# Patient Record
Sex: Male | Born: 1935 | Race: Black or African American | Hispanic: No | Marital: Married | Smoking: Former smoker
Health system: Southern US, Community
[De-identification: ages and names within clinical notes are randomized; demographics above are authoritative.]

## PROBLEM LIST (undated history)

## (undated) DIAGNOSIS — G3184 Mild cognitive impairment, so stated: Secondary | ICD-10-CM

## (undated) DIAGNOSIS — R011 Cardiac murmur, unspecified: Secondary | ICD-10-CM

## (undated) DIAGNOSIS — R569 Unspecified convulsions: Secondary | ICD-10-CM

## (undated) DIAGNOSIS — I10 Essential (primary) hypertension: Secondary | ICD-10-CM

## (undated) DIAGNOSIS — E785 Hyperlipidemia, unspecified: Secondary | ICD-10-CM

## (undated) HISTORY — PX: KNEE ARTHROSCOPY: SHX127

## (undated) HISTORY — DX: Essential (primary) hypertension: I10

## (undated) HISTORY — DX: Unspecified convulsions: R56.9

## (undated) HISTORY — DX: Hyperlipidemia, unspecified: E78.5

## (undated) HISTORY — DX: Mild cognitive impairment, so stated: G31.84

## (undated) HISTORY — PX: CARPAL TUNNEL RELEASE: SHX101

---

## 2001-12-09 ENCOUNTER — Encounter: Payer: Self-pay | Admitting: Internal Medicine

## 2001-12-09 LAB — CONVERTED CEMR LAB

## 2004-07-15 ENCOUNTER — Ambulatory Visit: Payer: Self-pay | Admitting: Internal Medicine

## 2005-01-19 ENCOUNTER — Ambulatory Visit: Payer: Self-pay | Admitting: Internal Medicine

## 2005-04-27 ENCOUNTER — Ambulatory Visit: Payer: Self-pay | Admitting: Internal Medicine

## 2005-07-01 ENCOUNTER — Ambulatory Visit: Payer: Self-pay | Admitting: Internal Medicine

## 2005-12-29 ENCOUNTER — Ambulatory Visit: Payer: Self-pay | Admitting: Internal Medicine

## 2006-01-05 ENCOUNTER — Ambulatory Visit: Payer: Self-pay | Admitting: Internal Medicine

## 2006-01-24 ENCOUNTER — Ambulatory Visit: Payer: Self-pay | Admitting: Gastroenterology

## 2006-02-01 ENCOUNTER — Ambulatory Visit: Payer: Self-pay | Admitting: Gastroenterology

## 2006-02-01 ENCOUNTER — Encounter: Payer: Self-pay | Admitting: Internal Medicine

## 2006-02-01 LAB — HM COLONOSCOPY

## 2006-05-11 ENCOUNTER — Ambulatory Visit: Payer: Self-pay | Admitting: Internal Medicine

## 2006-06-20 ENCOUNTER — Ambulatory Visit: Payer: Self-pay | Admitting: Internal Medicine

## 2006-06-20 LAB — CONVERTED CEMR LAB
ALT: 22 units/L (ref 0–40)
AST: 26 units/L (ref 0–37)
Albumin: 3.9 g/dL (ref 3.5–5.2)
Alkaline Phosphatase: 86 units/L (ref 39–117)
Bilirubin, Direct: 0.2 mg/dL (ref 0.0–0.3)
Chol/HDL Ratio, serum: 3.7
Cholesterol: 216 mg/dL (ref 0–200)
HDL: 58 mg/dL (ref >39.0)
LDL DIRECT: 121.9 mg/dL
Total Bilirubin: 0.8 mg/dL (ref 0.3–1.2)
Total Protein: 7.1 g/dL (ref 6.0–8.3)
Triglyceride fasting, serum: 104 mg/dL (ref 0–149)
VLDL: 21 mg/dL (ref 0–40)

## 2006-08-01 ENCOUNTER — Ambulatory Visit: Payer: Self-pay | Admitting: Internal Medicine

## 2006-08-01 LAB — CONVERTED CEMR LAB
ALT: 16 units/L (ref 0–40)
AST: 25 units/L (ref 0–37)
Albumin: 3.9 g/dL (ref 3.5–5.2)
Alkaline Phosphatase: 81 units/L (ref 39–117)
BUN: 11 mg/dL (ref 6–23)
Bilirubin, Direct: 0.1 mg/dL (ref 0.0–0.3)
CO2: 31 meq/L (ref 19–32)
Calcium: 9.6 mg/dL (ref 8.4–10.5)
Chloride: 103 meq/L (ref 96–112)
Cholesterol: 248 mg/dL (ref 0–200)
Creatinine, Ser: 1.1 mg/dL (ref 0.4–1.5)
Direct LDL: 108.7 mg/dL
GFR calc Af Amer: 85 mL/min
GFR calc non Af Amer: 70 mL/min
Glucose, Bld: 91 mg/dL (ref 70–99)
HDL: 53.1 mg/dL (ref >39.0)
Potassium: 5.3 meq/L — ABNORMAL HIGH (ref 3.5–5.1)
Sodium: 141 meq/L (ref 135–145)
Total Bilirubin: 0.7 mg/dL (ref 0.3–1.2)
Total CHOL/HDL Ratio: 4.7
Total Protein: 7.1 g/dL (ref 6.0–8.3)
Triglycerides: 294 mg/dL (ref 0–149)
VLDL: 59 mg/dL — ABNORMAL HIGH (ref 0–40)

## 2006-12-05 ENCOUNTER — Ambulatory Visit: Payer: Self-pay | Admitting: Internal Medicine

## 2006-12-05 LAB — CONVERTED CEMR LAB
ALT: 17 units/L (ref 0–40)
AST: 23 units/L (ref 0–37)
Albumin: 3.7 g/dL (ref 3.5–5.2)
Alkaline Phosphatase: 64 units/L (ref 39–117)
BUN: 9 mg/dL (ref 6–23)
Bilirubin, Direct: 0.1 mg/dL (ref 0.0–0.3)
CO2: 31 meq/L (ref 19–32)
Calcium: 9.2 mg/dL (ref 8.4–10.5)
Chloride: 106 meq/L (ref 96–112)
Cholesterol: 237 mg/dL (ref 0–200)
Creatinine, Ser: 1.1 mg/dL (ref 0.4–1.5)
Direct LDL: 122.9 mg/dL
GFR calc Af Amer: 85 mL/min
GFR calc non Af Amer: 70 mL/min
Glucose, Bld: 95 mg/dL (ref 70–99)
HDL: 56.1 mg/dL (ref >39.0)
Potassium: 5.3 meq/L — ABNORMAL HIGH (ref 3.5–5.1)
Sodium: 141 meq/L (ref 135–145)
Total Bilirubin: 0.8 mg/dL (ref 0.3–1.2)
Total CHOL/HDL Ratio: 4.2
Total Protein: 6.4 g/dL (ref 6.0–8.3)
Triglycerides: 179 mg/dL — ABNORMAL HIGH (ref 0–149)
VLDL: 36 mg/dL (ref 0–40)

## 2007-02-22 ENCOUNTER — Encounter: Payer: Self-pay | Admitting: Internal Medicine

## 2007-02-22 DIAGNOSIS — I10 Essential (primary) hypertension: Secondary | ICD-10-CM

## 2007-02-22 DIAGNOSIS — E785 Hyperlipidemia, unspecified: Secondary | ICD-10-CM

## 2007-02-22 HISTORY — DX: Essential (primary) hypertension: I10

## 2007-02-22 HISTORY — DX: Hyperlipidemia, unspecified: E78.5

## 2007-06-05 ENCOUNTER — Ambulatory Visit: Payer: Self-pay | Admitting: Internal Medicine

## 2007-06-05 LAB — CONVERTED CEMR LAB
ALT: 15 units/L (ref 0–53)
AST: 22 units/L (ref 0–37)
Albumin: 3.9 g/dL (ref 3.5–5.2)
Alkaline Phosphatase: 66 units/L (ref 39–117)
BUN: 17 mg/dL (ref 6–23)
Bilirubin, Direct: 0.1 mg/dL (ref 0.0–0.3)
CO2: 31 meq/L (ref 19–32)
Calcium: 9.5 mg/dL (ref 8.4–10.5)
Chloride: 105 meq/L (ref 96–112)
Cholesterol: 241 mg/dL (ref 0–200)
Creatinine, Ser: 1 mg/dL (ref 0.4–1.5)
Direct LDL: 150.5 mg/dL
GFR calc Af Amer: 94 mL/min
GFR calc non Af Amer: 78 mL/min
Glucose, Bld: 86 mg/dL (ref 70–99)
HDL: 64.4 mg/dL (ref >39.0)
Potassium: 5.5 meq/L — ABNORMAL HIGH (ref 3.5–5.1)
Sodium: 142 meq/L (ref 135–145)
Total Bilirubin: 0.8 mg/dL (ref 0.3–1.2)
Total CHOL/HDL Ratio: 3.7
Total Protein: 7 g/dL (ref 6.0–8.3)
Triglycerides: 87 mg/dL (ref 0–149)
VLDL: 17 mg/dL (ref 0–40)

## 2007-12-05 ENCOUNTER — Ambulatory Visit: Payer: Self-pay | Admitting: Internal Medicine

## 2007-12-24 LAB — CONVERTED CEMR LAB
ALT: 20 units/L (ref 0–53)
AST: 24 units/L (ref 0–37)
Bilirubin, Direct: 0.1 mg/dL (ref 0.0–0.3)
Total CHOL/HDL Ratio: 4.3
VLDL: 20 mg/dL (ref 0–40)

## 2008-05-30 ENCOUNTER — Ambulatory Visit: Payer: Self-pay | Admitting: Internal Medicine

## 2008-09-18 ENCOUNTER — Ambulatory Visit: Payer: Self-pay | Admitting: Internal Medicine

## 2008-09-18 LAB — CONVERTED CEMR LAB
Albumin: 3.9 g/dL (ref 3.5–5.2)
BUN: 13 mg/dL (ref 6–23)
Cholesterol: 213 mg/dL (ref 0–200)
Creatinine, Ser: 1.1 mg/dL (ref 0.4–1.5)
Direct LDL: 120.3 mg/dL
GFR calc Af Amer: 84 mL/min
Glucose, Bld: 88 mg/dL (ref 70–99)
Sodium: 142 meq/L (ref 135–145)
Total CHOL/HDL Ratio: 3.9
Total Protein: 7.2 g/dL (ref 6.0–8.3)
Triglycerides: 139 mg/dL (ref 0–149)
VLDL: 28 mg/dL (ref 0–40)

## 2008-09-25 ENCOUNTER — Ambulatory Visit: Payer: Self-pay | Admitting: Internal Medicine

## 2009-02-20 ENCOUNTER — Emergency Department (HOSPITAL_COMMUNITY): Admission: EM | Admit: 2009-02-20 | Discharge: 2009-02-20 | Payer: Self-pay | Admitting: Emergency Medicine

## 2009-02-21 ENCOUNTER — Ambulatory Visit: Payer: Self-pay | Admitting: Cardiology

## 2009-02-21 ENCOUNTER — Ambulatory Visit: Payer: Self-pay | Admitting: Internal Medicine

## 2009-02-21 ENCOUNTER — Inpatient Hospital Stay (HOSPITAL_COMMUNITY): Admission: EM | Admit: 2009-02-21 | Discharge: 2009-02-24 | Payer: Self-pay | Admitting: Emergency Medicine

## 2009-02-23 ENCOUNTER — Encounter: Payer: Self-pay | Admitting: Internal Medicine

## 2009-03-26 ENCOUNTER — Ambulatory Visit: Payer: Self-pay | Admitting: Internal Medicine

## 2009-03-30 LAB — CONVERTED CEMR LAB
Alkaline Phosphatase: 74 units/L (ref 39–117)
Cholesterol: 230 mg/dL — ABNORMAL HIGH (ref 0–200)
Direct LDL: 136.3 mg/dL
HDL: 56.3 mg/dL (ref >39.00)
Total Bilirubin: 0.9 mg/dL (ref 0.3–1.2)
Total CHOL/HDL Ratio: 4
Triglycerides: 111 mg/dL (ref 0.0–149.0)
VLDL: 22.2 mg/dL (ref 0.0–40.0)

## 2009-05-05 ENCOUNTER — Encounter: Payer: Self-pay | Admitting: Internal Medicine

## 2009-05-20 ENCOUNTER — Ambulatory Visit: Payer: Self-pay | Admitting: Internal Medicine

## 2009-08-27 ENCOUNTER — Telehealth: Payer: Self-pay | Admitting: Internal Medicine

## 2009-09-16 ENCOUNTER — Ambulatory Visit: Payer: Self-pay | Admitting: Family Medicine

## 2009-09-16 LAB — CONVERTED CEMR LAB
Calcium: 9.5 mg/dL (ref 8.4–10.5)
Creatinine, Ser: 1.1 mg/dL (ref 0.4–1.5)
GFR calc non Af Amer: 83.75 mL/min (ref >60)

## 2009-09-28 ENCOUNTER — Ambulatory Visit: Payer: Self-pay | Admitting: Internal Medicine

## 2009-10-02 LAB — CONVERTED CEMR LAB
AST: 22 units/L (ref 0–37)
Albumin: 3.9 g/dL (ref 3.5–5.2)
Cholesterol: 214 mg/dL — ABNORMAL HIGH (ref 0–200)
TSH: 1.23 microintl units/mL (ref 0.35–5.50)
Total Bilirubin: 0.7 mg/dL (ref 0.3–1.2)
Total CHOL/HDL Ratio: 3
Triglycerides: 181 mg/dL — ABNORMAL HIGH (ref 0.0–149.0)

## 2009-10-07 ENCOUNTER — Ambulatory Visit: Payer: Self-pay | Admitting: Internal Medicine

## 2009-10-07 ENCOUNTER — Telehealth: Payer: Self-pay | Admitting: Internal Medicine

## 2009-10-08 ENCOUNTER — Encounter (INDEPENDENT_AMBULATORY_CARE_PROVIDER_SITE_OTHER): Payer: Self-pay | Admitting: *Deleted

## 2009-10-08 ENCOUNTER — Ambulatory Visit: Payer: Self-pay | Admitting: Internal Medicine

## 2009-10-08 ENCOUNTER — Telehealth (INDEPENDENT_AMBULATORY_CARE_PROVIDER_SITE_OTHER): Payer: Self-pay | Admitting: *Deleted

## 2009-10-08 LAB — CONVERTED CEMR LAB
Basophils Relative: 0.5 % (ref 0.0–3.0)
Eosinophils Absolute: 0.2 10*3/uL (ref 0.0–0.7)
Eosinophils Relative: 2.5 % (ref 0.0–5.0)
HCT: 41.6 % (ref 39.0–52.0)
MCHC: 34.3 g/dL (ref 30.0–36.0)
MCV: 88.3 fL (ref 78.0–100.0)
Monocytes Relative: 9.4 % (ref 3.0–12.0)
Neutrophils Relative %: 56.2 % (ref 43.0–77.0)
Platelets: 295 10*3/uL (ref 150.0–400.0)
RDW: 13.7 % (ref 11.5–14.6)

## 2009-10-15 ENCOUNTER — Ambulatory Visit (HOSPITAL_COMMUNITY): Admission: RE | Admit: 2009-10-15 | Discharge: 2009-10-15 | Payer: Self-pay | Admitting: Gastroenterology

## 2009-10-15 ENCOUNTER — Ambulatory Visit: Payer: Self-pay | Admitting: Gastroenterology

## 2009-10-19 ENCOUNTER — Encounter: Payer: Self-pay | Admitting: Gastroenterology

## 2009-10-26 ENCOUNTER — Ambulatory Visit: Payer: Self-pay | Admitting: Internal Medicine

## 2009-11-11 ENCOUNTER — Encounter: Payer: Self-pay | Admitting: Internal Medicine

## 2009-11-30 ENCOUNTER — Ambulatory Visit: Payer: Self-pay | Admitting: Gastroenterology

## 2009-12-10 ENCOUNTER — Inpatient Hospital Stay (HOSPITAL_COMMUNITY): Admission: EM | Admit: 2009-12-10 | Discharge: 2009-12-11 | Payer: Self-pay | Admitting: Emergency Medicine

## 2009-12-11 ENCOUNTER — Encounter (INDEPENDENT_AMBULATORY_CARE_PROVIDER_SITE_OTHER): Payer: Self-pay | Admitting: Internal Medicine

## 2010-01-28 ENCOUNTER — Ambulatory Visit: Payer: Self-pay | Admitting: Internal Medicine

## 2010-02-01 LAB — CONVERTED CEMR LAB
ALT: 20 units/L (ref 0–53)
Albumin: 4 g/dL (ref 3.5–5.2)
Alkaline Phosphatase: 86 units/L (ref 39–117)
Cholesterol: 236 mg/dL — ABNORMAL HIGH (ref 0–200)
TSH: 1.96 microintl units/mL (ref 0.35–5.50)
Total Bilirubin: 0.8 mg/dL (ref 0.3–1.2)
Total Protein: 7.1 g/dL (ref 6.0–8.3)

## 2010-04-12 ENCOUNTER — Telehealth (INDEPENDENT_AMBULATORY_CARE_PROVIDER_SITE_OTHER): Payer: Self-pay | Admitting: *Deleted

## 2010-04-12 DIAGNOSIS — K862 Cyst of pancreas: Secondary | ICD-10-CM | POA: Insufficient documentation

## 2010-04-12 DIAGNOSIS — K863 Pseudocyst of pancreas: Secondary | ICD-10-CM

## 2010-04-15 ENCOUNTER — Encounter: Payer: Self-pay | Admitting: Internal Medicine

## 2010-04-15 ENCOUNTER — Telehealth: Payer: Self-pay | Admitting: Gastroenterology

## 2010-07-31 ENCOUNTER — Encounter: Payer: Self-pay | Admitting: Gastroenterology

## 2010-08-09 ENCOUNTER — Ambulatory Visit
Admission: RE | Admit: 2010-08-09 | Discharge: 2010-08-09 | Payer: Self-pay | Source: Home / Self Care | Attending: Internal Medicine | Admitting: Internal Medicine

## 2010-08-09 ENCOUNTER — Other Ambulatory Visit: Payer: Self-pay | Admitting: Internal Medicine

## 2010-08-09 LAB — HEPATIC FUNCTION PANEL
AST: 31 U/L (ref 0–37)
Alkaline Phosphatase: 80 U/L (ref 39–117)
Bilirubin, Direct: 0.1 mg/dL (ref 0.0–0.3)
Total Bilirubin: 0.9 mg/dL (ref 0.3–1.2)

## 2010-08-09 LAB — LIPID PANEL
Cholesterol: 262 mg/dL — ABNORMAL HIGH (ref 0–200)
Total CHOL/HDL Ratio: 4
Triglycerides: 206 mg/dL — ABNORMAL HIGH (ref 0.0–149.0)
VLDL: 41.2 mg/dL — ABNORMAL HIGH (ref 0.0–40.0)

## 2010-08-09 LAB — BASIC METABOLIC PANEL
CO2: 28 mEq/L (ref 19–32)
Creatinine, Ser: 1.4 mg/dL (ref 0.4–1.5)
Glucose, Bld: 89 mg/dL (ref 70–99)
Potassium: 4.7 mEq/L (ref 3.5–5.1)

## 2010-08-09 LAB — LDL CHOLESTEROL, DIRECT: Direct LDL: 158.6 mg/dL

## 2010-08-09 LAB — TSH: TSH: 1.68 u[IU]/mL (ref 0.35–5.50)

## 2010-08-10 NOTE — Assessment & Plan Note (Signed)
Summary: nausea/dm   Vital Signs:  Patient profile:   77 year old male Weight:      164 pounds Temp:     99.1 degrees F BP sitting:   150 / 82  (left arm) Cuff size:   regular  Vitals Entered By: Sid Falcon LPN (September 16, 1608 9:30 AM)  History of Present Illness: Acute visit. Patient seen with one week history of nausea but no vomiting. Followed by neurologist and recently placed on generic Aricept. Dosage increased to 10 mg near onset of nausea a little over one week ago. Patient denies any fever, chills, diarrhea, dysuria, or any localizing abdominal pain. Occasional constipation.  Other chronic problems include history of hyperlipidemia and hypertension. Other medications reviewed. Hepatic function normal September of last year. No headaches. ?hx seizures now off Keppra.  Allergies (verified): No Known Drug Allergies  Past History:  Past Medical History: Last updated: 03/26/2009 Hyperlipidemia Hypertension absence seizure---presumptive dx---hospitalized 2010  Social History: Last updated: 06/05/2007 Married Regular exercise-no PMH reviewed for relevance, PSH reviewed for relevance  Review of Systems  The patient denies weight loss, chest pain, syncope, dyspnea on exertion, peripheral edema, headaches, hemoptysis, melena, hematochezia, and severe indigestion/heartburn.    Physical Exam  General:  Well-developed,well-nourished,in no acute distress; alert,appropriate and cooperative throughout examination Ears:  External ear exam shows no significant lesions or deformities.  Otoscopic examination reveals clear canals, tympanic membranes are intact bilaterally without bulging, retraction, inflammation or discharge. Hearing is grossly normal bilaterally. Mouth:  Oral mucosa and oropharynx without lesions or exudates.  Teeth in good repair. Neck:  No deformities, masses, or tenderness noted. Lungs:  Normal respiratory effort, chest expands symmetrically. Lungs are clear  to auscultation, no crackles or wheezes. Heart:  normal rate and regular rhythm.   Abdomen:  soft, non-tender, normal bowel sounds, no distention, no masses, no guarding, no rigidity, no abdominal hernia, no hepatomegaly, and no splenomegaly.     Impression & Recommendations:  Problem # 1:  NAUSEA (ICD-787.02) Assessment New  suspect related to recent increased dosage of Aricept. Will have patient hold medication for now and touch base with neurologist for possible med change.. Check electrolytes  Orders: Venipuncture (96045) TLB-BMP (Basic Metabolic Panel-BMET) (80048-METABOL)  Complete Medication List: 1)  Lotrel 5-40 Mg Caps (Amlodipine besy-benazepril hcl) .[name] Kitchen.. 1 once daily 2)  Multivitamins Caps (Multiple vitamin) .... Once daily 3)  Aspirin 81 Mg Tbec (Aspirin) .... Once daily 4)  Simvastatin 40 Mg Tabs (Simvastatin) .... One tab by mouth daily 5)  Donepezil Hcl 10 Mg Tbdp (Donepezil hcl) .... One by mouth once daily  Patient Instructions: 1)  Hold Donepezil 10 mg tablet for now and touch base with your neurologist regarding other possible treatment options. 2)  Follow up with Dr Cato Mulligan in one week if nausea not resolving.

## 2010-08-10 NOTE — Miscellaneous (Signed)
Summary: Antibiotic  Clinical Lists Changes  Medications: Added new medication of AUGMENTIN 500-125 MG TABS (AMOXICILLIN-POT CLAVULANATE) 2 by mouth two times a day x 2 weeks - Signed Added new medication of CLARITHROMYCIN 500 MG TABS (CLARITHROMYCIN) 2 by mouth two times a day x 2 weeks - Signed Rx of AUGMENTIN 500-125 MG TABS (AMOXICILLIN-POT CLAVULANATE) 2 by mouth two times a day x 2 weeks;  #56 x 0;  Signed;  Entered by: Chales Abrahams CMA (AAMA);  Authorized by: Rachael Fee MD;  Method used: Electronically to [facility] [address], [place], [place], [place]  [address], Ph: [phone], Fax: [phone] Rx of CLARITHROMYCIN 500 MG TABS (CLARITHROMYCIN) 2 by mouth two times a day x 2 weeks;  #56 x 0;  Signed;  Entered by: Chales Abrahams CMA (AAMA);  Authorized by: Rachael Fee MD;  Method used: Electronically to [facility] [address], [place], [place], [place]  [address], Ph: [phone], Fax: [phone]    Prescriptions: CLARITHROMYCIN 500 MG TABS (CLARITHROMYCIN) 2 by mouth two times a day x 2 weeks  #56 x 0   Entered by:   Chales Abrahams CMA (AAMA)   Authorized by:   Rachael Fee MD   Signed by:   Chales Abrahams CMA (AAMA) on 10/19/2009   Method used:   Electronically to        [facility] (retail)       [address]       [place], [place]  [address]       Ph: [phone]       Fax: [phone]   RxID:   [id number] AUGMENTIN 500-125 MG TABS (AMOXICILLIN-POT CLAVULANATE) 2 by mouth two times a day x 2 weeks  #56 x 0   Entered by:   Chales Abrahams CMA (AAMA)   Authorized by:   Rachael Fee MD   Signed by:   Chales Abrahams CMA (AAMA) on 10/19/2009   Method used:   Electronically to        [facility] (retail)       [address]       [place], [place]  [address]       Ph: [phone]       Fax: [phone]   RxID:   [id number]

## 2010-08-10 NOTE — Procedures (Signed)
Summary: Endoscopic Ultrasound  Patient: Isaiah Davidson Note: All result statuses are Final unless otherwise noted.  Tests: (1) Endoscopic Ultrasound (EUS)  EUS Endoscopic Ultrasound                             DONE     [facility]     [address] [place], [place]  [address]           ENDOSCOPIC ULTRASOUND PROCEDURE REPORT           PATIENT:  [name], [name]  MR#:  [id number]     BIRTHDATE:  October 29, 1931  GENDER:  male     ENDOSCOPIST:  Rachael Fee, MD     REFERRED BY:  Birdie Sons, M.D.     PROCEDURE DATE:  10/15/2009     PROCEDURE:  Upper EUS     ASA CLASS:  Class II     INDICATIONS:  anorexia, weight loss, dyspepsia, dilated PD,     abnormal pancreas on CT; CA 19-9 normal, LFTs normal     MEDICATIONS:   Fentanyl 60 mcg IV, Versed 8 mg IV     DESCRIPTION OF PROCEDURE:   After the risks, benefits, and     alternatives of the procedure were thoroughly explained, informed     consent was obtained.  The  endoscope was introduced through the     mouth  and advanced to the duodenum.     <<PROCEDUREIMAGES>>           Endoscopic findings:     1. Normal esophagus     2. Moderate pan-gastritis.  Mucosal biopsies were taken and sent     for CLO, pathology to check for H. pylori.     3. Normal duodenum.           EUS findings:     1. Main pancreatic duct was normal in head.  In neck of pancreas     the PD became cystically dilated, focally (the cystic component     measured 6mm maximally).  There were no associated masses, nodules     near the cystic dilation of PC. The main PD in body, tail tapered     normally but was still very slightly dilated (up to 3mm in tail).     2. Pancreatic parenchyma in uncinate, head, neck, body and tail     was normal.  There were no discrete solid masses and no clear     signs of chronic pancreatitis.     3. No peripancreatic or celiac adenopathy.     4. CBD was normal, non-dilated and without filling defects.     5. Gallbladder  was normal.     6. Limited views of liver, spleen, portal and splenic vessels were     all normal.           Impression:     1. Focal cystic dilation of main pancreatic duct (up to 6mm     focally), otherwise normal examination.  No associated nodules,     solid masses, clear signs of chronic pancreatitis.  I do not think     this is source of his symptoms (anorexia, weight loss, dyspepsia).     2. Moderate pan gastritis.  Mucosal biopsies taken to check for H.     pylori.  If positive, will treat with appropriate antibiotics.     Will ask pt, family  about NSAID use.  He takes baby ASA daily and     no PPI, perhaps that is irritating stomach. Will start him on PPI     for now, Nexium called in.  He will take one pill 20-30 min before     breakfast meal daily.     3. Will probably arrange follow up MRI, MRCP in 6 months to check     for signficant interval change in main PD, cystic area.  Await H.     pylori testing and clinical response.           ______________________________     Rachael Fee, MD           n.     eSIGNED:   Rachael Fee at 10/15/2009 09:58 AM           Thomasene Lot, [id number]  Note: An exclamation mark (!) indicates a result that was not dispersed into the flowsheet. Document Creation Date: 10/15/2009 9:59 AM _______________________________________________________________________  (1) Order result status: Final Collection or observation date-time: 10/15/2009 09:44 Requested date-time:  Receipt date-time:  Reported date-time:  Referring Physician:   Ordering Physician: Rob Bunting [phone]) Specimen Source:  Source: Launa Grill Order Number: [id number] Lab site:

## 2010-08-10 NOTE — Progress Notes (Signed)
Summary: MRI   Phone Note Outgoing Call Call back at [facility] Phone [phone]   Call placed by: Chales Abrahams CMA Duncan Dull),  April 12, 2010 3:56 PM Summary of Call: pt scheduled for MRI needs to have creat before pt is aware Initial call taken by: Chales Abrahams CMA Duncan Dull),  April 12, 2010 3:56 PM  New Problems: CYST AND PSEUDOCYST OF PANCREAS (ICD-577.2)   New Problems: CYST AND PSEUDOCYST OF PANCREAS (ICD-577.2)

## 2010-08-10 NOTE — Procedures (Signed)
Summary: Colonoscopy/[facility]  Colonoscopy/[facility]   Imported By: Lanelle Bal 09/29/2009 09:39:34  _____________________________________________________________________  External Attachment:    Type:   Image     Comment:   External Document

## 2010-08-10 NOTE — Procedures (Signed)
Summary: Dr. Christella Hartigan endoscopy/ ultrasound   urease positive   Endoscopy Procedure - STATUS: Final  .                                         Perform Date: 7 Apr11 09:44  Ordered By: Hoy Finlay MD , PROVIDER         Ordered Date:  Facility: [facility]                              Department: ENDO  Service Report Text  Endoscopic Ultrasound    [facility]   [address] [place], [place] [address]    ENDOSCOPIC ULTRASOUND PROCEDURE REPORT    PATIENT: Isaiah Davidson, Isaiah Davidson MR#: [id number]   BIRTHDATE: 1932/01/29 GENDER: male   ENDOSCOPIST: Rachael Fee, MD   REFERRED BY: Birdie Sons, M.D.   PROCEDURE DATE: 10/15/2009   PROCEDURE: Upper EUS   ASA CLASS: Class II   INDICATIONS: anorexia, weight loss, dyspepsia, dilated PD,   abnormal pancreas on CT; CA 19-9 normal, LFTs normal   MEDICATIONS: Fentanyl 60 mcg IV, Versed 8 mg IV   DESCRIPTION OF PROCEDURE: After the risks, benefits, and   alternatives of the procedure were thoroughly explained, informed   consent was obtained. The endoscope was introduced through the   mouth and advanced to the duodenum.   <<PROCEDUREIMAGES>>    Endoscopic findings:   1. Normal esophagus   2. Moderate pan-gastritis. Mucosal biopsies were taken and sent   for CLO, pathology to check for H. pylori.   3. Normal duodenum.    EUS findings:   1. Main pancreatic duct was normal in head. In neck of pancreas   the PD became cystically dilated, focally (the cystic component   measured 6mm maximally). There were no associated masses, nodules   near the cystic dilation of PC. The main PD in body, tail tapered   normally but was still very slightly dilated (up to 3mm in tail).   2. Pancreatic parenchyma in uncinate, head, neck, body and tail   was normal. There were no discrete solid masses and no clear   signs of chronic pancreatitis.   3. No peripancreatic or celiac adenopathy.   4. CBD was normal, non-dilated and without filling defects.   5.  Gallbladder was normal.   6. Limited views of liver, spleen, portal and splenic vessels were   all normal.    Impression:   1. Focal cystic dilation of main pancreatic duct (up to 6mm   focally), otherwise normal examination. No associated nodules,   solid masses, clear signs of chronic pancreatitis. I do not think   this is source of his symptoms (anorexia, weight loss, dyspepsia).   2. Moderate pan gastritis. Mucosal biopsies taken to check for H.   pylori. If positive, will treat with appropriate antibiotics.   Will ask pt, family about NSAID use. He takes baby ASA daily and   no PPI, perhaps that is irritating stomach. Will start him on PPI   for now, Nexium called in. He will take one pill 20-30 min before   breakfast meal daily.   3. Will probably arrange follow up MRI, MRCP in 6 months to check   for signficant interval change in main PD, cystic area. Await H.   pylori testing and clinical response.  ______________________________   Rachael Fee, MD    n.   eSIGNED: Rachael Fee at 10/15/2009 09:58 AM    Thomasene Lot, [id number]  Additional Information  HL7 RESULT STATUS : F  External IF Update Timestamp : 2009-10-15:09:58:21.000000

## 2010-08-10 NOTE — Letter (Signed)
Summary: [facility]  [facility]   Imported By: Maryln Gottron 04/26/2010 10:03:22  _____________________________________________________________________  External Attachment:    Type:   Image     Comment:   External Document

## 2010-08-10 NOTE — Progress Notes (Signed)
Summary: MEDICATION INFO  Phone Note Call from Patient   Caller: Spouse [phone] Reason for Call: Talk to Nurse, Talk to Doctor Summary of Call: Pts wife called to adv that their insurance sent them a letter stating that they will not cover the pts med: AMLODIPINE ...... Instead they will cover the med: LOTREL...... Pt will need a RX for same to be sent to [facility] on [address].... Adv pt will need refill around September 17, 2009 per wife.   Initial call taken by: Debbra Riding,  August 27, 2009 12:35 PM  Follow-up for Phone Call        Have pt call their  pharmacy and have pharmacy  send [place] the denial and substitution their pharmacy wants.   We have no notice of this. Follow-up by: Gladis Riffle, RN,  August 27, 2009 1:56 PM  Additional Follow-up for Phone Call Additional follow up Details #1::        Phone Call Completed----Pts wife informed of Ellen's instruction.... acknowledged same.  Additional Follow-up by: Debbra Riding,  August 27, 2009 2:21 PM

## 2010-08-10 NOTE — Assessment & Plan Note (Signed)
  Review of gastrointestinal problems: 1. Dyspepsia, weight loss, anorexia 2011: likely from H. pylori positive gastritis noticed on EGD. Treatment with antibiotics completely resolved his symptoms. 2. incidental dilation of pancreatic duct. Endoscopic ultrasound April 2011 found no concerning findings. No recommended imaging or endoscopic surveillance.   History of Present Illness Visit Type: Follow-up Visit Primary GI MD: Rob Bunting MD Primary Provider: Birdie Sons, MD  Requesting Provider: na Chief Complaint: 5-6 week f/u  History of Present Illness:     very pleasant 77 -year-old man whom I last saw the time of his upper endoscopy with endoscopic ultrasound about 6 weeks ago. He is here with his wife today. Those results are summarized above. Since then he completed his H. pylori eradication treatment and feels much much better.  he is feeling better.  Like his "old self." He is eating normally without any problems at all.He has more energy.  His wife agrees completely.             Current Medications (verified): 1)  Multivitamins   Caps (Multiple Vitamin) .... Once Daily 2)  Aspirin 81 Mg  Tbec (Aspirin) .... Once Daily 3)  Simvastatin 40 Mg  Tabs (Simvastatin) .... One Tab By Mouth Daily 4)  Donepezil Hcl 10 Mg Tbdp (Donepezil Hcl) .... One By Mouth Once Daily 5)  Amlodipine Besylate 5 Mg Tabs (Amlodipine Besylate) .... Take 1 Tablet By Mouth Once A Day 6)  Benazepril Hcl 40 Mg Tabs (Benazepril Hcl) .... Take 1 Tablet By Mouth Once A Day  Allergies (verified): No Known Drug Allergies  Vital Signs:  Patient profile:   77 year old male Height:      68 inches Weight:      162 pounds BMI:     24.72 BSA:     1.87 Pulse rate:   72 / minute Pulse rhythm:   regular BP sitting:   132 / 80  (left arm) Cuff size:   regular  Vitals Entered By: Ok Anis CMA (Nov 30, 2009 8:30 AM)  Physical Exam  Additional Exam:  Constitutional: generally well appearing Psychiatric:  alert and oriented times 3 Abdomen: soft, non-tender, non-distended, normal bowel sounds    Impression & Recommendations:  Problem # 1:  H. pylori infection he feels much better after H. pylori eradication. All of his upper GI symptoms have completely resolved. I suspect that the pancreatic duct dilation that was noted on CT scan was completely incidental and unrelated to his symptoms. Given that the endoscopic ultrasound did not find any concerning associations, masses or lesions, I do not think he needs any dedicated imaging surveillance. He will however continue Nexium for another one to 2 months and try tapering off.  Patient Instructions: 1)  Continue on the nexium (refills were already called into your pharmacy) for at least 2 more months, then OK to try to stop. 2)  No need for repeat GI evaluation or follow-up for the incidental pancreatic duct dilation. 3)  A copy of this information will be sent to [facility]. 4)  The medication list was reviewed and reconciled.  All changed / newly prescribed medications were explained.  A complete medication list was provided to the patient / caregiver.

## 2010-08-10 NOTE — Miscellaneous (Signed)
Summary: rx  Clinical Lists Changes  Medications: Added new medication of NEXIUM 40 MG  CPDR (ESOMEPRAZOLE MAGNESIUM) 1 capsule each day 30 minutes before breakfast meal - Signed Rx of NEXIUM 40 MG  CPDR (ESOMEPRAZOLE MAGNESIUM) 1 capsule each day 30 minutes before breakfast meal;  #30 x 6;  Signed;  Entered by: Rachael Fee MD;  Authorized by: Rachael Fee MD;  Method used: Print then Give to Patient    Prescriptions: NEXIUM 40 MG  CPDR (ESOMEPRAZOLE MAGNESIUM) 1 capsule each day 30 minutes before breakfast meal  #30 x 6   Entered and Authorized by:   Rachael Fee MD   Signed by:   Rachael Fee MD on 10/15/2009   Method used:   Print then Give to Patient   RxID:   [id number]

## 2010-08-10 NOTE — Assessment & Plan Note (Signed)
Summary: 1 MONTH FUP//CCM   Vital Signs:  Patient profile:   77 year old male Weight:      164 pounds BMI:     25.03 Temp:     98.7 degrees F oral Pulse rate:   78 / minute Pulse rhythm:   regular Resp:     12 per minute BP sitting:   138 / 76  (left arm) Cuff size:   regular  Vitals Entered By: Gladis Riffle, RN (October 26, 2009 9:33 AM) CC: 1 month rov, dr Christella Hartigan put on two antibiotics for "stomach" last week Is Patient Diabetic? No   CC:  1 month rov and dr Christella Hartigan put on two antibiotics for "stomach" last week.  Preventive Screening-Counseling & Management  Alcohol-Tobacco     Smoking Status: quit     Year Quit: 40 years ago  Current Medications (verified): 1)  Multivitamins   Caps (Multiple Vitamin) .... Once Daily 2)  Aspirin 81 Mg  Tbec (Aspirin) .... Once Daily 3)  Simvastatin 40 Mg  Tabs (Simvastatin) .... One Tab By Mouth Daily 4)  Donepezil Hcl 10 Mg Tbdp (Donepezil Hcl) .... One By Mouth Once Daily 5)  Amlodipine Besylate 5 Mg Tabs (Amlodipine Besylate) .... Take 1 Tablet By Mouth Once A Day 6)  Benazepril Hcl 40 Mg Tabs (Benazepril Hcl) .... Take 1 Tablet By Mouth Once A Day 7)  Miralax   Powd (Polyethylene Glycol 3350) .[name] Kitchen.. 17g By Mouth Once Daily 8)  Nexium 40 Mg  Cpdr (Esomeprazole Magnesium) .[name] Kitchen.. 1 Capsule Each Day 30 Minutes Before Breakfast Meal 9)  Augmentin 500-125 Mg Tabs (Amoxicillin-Pot Clavulanate) .... 2 By Mouth Two Times A Day X 2 Weeks 10)  Clarithromycin 500 Mg Tabs (Clarithromycin) .... 2 By Mouth Two Times A Day X 2 Weeks  Allergies (verified): No Known Drug Allergies   Impression & Recommendations:  Problem # 1:  NONSPECIFIC ABN FINDING RAD & OTH EXAM GI TRACT (ICD-793.4) pancreatice mass reviewed endoscopy report and imported to chart reviewed with patient and wife  total time 20 minutes  Problem # 2:  HELICOBACTER PYLORI GASTRITIS (ICD-041.86) see meds total treatment 2 weeks   Complete Medication List: 1)  Multivitamins Caps  (Multiple vitamin) .... Once daily 2)  Aspirin 81 Mg Tbec (Aspirin) .... Once daily 3)  Simvastatin 40 Mg Tabs (Simvastatin) .... One tab by mouth daily 4)  Donepezil Hcl 10 Mg Tbdp (Donepezil hcl) .... One by mouth once daily 5)  Amlodipine Besylate 5 Mg Tabs (Amlodipine besylate) .... Take 1 tablet by mouth once a day 6)  Benazepril Hcl 40 Mg Tabs (Benazepril hcl) .... Take 1 tablet by mouth once a day 7)  Miralax Powd (Polyethylene glycol 3350) .[name] Kitchen.. 17g by mouth once daily 8)  Nexium 40 Mg Cpdr (Esomeprazole magnesium) .[name] Kitchen.. 1 capsule each day 30 minutes before breakfast meal 9)  Augmentin 500-125 Mg Tabs (Amoxicillin-pot clavulanate) .... 2 by mouth two times a day x 2 weeks 10)  Clarithromycin 500 Mg Tabs (Clarithromycin) .... 2 by mouth two times a day x 2 weeks  Patient Instructions: 1)  Please schedule a follow-up appointment in 3 months.

## 2010-08-10 NOTE — Assessment & Plan Note (Signed)
Summary: 6 MNTH ROV//[facility] BMP/NJR   Vital Signs:  Patient profile:   77 year old male Height:      68 inches Weight:      165 pounds BMI:     25.18 Temp:     98.2 degrees F oral Pulse rate:   58 / minute BP sitting:   158 / 84  (left arm) Cuff size:   regular  Vitals Entered By: Kern Reap CMA Duncan Dull) (September 28, 2009 8:22 AM) CC: follow-up visit   CC:  follow-up visit.  History of Present Illness: pt comes in wife for routine follow up but also has some complaints: (also reviewed neurolgoy note)  Still driving--memory is "good" (wife agrees)  Main concern of wife is decreased appetite and constipation for 3 weeks. says he gets early satiety and occasional nausea. reviewed dr burchette's note- stopped aricept and sxs perisisted  All other systems reviewed and were negative   Allergies: No Known Drug Allergies  Past History:  Past Medical History: Last updated: 03/26/2009 Hyperlipidemia Hypertension absence seizure---presumptive dx---hospitalized 2010  Past Surgical History: Last updated: 02/22/2007 knee surgery--arthroscopic  Family History: Last updated: 09/25/2008 noncontrib  Social History: Last updated: 06/05/2007 Married Regular exercise-no  Risk Factors: Exercise: no (06/05/2007)  Risk Factors: Smoking Status: quit (02/22/2007)  Physical Exam  General:  Well-developed,well-nourished,in no acute distress; alert,appropriate and cooperative throughout examination Head:  normocephalic and atraumatic.   Eyes:  pupils equal and pupils round.   Ears:  R ear normal and L ear normal.   Neck:  No deformities, masses, or tenderness noted. Chest Wall:  No deformities, masses, tenderness or gynecomastia noted. Lungs:  Normal respiratory effort, chest expands symmetrically. Lungs are clear to auscultation, no crackles or wheezes. Heart:  normal rate and regular rhythm.   Abdomen:  soft, non-tender, normal bowel sounds, no distention, no masses, no  guarding, no rigidity, no abdominal hernia, no hepatomegaly, and no splenomegaly.   Msk:  No deformity or scoliosis noted of thoracic or lumbar spine.   Neurologic:  cranial nerves II-XII intact and gait normal.   Skin:  turgor normal and color normal.   Psych:  normally interactive and good eye contact.     Impression & Recommendations:  Problem # 1:  CONSTIPATION (ICD-564.00)  associated with early satiety I have called for last colonoscopy report---after review will decide on course of action (colonoscopy +/- endo---may need GI appt) will check labs  Orders: Venipuncture ([address]) TLB-TSH (Thyroid Stimulating Hormone) (84443-TSH)  His updated medication list for this problem includes:    Miralax Powd (Polyethylene glycol 3350) .[name] KitchenMarland KitchenMarland KitchenMarland Kitchen 17g by mouth once daily  Problem # 2:  NAUSEA (ICD-787.02)  check labs may need imaging and EGD in addition to colonoscopy  Orders: Venipuncture ([address]) TLB-Amylase (82150-AMYL)  Problem # 3:  HYPERTENSION (ICD-401.9)  not as well controlled as I would like but I will not change meds at this time see me one month His updated medication list for this problem includes:    Amlodipine Besylate 5 Mg Tabs (Amlodipine besylate) .[name] Kitchen... Take 1 tablet by mouth once a day    Benazepril Hcl 40 Mg Tabs (Benazepril hcl) .[name] Kitchen... Take 1 tablet by mouth once a day  BP today: 158/84 Prior BP: 150/82 (09/16/2009)  Labs Reviewed: K+: 5.1 (09/16/2009) Creat: : 1.1 (09/16/2009)   Chol: 230 (03/26/2009)   HDL: 56.30 (03/26/2009)   LDL: DEL (09/18/2008)   TG: 111.0 (03/26/2009)  Orders: Venipuncture ([address]11)  Problem # 4:  HYPERLIPIDEMIA (ICD-272.4) will  check labs today   His updated medication list for this problem includes:    Simvastatin 40 Mg Tabs (Simvastatin) ..... One tab by mouth daily  Labs Reviewed: SGOT: 28 (03/26/2009)   SGPT: 22 (03/26/2009)   HDL:56.30 (03/26/2009), 54.2 (09/18/2008)  LDL:DEL (09/18/2008), DEL (12/05/2007)  Chol:230  (03/26/2009), 213 (09/18/2008)  Trig:111.0 (03/26/2009), 139 (09/18/2008)  Complete Medication List: 1)  Multivitamins Caps (Multiple vitamin) .... Once daily 2)  Aspirin 81 Mg Tbec (Aspirin) .... Once daily 3)  Simvastatin 40 Mg Tabs (Simvastatin) .... One tab by mouth daily 4)  Donepezil Hcl 10 Mg Tbdp (Donepezil hcl) .... One by mouth once daily 5)  Amlodipine Besylate 5 Mg Tabs (Amlodipine besylate) .... Take 1 tablet by mouth once a day 6)  Benazepril Hcl 40 Mg Tabs (Benazepril hcl) .... Take 1 tablet by mouth once a day 7)  Miralax Powd (Polyethylene glycol 3350) .[name] Kitchen.. 17g by mouth once daily  Other Orders: TLB-Lipid Panel (80061-LIPID) TLB-Hepatic/Liver Function Pnl (80076-HEPATIC)  Patient Instructions: 1)  Please schedule a follow-up appointment in 1 month. Prescriptions: MIRALAX   POWD (POLYETHYLENE GLYCOL 3350) 17g by mouth once daily  #1 container x 3   Entered and Authorized by:   Birdie Sons MD   Signed by:   Birdie Sons MD on 09/28/2009   Method used:   Electronically to        [facility] (retail)       [address]       [place], [place]  [address]       Ph: [phone]       Fax: [phone]   RxID:   [id number]

## 2010-08-10 NOTE — Progress Notes (Signed)
Summary: Cx appt 10-11   Phone Note Call from Patient Call back at Home Phone [phone]   Call For: Dr Christella Hartigan Summary of Call: Clayborne Dana he tells me you called and scheduled him to see DrJacobs on 10-11 but I do not see the appt. Per spouse he is at the hospital and they want to cx appt. Initial call taken by: Leanor Kail [facility],  April 15, 2010 11:44 AM  Follow-up for Phone Call        pt has been admitted to the hospital and wants to cx his MRI.  He will call when he is out of the hospital to reshcedule Follow-up by: Chales Abrahams CMA Duncan Dull),  April 15, 2010 12:46 PM

## 2010-08-10 NOTE — Progress Notes (Signed)
Summary: EUS   Phone Note Outgoing Call Call back at [facility] Phone [phone]   Call placed by: Chales Abrahams CMA Duncan Dull),  October 08, 2009 9:29 AM Summary of Call: EUS scheduled  call pt review meds and instruct pt.  left message with son Garrison [place] him call me. Initial call taken by: Chales Abrahams CMA Duncan Dull),  October 08, 2009 9:29 AM  Follow-up for Phone Call        pt returned call and was instructed and meds reviewed.  Pt will call with any questions. Follow-up by: Chales Abrahams CMA Duncan Dull),  October 08, 2009 9:59 AM  New Problems: NONSPECIFIC ABN FINDING RAD & OTH EXAM GI TRACT (ICD-793.4)   New Problems: NONSPECIFIC ABN FINDING RAD & OTH EXAM GI TRACT (ICD-793.4)

## 2010-08-10 NOTE — Progress Notes (Signed)
Summary: INFO CONCERNING CT REPORT  Phone Note From Other Clinic   Caller: Serita Kyle [facility] Summary of Call: Rose w/ [facility] called to adv that the CT report for this pt is ready to be viewed by Dr Cato Mulligan..... Radiologist req that Dr Cato Mulligan review the findings of the report.  Initial call taken by: Debbra Riding,  October 07, 2009 10:37 AM     Appended Document: INFO CONCERNING CT REPORT pt will be coming tomorrow to lab for  CA19-9/  CBC diff, Protime, Aptt  indication: pancreatic mass  Appended Document: INFO CONCERNING CT REPORT Labs scheduled.

## 2010-08-10 NOTE — Letter (Signed)
Summary: EGD Instructions  [facility]  [address] [place], [place] [address]   Phone: [phone]  Fax: [phone]       Isaiah Davidson    1932/03/23    MRN: [id number]       Procedure Day /Date:10/15/09     Arrival Time:9 am       Procedure Time:10 am     Location of Procedure:                     [facility] ( Outpatient Registration)   PREPARATION FOR ENDOSCOPY   On 10/15/09  THE DAY OF THE PROCEDURE:  1.   No solid foods, milk or milk products are allowed after midnight the night before your procedure.  2.   Do not drink anything colored red or purple.  Avoid juices with pulp.  No orange juice.  3.  You may drink clear liquids until 6 am, which is 4 hours before your procedure.                                                                                                CLEAR LIQUIDS INCLUDE: Water Jello Ice Popsicles Tea (sugar ok, no milk/cream) Powdered fruit flavored drinks Coffee (sugar ok, no milk/cream) Gatorade Juice: apple, white grape, white cranberry  Lemonade Clear bullion, consomm, broth Carbonated beverages (any kind) Strained chicken noodle soup Hard Candy   MEDICATION INSTRUCTIONS  Unless otherwise instructed, you should take regular prescription medications with a small sip of water as early as possible the morning of your procedure.               OTHER INSTRUCTIONS  You will need a responsible adult at least 75 years of age to accompany you and drive you home.   This person must remain in the waiting room during your procedure.  Wear loose fitting clothing that is easily removed.  Leave jewelry and other valuables at home.  However, you may wish to bring a book to read or an iPod/MP3 player to listen to music as you wait for your procedure to start.  Remove all body piercing jewelry and leave at home.  Total time from sign-in until discharge is approximately 2-3 hours.  You should go home directly  after your procedure and rest.  You can resume normal activities the day after your procedure.  The day of your procedure you should not:   Drive   Make legal decisions   Operate machinery   Drink alcohol   Return to work  You will receive specific instructions about eating, activities and medications before you leave.    The above instructions have been reviewed and explained to me by   Chales Abrahams CMA Duncan Dull)  October 08, 2009 9:32 AM      I fully understand and can verbalize these instructions over the phone mailed to home Date 10/08/09     Appended Document: EGD Instructions mailed to home  Appended Document: EGD Instructions mailed again to home after wife called to report that she has not received the  instructions... address verified with wife

## 2010-08-10 NOTE — Assessment & Plan Note (Signed)
Summary: 3 month fup//ccm/pt rescd from bump//ccm   Vital Signs:  Patient profile:   77 year old male Weight:      161 pounds Temp:     97.7 degrees F Pulse rate:   60 / minute Pulse rhythm:   regular Resp:     12 per minute BP sitting:   136 / 80  (left arm) Cuff size:   regular  Vitals Entered By: Gladis Riffle, RN (January 28, 2010 7:56 AM) CC: hospital FU--put on keppra and stopped donepezil and benazepril Is Patient Diabetic? No   Primary Care Provider:  Birdie Sons, MD   CC:  hospital FU--put on keppra and stopped donepezil and benazepril.  History of Present Illness:  Follow-Up Visit      This is a 77 year old man who presents for Follow-up visit.  The patient denies chest pain and palpitations.  Since the last visit the patient notes hospitalziation for seizure disorder.  The patient reports taking meds as prescribed.  When questioned about possible medication side effects, the patient notes none.    All other systems reviewed and were negative   Preventive Screening-Counseling & Management  Alcohol-Tobacco     Smoking Status: quit     Year Quit: 40 years ago  Current Problems (verified): 1)  Absence Seizure-presumptive Dx 2010  (ICD-345.00) 2)  Hypertension  (ICD-401.9) 3)  Hyperlipidemia  (ICD-272.4)  Current Medications (verified): 1)  Multivitamins   Caps (Multiple Vitamin) .... Once Daily 2)  Aspirin 81 Mg  Tbec (Aspirin) .... Once Daily 3)  Simvastatin 40 Mg  Tabs (Simvastatin) .... One Tab By Mouth Daily 4)  Amlodipine Besylate 5 Mg Tabs (Amlodipine Besylate) .... Take 1 Tablet By Mouth Once A Day 5)  Keppra 250 Mg Tabs (Levetiracetam) .... Take 1 Tablet By Mouth Once A Day  Allergies (verified): No Known Drug Allergies  Physical Exam  General:  Well-developed,well-nourished,in no acute distress; alert,appropriate and cooperative throughout examination Head:  normocephalic and atraumatic.   Eyes:  pupils equal and pupils round.   Ears:  R ear normal  and L ear normal.   Neck:  No deformities, masses, or tenderness noted. Chest Wall:  No deformities, masses, tenderness or gynecomastia noted. Lungs:  normal respiratory effort and no intercostal retractions.   Heart:  normal rate and regular rhythm.   Abdomen:  soft and non-tender.   Msk:  No deformity or scoliosis noted of thoracic or lumbar spine.   Neurologic:  cranial nerves II-XII intact and gait normal.     Impression & Recommendations:  Problem # 1:  ABSENCE SEIZURE-PRESUMPTIVE DX 2010 (ICD-345.00)  no recurrence---reviewed hospital records His updated medication list for this problem includes:    Keppra 250 Mg Tabs (Levetiracetam) .[name] Kitchen... Take 1 tablet by mouth once a day  Labs Reviewed: Hgb: 14.3 (10/08/2009)   Hct: 41.6 (10/08/2009)   WBC: 6.8 (10/08/2009)   RBC: 4.71 (10/08/2009)   Plts: 295.0 (10/08/2009) SGOT: 22 (09/28/2009)   SGPT: 21 (09/28/2009)   Amylase: 201 (09/28/2009)  Problem # 2:  HYPERTENSION (ICD-401.9)  adequate control continue current medications  The following medications were removed from the medication list:    Benazepril Hcl 40 Mg Tabs (Benazepril hcl) .[name] Kitchen... Take 1 tablet by mouth once a day His updated medication list for this problem includes:    Amlodipine Besylate 5 Mg Tabs (Amlodipine besylate) .[name] Kitchen... Take 1 tablet by mouth once a day  BP today: 136/80 Prior BP: 132/80 (11/30/2009)  Labs Reviewed: K+: 5.1 (  09/16/2009) Creat: : 1.1 (09/16/2009)   Chol: 214 (09/28/2009)   HDL: 64.70 (09/28/2009)   LDL: DEL (09/18/2008)   TG: 181.0 (09/28/2009)  Orders: Venipuncture ([address]) TLB-BMP (Basic Metabolic Panel-BMET) (80048-METABOL)  Problem # 3:  HYPERLIPIDEMIA (ICD-272.4)  check labs today continue current medications  His updated medication list for this problem includes:    Simvastatin 40 Mg Tabs (Simvastatin) ..... One tab by mouth daily  Labs Reviewed: SGOT: 22 (09/28/2009)   SGPT: 21 (09/28/2009)   HDL:64.70 (09/28/2009), 56.30  (03/26/2009)  LDL:DEL (09/18/2008), DEL (12/05/2007)  Chol:214 (09/28/2009), 230 (03/26/2009)  Trig:181.0 (09/28/2009), 111.0 (03/26/2009)  Orders: TLB-Lipid Panel (80061-LIPID) TLB-Hepatic/Liver Function Pnl (80076-HEPATIC) TLB-TSH (Thyroid Stimulating Hormone) (84443-TSH)  Complete Medication List: 1)  Multivitamins Caps (Multiple vitamin) .... Once daily 2)  Aspirin 81 Mg Tbec (Aspirin) .... Once daily 3)  Simvastatin 40 Mg Tabs (Simvastatin) .... One tab by mouth daily 4)  Amlodipine Besylate 5 Mg Tabs (Amlodipine besylate) .... Take 1 tablet by mouth once a day 5)  Keppra 250 Mg Tabs (Levetiracetam) .... Take 1 tablet by mouth once a day  Patient Instructions: 1)  Please schedule a follow-up appointment in 6 months.  Appended Document: 3 month fup//ccm/pt rescd from bump//ccm   Immunizations Administered:  Tetanus Vaccine:    Vaccine Type: Td    Site: left deltoid    Mfr: GlaxoSmithKline    Dose: 0.5 ml    Route: IM    Given by: Gladis Riffle, RN    Exp. Date: 07/24/2011    Lot #: [id number]

## 2010-08-18 ENCOUNTER — Ambulatory Visit (INDEPENDENT_AMBULATORY_CARE_PROVIDER_SITE_OTHER): Payer: PRIVATE HEALTH INSURANCE | Admitting: Internal Medicine

## 2010-08-18 ENCOUNTER — Telehealth: Payer: Self-pay | Admitting: *Deleted

## 2010-08-18 ENCOUNTER — Encounter: Payer: Self-pay | Admitting: Internal Medicine

## 2010-08-18 VITALS — BP 180/90 | HR 76 | Temp 97.7°F | Ht 69.0 in | Wt 180.0 lb

## 2010-08-18 DIAGNOSIS — G5600 Carpal tunnel syndrome, unspecified upper limb: Secondary | ICD-10-CM | POA: Insufficient documentation

## 2010-08-18 NOTE — Assessment & Plan Note (Signed)
Summary: 6 MONTH ROV.NJR/RSC PER DR/CJR   Vital Signs:  Patient profile:   77 year old male Weight:      175 pounds Temp:     98.8 degrees F oral Pulse rate:   68 / minute Pulse rhythm:   regular BP sitting:   172 / 100  (left arm) Cuff size:   regular  Vitals Entered By: Alfred Levins, CMA (August 09, 2010 8:46 AM) CC: f/u, hands tingling   Primary Care Provider:  Birdie Sons, MD   CC:  f/u and hands tingling.  History of Present Illness:  Follow-Up Visit      This is a 77 year old man who presents for Follow-up visit.  The patient denies chest pain and palpitations.  Since the last visit the patient notes no new problems or concerns he does note bilateral hand tingling R>L especially at night. Sxs resolve during the day i have also reviewed hospitalization from [facility].  The patient reports taking meds as prescribed.  When questioned about possible medication side effects, the patient notes none.    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 Once Daily 2)  Aspirin 81 Mg  Tbec (Aspirin) .... Once Daily 3)  Simvastatin 40 Mg  Tabs (Simvastatin) .... One Tab By Mouth Daily 4)  Amlodipine Besylate 5 Mg Tabs (Amlodipine Besylate) .... Take 1 Tablet By Mouth Once A Day 5)  Keppra 500 Mg Tabs (Levetiracetam) .... Take 1 Tablet By Mouth Two Times A Day For Seizure Control 6)  Aricept 5 Mg Tabs (Donepezil Hydrochloride) .[name] Kitchen.. 1 By Mouth Daily 7)  Chlorthalidone 25 Mg Tabs (Chlorthalidone) .[name] Kitchen.. 1 By Mouth Once Daily  Allergies (verified): No Known Drug Allergies  Past History:  Past Medical History: Last updated: 03/26/2009 Hyperlipidemia Hypertension absence seizure---presumptive  dx---hospitalized 2010  Past Surgical History: Last updated: 02/22/2007 knee surgery--arthroscopic  Family History: Last updated: 09/25/2008 noncontrib  Social History: Last updated: 06/05/2007 Married Regular exercise-no  Risk Factors: Exercise: no (06/05/2007)  Risk Factors: Smoking Status: quit (01/28/2010)  Physical Exam  General:  alert and well-developed.   Head:  normocephalic and atraumatic.   Eyes:  pupils equal and pupils round.   Ears:  R ear normal and L ear normal.   Neck:  No deformities, masses, or tenderness noted. Lungs:  normal respiratory effort and no intercostal retractions.   Abdomen:  soft and non-tender.   Msk:  No deformity or scoliosis noted of thoracic or lumbar spine.   Neurologic:  cranial nerves II-XII intact and gait normal.     Impression & Recommendations:  Problem # 1:  ABSENCE SEIZURE-PRESUMPTIVE DX 2010 (ICD-345.00) He has not had any recurrrent sxs after d/c from [facility] hospital His updated medication list for this problem includes:    Keppra 500 Mg Tabs (Levetiracetam) .[name] Kitchen... Take 1 tablet by mouth two times a day for seizure control  Problem # 2:  HYPERTENSION (ICD-401.9)  not as well controlled check labs today His updated medication list for this problem includes:    Amlodipine Besylate 10 Mg Tabs (Amlodipine besylate) .[name] Kitchen... Take 1 tablet by mouth once a day    Chlorthalidone 25 Mg Tabs (Chlorthalidone) .[name] Kitchen... 1 by mouth once daily  Orders: Venipuncture ([address]) TLB-BMP (  Basic Metabolic Panel-BMET) (80048-METABOL)  Problem # 3:  HYPERLIPIDEMIA (ICD-272.4)  check labs today His updated medication list for this problem includes:    Simvastatin 40 Mg Tabs (Simvastatin) ..... One tab by mouth daily  Labs Reviewed: SGOT: 26 (01/28/2010)   SGPT: 20 (01/28/2010)   HDL:61.70 (01/28/2010), 64.70 (09/28/2009)  LDL:DEL (09/18/2008), DEL (12/05/2007)  Chol:236 (01/28/2010), 214 (09/28/2009)  Trig:126.0 (01/28/2010), 181.0  (09/28/2009)  Orders: TLB-Lipid Panel (80061-LIPID) TLB-Hepatic/Liver Function Pnl (80076-HEPATIC) TLB-TSH (Thyroid Stimulating Hormone) (84443-TSH)  Complete Medication List: 1)  Multivitamins Caps (Multiple vitamin) .... Once daily 2)  Aspirin 81 Mg Tbec (Aspirin) .... Once daily 3)  Simvastatin 40 Mg Tabs (Simvastatin) .... One tab by mouth daily 4)  Amlodipine Besylate 10 Mg Tabs (Amlodipine besylate) .... Take 1 tablet by mouth once a day 5)  Keppra 500 Mg Tabs (Levetiracetam) .... Take 1 tablet by mouth two times a day for seizure control 6)  Aricept 5 Mg Tabs (Donepezil hydrochloride) .[name] Kitchen.. 1 by mouth daily 7)  Chlorthalidone 25 Mg Tabs (Chlorthalidone) .[name] Kitchen.. 1 by mouth once daily  Patient Instructions: 1)  see me one month Prescriptions: CHLORTHALIDONE 25 MG TABS (CHLORTHALIDONE) 1 by mouth once daily  #90 x 3   Entered and Authorized by:   Birdie Sons MD   Signed by:   Birdie Sons MD on 08/09/2010   Method used:   Electronically to        [facility] (retail)       [address]       [place], [place]  [address]       Ph: [phone]       Fax: [phone]   RxID:   [id number] AMLODIPINE BESYLATE 10 MG  TABS (AMLODIPINE BESYLATE) Take 1 tablet by mouth once a day  #90 x 3   Entered and Authorized by:   Birdie Sons MD   Signed by:   Birdie Sons MD on 08/09/2010   Method used:   Electronically to        [facility] (retail)       [address]       [place], [place]  [address]       Ph: [phone]       Fax: [phone]   RxID:   [id number]    Orders Added: 1)  Est. Patient Level IV [88416] 2)  Venipuncture [606[address]] 3)  TLB-BMP (Basic Metabolic Panel-BMET) [80048-METABOL] 4)  TLB-Lipid Panel [80061-LIPID] 5)  TLB-Hepatic/Liver Function Pnl [80076-HEPATIC] 6)  TLB-TSH (Thyroid Stimulating Hormone) [16[address]-XNA]  Appended Document: Orders Update     Clinical Lists  Changes  Orders: Added new Service order of Specimen Handling (35573) - Signed

## 2010-08-18 NOTE — Telephone Encounter (Signed)
Encounter opened in Error

## 2010-08-18 NOTE — Assessment & Plan Note (Addendum)
New diagnosis He will get wrist brace(s) and see if sxs resolve

## 2010-08-21 ENCOUNTER — Encounter: Payer: Self-pay | Admitting: Internal Medicine

## 2010-08-21 NOTE — Progress Notes (Signed)
  Subjective:    Patient ID: Isaiah Davidson, male    DOB: Sep 28, 1931, 77 y.o.   MRN: [id number]  HPI   patient comes in complaining of bilateral arm pain right greater than left. He describes a tingling discomfort of both arms right greater than left. Typically occurs at night in the vault the first 3 fingers. No real weakness but occasionally notes difficulty opening a bottle. Review of Systems  no other specific complaints.  Objective:  Physical Exam   well-developed well-nourished male. Chest clear to auscultation full range of motion of shoulders, elbows, wrists. Negative Tinel's and Phalen's signs.  Assessment & Plan:

## 2010-08-26 ENCOUNTER — Other Ambulatory Visit: Payer: Self-pay | Admitting: *Deleted

## 2010-08-26 MED ORDER — LEVETIRACETAM 500 MG PO TABS: 500.0000 mg | ORAL_TABLET | Freq: Two times a day (BID) | ORAL | Status: DC

## 2010-09-08 ENCOUNTER — Ambulatory Visit (INDEPENDENT_AMBULATORY_CARE_PROVIDER_SITE_OTHER): Payer: PRIVATE HEALTH INSURANCE | Admitting: Internal Medicine

## 2010-09-08 ENCOUNTER — Encounter: Payer: Self-pay | Admitting: Internal Medicine

## 2010-09-08 VITALS — BP 162/84 | HR 72 | Temp 98.3°F | Wt 178.0 lb

## 2010-09-08 DIAGNOSIS — G5600 Carpal tunnel syndrome, unspecified upper limb: Secondary | ICD-10-CM

## 2010-09-08 NOTE — Assessment & Plan Note (Signed)
Needs further evaluation Check NCS andthen consider hand surgery referral

## 2010-09-08 NOTE — Progress Notes (Signed)
  Subjective:    Patient ID: Isaiah Davidson, male    DOB: September 30, 1931, 77 y.o.   MRN: [id number]  HPI CTS: sxs better with brace but still with significant discomfort of hands/wrists  he still has significant tingling of the right hand greater than the left hand. Typically limited to the first 3 fingers of each hand. No weakness of either hand. No other specific complaints.  Past Medical History  Diagnosis Date  . HYPERLIPIDEMIA 02/22/2007  . HYPERTENSION 02/22/2007   Past Surgical History  Procedure Date  . Knee surgery     reports that he has quit smoking. His smoking use included Cigars. He does not have any smokeless tobacco history on file. He reports that he does not drink alcohol or use illicit drugs. family history includes Diabetes in his paternal grandmother.    No Known Allergies     Review of Systems  patient denies chest pain, shortness of breath, orthopnea. Denies lower extremity edema, abdominal pain, change in appetite, change in bowel movements. Patient denies rashes, musculoskeletal complaints. No other specific complaints in a complete review of systems.      Objective:   Physical Exam  Alert male in no acute distress. Full range of motion of both shoulders, elbows, wrists. Negative Phalen's and Tinel's signs.       Assessment & Plan:

## 2010-09-27 LAB — BASIC METABOLIC PANEL
BUN: 15 mg/dL (ref 6–23)
BUN: 15 mg/dL (ref 6–23)
CO2: 29 mEq/L (ref 19–32)
Calcium: 8.8 mg/dL (ref 8.4–10.5)
Creatinine, Ser: 0.98 mg/dL (ref 0.4–1.5)
GFR calc non Af Amer: >60 mL/min (ref >60)
Glucose, Bld: 102 mg/dL — ABNORMAL HIGH (ref 70–99)
Glucose, Bld: 121 mg/dL — ABNORMAL HIGH (ref 70–99)
Potassium: 3.6 mEq/L (ref 3.5–5.1)
Potassium: 4.4 mEq/L (ref 3.5–5.1)
Sodium: 137 mEq/L (ref 135–145)

## 2010-09-27 LAB — DIFFERENTIAL
Basophils Absolute: 0 10*3/uL (ref 0.0–0.1)
Basophils Relative: 0 % (ref 0–1)
Eosinophils Absolute: 0 10*3/uL (ref 0.0–0.7)
Eosinophils Relative: 0 % (ref 0–5)
Monocytes Absolute: 0.2 10*3/uL (ref 0.1–1.0)

## 2010-09-27 LAB — LIPID PANEL
HDL: 63 mg/dL (ref >39)
Total CHOL/HDL Ratio: 3.5 RATIO

## 2010-09-27 LAB — URINALYSIS, ROUTINE W REFLEX MICROSCOPIC
Bilirubin Urine: NEGATIVE
Glucose, UA: NEGATIVE mg/dL
Ketones, ur: NEGATIVE mg/dL
Nitrite: NEGATIVE
Specific Gravity, Urine: 1.028 (ref 1.005–1.030)
pH: 5.5 (ref 5.0–8.0)

## 2010-09-27 LAB — CBC
HCT: 32.8 % — ABNORMAL LOW (ref 39.0–52.0)
HCT: 40 % (ref 39.0–52.0)
Hemoglobin: 13.5 g/dL (ref 13.0–17.0)
MCHC: 33.8 g/dL (ref 30.0–36.0)
Platelets: 214 10*3/uL (ref 150–400)
Platelets: 255 10*3/uL (ref 150–400)
RDW: 13.3 % (ref 11.5–15.5)
RDW: 13.4 % (ref 11.5–15.5)

## 2010-09-27 LAB — URINE MICROSCOPIC-ADD ON

## 2010-09-27 LAB — CK TOTAL AND CKMB (NOT AT ARMC)
CK, MB: 1.7 ng/mL (ref 0.3–4.0)
Relative Index: 0.7 (ref 0.0–2.5)
Relative Index: 0.7 (ref 0.0–2.5)
Relative Index: 0.8 (ref 0.0–2.5)
Total CK: 240 U/L — ABNORMAL HIGH (ref 7–232)

## 2010-09-27 LAB — TROPONIN I
Troponin I: 0.03 ng/mL (ref 0.00–0.06)
Troponin I: <0.01 ng/mL (ref 0.00–0.06)

## 2010-09-27 LAB — URINE CULTURE: Colony Count: NO GROWTH

## 2010-09-29 LAB — CLOTEST (H. PYLORI), BIOPSY: Helicobacter screen: POSITIVE — AB

## 2010-10-16 LAB — RPR: RPR Ser Ql: NONREACTIVE

## 2010-10-17 LAB — COMPREHENSIVE METABOLIC PANEL
ALT: 20 U/L (ref 0–53)
AST: 27 U/L (ref 0–37)
CO2: 29 mEq/L (ref 19–32)
Calcium: 9.4 mg/dL (ref 8.4–10.5)
Chloride: 103 mEq/L (ref 96–112)
GFR calc Af Amer: >60 mL/min (ref >60)
GFR calc non Af Amer: >60 mL/min (ref >60)
Sodium: 137 mEq/L (ref 135–145)
Total Bilirubin: 0.8 mg/dL (ref 0.3–1.2)

## 2010-10-17 LAB — CBC
Hemoglobin: 14.2 g/dL (ref 13.0–17.0)
MCHC: 34.1 g/dL (ref 30.0–36.0)
MCV: 88.3 fL (ref 78.0–100.0)
RBC: 4.71 MIL/uL (ref 4.22–5.81)

## 2010-10-17 LAB — DIFFERENTIAL
Basophils Relative: 1 % (ref 0–1)
Eosinophils Absolute: 0 10*3/uL (ref 0.0–0.7)
Eosinophils Relative: 0 % (ref 0–5)
Monocytes Absolute: 0.5 10*3/uL (ref 0.1–1.0)
Monocytes Relative: 5 % (ref 3–12)

## 2010-10-17 LAB — POCT I-STAT, CHEM 8
BUN: 21 mg/dL (ref 6–23)
Calcium, Ion: 1.12 mmol/L (ref 1.12–1.32)
Chloride: 104 mEq/L (ref 96–112)
Glucose, Bld: 115 mg/dL — ABNORMAL HIGH (ref 70–99)

## 2010-10-17 LAB — CK TOTAL AND CKMB (NOT AT ARMC)
Relative Index: 1 (ref 0.0–2.5)
Total CK: 256 U/L — ABNORMAL HIGH (ref 7–232)

## 2010-10-17 LAB — TROPONIN I: Troponin I: 0.02 ng/mL (ref 0.00–0.06)

## 2010-10-17 LAB — TSH: TSH: 0.466 u[IU]/mL (ref 0.350–4.500)

## 2010-10-18 ENCOUNTER — Telehealth: Payer: Self-pay | Admitting: *Deleted

## 2010-10-18 DIAGNOSIS — G5600 Carpal tunnel syndrome, unspecified upper limb: Secondary | ICD-10-CM

## 2010-10-18 NOTE — Telephone Encounter (Signed)
Results are on your counter.

## 2010-10-18 NOTE — Telephone Encounter (Signed)
Make sure NCS ordered with neruology

## 2010-10-18 NOTE — Telephone Encounter (Signed)
It was ordered.  Attempting to find results.

## 2010-10-18 NOTE — Telephone Encounter (Signed)
Hands still are painful, and states they never got the results of his hand tests??? Neurology???

## 2010-10-21 ENCOUNTER — Other Ambulatory Visit: Payer: Self-pay | Admitting: Internal Medicine

## 2010-10-21 NOTE — Telephone Encounter (Signed)
Wife is calling back again for neurological study results.   He is still having hand pain.

## 2010-10-21 NOTE — Telephone Encounter (Signed)
Pt is aware of results, referral put in for orthopedic, pt aware
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
## 2010-11-23 NOTE — Discharge Summary (Signed)
NAME:  Isaiah Davidson, Isaiah Davidson NO.:  [id number]   MEDICAL RECORD NO.:  [id number]          PATIENT TYPE:  INP   LOCATION:  5524                         FACILITY:  [facility]   PHYSICIAN:  Lonia Blood, M.D.       DATE OF BIRTH:  [birth year]   DATE OF ADMISSION:  02/21/2009  DATE OF DISCHARGE:  02/24/2009                               DISCHARGE SUMMARY   PRIMARY CARE PHYSICIAN:  Valetta Mole. Swords, MD, with [facility].   DISCHARGE DIAGNOSES:  1. Episode of altered level of consciousness, query of seizure,      resolved and no recurrence.  2. Atherosclerosis of the intracranial circulation without any      significant stenoses.  3. Remote tobacco abuse.  4. Hypertension.  5. Hyperlipidemia  6. Osteoarthritis.   DISCHARGE MEDICATIONS:  1. Aspirin 325 mg daily.  2. Lotrel 5/20 mg daily.  3. Simvastatin 40 mg daily.  4. Keppra 250 mg twice a day.   CONDITION ON DISCHARGE:  The patient is discharged in good condition.  He is alert, oriented, neurologically intact.  He will follow up with  his primary care physician as previously scheduled and he will also  follow up with [facility] within 3 month to see if  he needs to continue the Keppra or it can be stopped.   CONSULTATION DURING THIS ADMISSION:  The patient was seen in  consultation by the for [facility], Dr. Noel Christmas and Dr. Porfirio Mylar Dohmeier.   PROCEDURE DURING THIS ADMISSION:  1. On February 21, 2009, the patient underwent an MRI of the brain,      findings of negative for acute stroke and positive for advanced      atrophy and small vessel disease.  2. MRA of the head, findings of intracranial atherosclerotic changes      without any flow-reducing lesions.  3. Carotid ultrasound which was negative for significant carotid      artery stenosis.  4. Transthoracic echocardiogram with findings of an ejection fraction      of 55-60% and no significant wall motion  abnormalities.  5. EEG with findings of no significant seizure activity.  6. Chest x-ray, PA and lateral, which was negative for acute      cardiopulmonary disease.   HISTORY AND PHYSICAL:  Refer to dictated H&P which was done by Dr. Eleonore Chiquito on February 20, 2009.   HOSPITAL COURSE:  Isaiah Davidson is a 77 year old gentleman with known  atherosclerosis, hypertension, and remote tobacco abuse who was brought  into the emergency room by his family after an episode of spacing out.  Based on his history and observation for 72 hours in the hospital, we  felt that he probably has suffered an absence-  type seizure.  The plan of care is to continue his home medications, add  low-dose Keppra, and perform mini-mental status examinations at his  followup visits.  The patient remained without any new events in the  hospital during the monitoring.      Lonia Blood, M.D.  Electronically Signed  SL/MEDQ  D:  02/24/2009  T:  02/24/2009  Job:  [id number]   cc:   Valetta Mole. Swords, MD  Melvyn Novas, M.D.
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
## 2010-11-23 NOTE — Procedures (Signed)
EEG NUMBER:  10 - [id number].   REQUESTING PHYSICIAN:  Gordy Savers, MD   ATTENDING PHYSICIAN:  [facility].   CLINICAL HISTORY:  A 77 year old male with altered mental status.  EEG  is for evaluation.   DESCRIPTION:  The dominant rhythm of this tracing is a moderate  amplitude alpha rhythm of 10-11 Hz which predominates posteriorly,  appears without abnormal asymmetry, and attenuates with eye opening and  closing.  Low amplitude fast activity is seen frontally and centrally  and appears without abnormal asymmetry.  No focal slowing is noted and  no epileptiform discharges were seen.  Drowsiness occurs naturally as  evidenced by fragmentation of the background and general attenuation of  rhythms.  Some stage II sleep is achieved in the middle of the record,  with appearance of vertex waves and sleep spindles.  No abnormalities  were seen in drowsiness or sleep.  Photic stimulation produced strong  symmetric driving responses.  Hyperventilation produced increased  prominence of the alpha, but no appearance of focal abnormality.  Single  channel devoted to EKG revealed sinus rhythm with rare PACs throughout  at a rate of approximately 60 beats per minute.   CONCLUSIONS:  Normal study in awake, drowsy, and brief sleep states.      Michael L. Thad Ranger, M.D.  Electronically Signed     ZOX:WRUE  D:  02/23/2009 16:25:04  T:  02/24/2009 02:14:18  Job #:  [id number]

## 2011-02-23 ENCOUNTER — Other Ambulatory Visit: Payer: Self-pay | Admitting: Internal Medicine

## 2011-04-19 ENCOUNTER — Encounter: Payer: Self-pay | Admitting: Internal Medicine

## 2011-04-19 ENCOUNTER — Ambulatory Visit (INDEPENDENT_AMBULATORY_CARE_PROVIDER_SITE_OTHER): Payer: PRIVATE HEALTH INSURANCE | Admitting: Internal Medicine

## 2011-04-19 VITALS — BP 134/72 | HR 68 | Temp 97.8°F | Ht 69.0 in | Wt 179.0 lb

## 2011-04-19 DIAGNOSIS — K862 Cyst of pancreas: Secondary | ICD-10-CM

## 2011-04-19 DIAGNOSIS — K863 Pseudocyst of pancreas: Secondary | ICD-10-CM

## 2011-04-19 DIAGNOSIS — Z23 Encounter for immunization: Secondary | ICD-10-CM

## 2011-04-19 DIAGNOSIS — E785 Hyperlipidemia, unspecified: Secondary | ICD-10-CM

## 2011-04-19 DIAGNOSIS — G40909 Epilepsy, unspecified, not intractable, without status epilepticus: Secondary | ICD-10-CM | POA: Insufficient documentation

## 2011-04-19 DIAGNOSIS — I10 Essential (primary) hypertension: Secondary | ICD-10-CM

## 2011-04-19 LAB — CBC WITH DIFFERENTIAL/PLATELET
Basophils Relative: 0.8 % (ref 0.0–3.0)
Eosinophils Relative: 2.5 % (ref 0.0–5.0)
HCT: 42.4 % (ref 39.0–52.0)
Hemoglobin: 13.9 g/dL (ref 13.0–17.0)
Lymphs Abs: 2.1 10*3/uL (ref 0.7–4.0)
MCV: 89.9 fl (ref 78.0–100.0)
Monocytes Relative: 9.2 % (ref 3.0–12.0)
Neutro Abs: 3.4 10*3/uL (ref 1.4–7.7)
Platelets: 356 10*3/uL (ref 150.0–400.0)
RBC: 4.72 Mil/uL (ref 4.22–5.81)
WBC: 6.3 10*3/uL (ref 4.5–10.5)

## 2011-04-19 LAB — BASIC METABOLIC PANEL
BUN: 20 mg/dL (ref 6–23)
Creatinine, Ser: 1.3 mg/dL (ref 0.4–1.5)
GFR: 70.65 mL/min (ref >60.00)

## 2011-04-19 LAB — LIPID PANEL
Cholesterol: 232 mg/dL — ABNORMAL HIGH (ref 0–200)
Total CHOL/HDL Ratio: 4
VLDL: 28.4 mg/dL (ref 0.0–40.0)

## 2011-04-19 LAB — LDL CHOLESTEROL, DIRECT: Direct LDL: 136.5 mg/dL

## 2011-04-19 LAB — HEPATIC FUNCTION PANEL
Bilirubin, Direct: 0 mg/dL (ref 0.0–0.3)
Total Bilirubin: 0.7 mg/dL (ref 0.3–1.2)

## 2011-04-19 LAB — TSH: TSH: 1.01 u[IU]/mL (ref 0.35–5.50)

## 2011-04-19 NOTE — Progress Notes (Signed)
  Subjective:    Patient ID: Isaiah Davidson, male    DOB: 05-29-1932, 77 y.o.   MRN: [id number]  HPI  cpx  Past Medical History  Diagnosis Date  . HYPERLIPIDEMIA 02/22/2007  . HYPERTENSION 02/22/2007   Past Surgical History  Procedure Date  . Knee surgery     reports that he has quit smoking. His smoking use included Cigars. He does not have any smokeless tobacco history on file. He reports that he does not drink alcohol or use illicit drugs. family history includes Diabetes in his paternal grandmother. No Known Allergies   Review of Systems  patient denies chest pain, shortness of breath, orthopnea. Denies lower extremity edema, abdominal pain, change in appetite, change in bowel movements. Patient denies rashes, musculoskeletal complaints. No other specific complaints in a complete review of systems.      Objective:   Physical Exam  Well-developed well-nourished male in no acute distress. HEENT exam atraumatic, normocephalic, extraocular muscles are intact. Neck is supple. No jugular venous distention no thyromegaly. Chest clear to auscultation without increased work of breathing. Cardiac exam S1 and S2 are regular. Abdominal exam active bowel sounds, soft, nontender. Extremities no edema. Neurologic exam she is alert without any motor sensory deficits. Gait is normal.     Assessment & Plan:  Well visit - health maint UTD

## 2011-04-21 ENCOUNTER — Other Ambulatory Visit: Payer: Self-pay | Admitting: Internal Medicine

## 2011-05-02 ENCOUNTER — Telehealth: Payer: Self-pay | Admitting: Internal Medicine

## 2011-05-02 NOTE — Telephone Encounter (Signed)
Pts spouse called and is req to get lab results and also find out when pt needs to come in again for ov?

## 2011-05-03 NOTE — Telephone Encounter (Signed)
Gave pt lab results and schedule lab appt

## 2011-05-04 ENCOUNTER — Other Ambulatory Visit (INDEPENDENT_AMBULATORY_CARE_PROVIDER_SITE_OTHER): Payer: PRIVATE HEALTH INSURANCE

## 2011-05-04 DIAGNOSIS — E8809 Other disorders of plasma-protein metabolism, not elsewhere classified: Secondary | ICD-10-CM

## 2011-05-10 LAB — SPEP & IFE WITH QIG
Albumin ELP: 55 % — ABNORMAL LOW (ref 55.8–66.1)
Alpha-1-Globulin: 5.6 % — ABNORMAL HIGH (ref 2.9–4.9)
Alpha-2-Globulin: 12.3 % — ABNORMAL HIGH (ref 7.1–11.8)
Beta 2: 5.4 % (ref 3.2–6.5)
Beta Globulin: 4.9 % (ref 4.7–7.2)
Gamma Globulin: 16.8 % (ref 11.1–18.8)
IgA: 139 mg/dL (ref 68–379)
IgG (Immunoglobin G), Serum: 1480 mg/dL (ref 650–1600)
IgM, Serum: 35 mg/dL — ABNORMAL LOW (ref 41–251)
Total Protein, Serum Electrophoresis: 7.4 g/dL (ref 6.0–8.3)

## 2011-08-05 ENCOUNTER — Other Ambulatory Visit: Payer: Self-pay | Admitting: Internal Medicine

## 2011-08-18 ENCOUNTER — Telehealth: Payer: Self-pay | Admitting: Family Medicine

## 2011-08-18 NOTE — Telephone Encounter (Signed)
6 mths with fasting labs

## 2011-08-18 NOTE — Telephone Encounter (Signed)
Arline Asp,  This patient called to find out when his next OV is supposed to be. Also, does he need labs? Please advise. Thanks!

## 2011-09-13 ENCOUNTER — Other Ambulatory Visit (INDEPENDENT_AMBULATORY_CARE_PROVIDER_SITE_OTHER): Payer: Self-pay

## 2011-09-13 DIAGNOSIS — E785 Hyperlipidemia, unspecified: Secondary | ICD-10-CM

## 2011-09-13 DIAGNOSIS — I10 Essential (primary) hypertension: Secondary | ICD-10-CM

## 2011-09-13 LAB — CBC WITH DIFFERENTIAL/PLATELET
Basophils Absolute: 0 10*3/uL (ref 0.0–0.1)
Eosinophils Absolute: 0.2 10*3/uL (ref 0.0–0.7)
HCT: 44.5 % (ref 39.0–52.0)
Hemoglobin: 14.9 g/dL (ref 13.0–17.0)
Lymphs Abs: 2.4 10*3/uL (ref 0.7–4.0)
MCHC: 33.6 g/dL (ref 30.0–36.0)
Monocytes Absolute: 0.7 10*3/uL (ref 0.1–1.0)
Neutro Abs: 3.6 10*3/uL (ref 1.4–7.7)
RDW: 14.1 % (ref 11.5–14.6)

## 2011-09-13 LAB — HEPATIC FUNCTION PANEL
ALT: 25 U/L (ref 0–53)
Alkaline Phosphatase: 79 U/L (ref 39–117)
Bilirubin, Direct: 0.1 mg/dL (ref 0.0–0.3)
Total Bilirubin: 0.5 mg/dL (ref 0.3–1.2)

## 2011-09-13 LAB — LIPID PANEL
HDL: 59.8 mg/dL (ref >39.00)
VLDL: 35.8 mg/dL (ref 0.0–40.0)

## 2011-09-13 LAB — BASIC METABOLIC PANEL
BUN: 20 mg/dL (ref 6–23)
Chloride: 96 mEq/L (ref 96–112)
Glucose, Bld: 91 mg/dL (ref 70–99)
Potassium: 4.8 mEq/L (ref 3.5–5.1)

## 2011-09-20 ENCOUNTER — Encounter: Payer: Self-pay | Admitting: Internal Medicine

## 2011-09-20 ENCOUNTER — Ambulatory Visit (INDEPENDENT_AMBULATORY_CARE_PROVIDER_SITE_OTHER): Payer: Medicare Other | Admitting: Internal Medicine

## 2011-09-20 VITALS — BP 136/84 | HR 80 | Temp 98.0°F | Ht 69.0 in | Wt 178.0 lb

## 2011-09-20 DIAGNOSIS — R0609 Other forms of dyspnea: Secondary | ICD-10-CM

## 2011-09-20 DIAGNOSIS — I10 Essential (primary) hypertension: Secondary | ICD-10-CM

## 2011-09-20 DIAGNOSIS — E785 Hyperlipidemia, unspecified: Secondary | ICD-10-CM

## 2011-09-20 DIAGNOSIS — R5381 Other malaise: Secondary | ICD-10-CM

## 2011-09-20 DIAGNOSIS — R5383 Other fatigue: Secondary | ICD-10-CM

## 2011-09-20 NOTE — Assessment & Plan Note (Signed)
Reasonable control, continue meds

## 2011-09-20 NOTE — Assessment & Plan Note (Addendum)
Well controlled Continue same meds   Pt has had significant dyspnea---multiple risk factors for CAD EKG with ? Anteroseptal infarct. Check myovies

## 2011-09-20 NOTE — Progress Notes (Signed)
Patient ID: Isaiah Davidson, male   DOB: [birth year], 78 y.o.   MRN: [id number]  htn---tolerating meds  Lipids- here for follow up  He describes a sensation of "exhaustion" if he works in the yard for 20 minutes---he denies chest pain, sob. sxs resolve in 3-5 minutes  Past Medical History  Diagnosis Date  . HYPERLIPIDEMIA 02/22/2007  . HYPERTENSION 02/22/2007    History   Social History  . Marital Status: Married    Spouse Name: N/A    Number of Children: N/A  . Years of Education: N/A   Occupational History  . Not on file.   Social History Main Topics  . Smoking status: Former Smoker -- 30 years    Types: Cigars  . Smokeless tobacco: Not on file  . Alcohol Use: No  . Drug Use: No  . Sexually Active:    Other Topics Concern  . Not on file   Social History Narrative  . No narrative on file    Past Surgical History  Procedure Date  . Knee surgery     Family History  Problem Relation Age of Onset  . Diabetes Paternal Grandmother     No Known Allergies  Current Outpatient Prescriptions on File Prior to Visit  Medication Sig Dispense Refill  . amLODipine (NORVASC) 10 MG tablet TAKE ONE TABLET BY MOUTH EVERY DAY  90 tablet  1  . aspirin 81 MG tablet Take 81 mg by mouth daily.        . chlorthalidone (HYGROTON) 25 MG tablet TAKE ONE TABLET BY MOUTH EVERY DAY  90 tablet  1  . donepezil (ARICEPT) 5 MG tablet Take 5 mg by mouth at bedtime.        . levETIRAcetam (KEPPRA) 500 MG tablet TAKE ONE TABLET BY MOUTH TWICE DAILY  60 tablet  5  . Multiple Vitamin (MULTIVITAMIN) capsule Take 1 capsule by mouth daily.        . simvastatin (ZOCOR) 40 MG tablet TAKE ONE TABLET BY MOUTH EVERY DAY  30 tablet  5     patient denies chest pain, shortness of breath, orthopnea. Denies lower extremity edema, abdominal pain, change in appetite, change in bowel movements. Patient denies rashes, musculoskeletal complaints. No other specific complaints in a complete review of systems.   BP  136/84  Pulse 80  Temp(Src) 98 F (36.7 C) (Oral)  Ht 5\' 9"  (1.753 m)  Wt 178 lb (80.74 kg)  BMI 26.29 kg/m2  well-developed well-nourished male in no acute distress. HEENT exam atraumatic, normocephalic, neck supple without jugular venous distention. Chest clear to auscultation cardiac exam S1-S2 are regular. Abdominal exam overweight with bowel sounds, soft and nontender. Extremities no edema. Neurologic exam is alert with a normal gait.

## 2011-09-23 ENCOUNTER — Encounter (HOSPITAL_COMMUNITY): Payer: Medicare Other

## 2011-09-27 ENCOUNTER — Other Ambulatory Visit (HOSPITAL_COMMUNITY): Payer: Self-pay | Admitting: Internal Medicine

## 2011-09-27 ENCOUNTER — Ambulatory Visit (HOSPITAL_COMMUNITY): Payer: Medicare Other

## 2011-09-27 DIAGNOSIS — R0602 Shortness of breath: Secondary | ICD-10-CM

## 2011-09-28 ENCOUNTER — Ambulatory Visit (HOSPITAL_COMMUNITY): Payer: Medicare Other | Attending: Cardiovascular Disease | Admitting: Radiology

## 2011-09-28 VITALS — BP 131/79 | Ht 69.0 in | Wt 174.0 lb

## 2011-09-28 DIAGNOSIS — R0609 Other forms of dyspnea: Secondary | ICD-10-CM | POA: Insufficient documentation

## 2011-09-28 DIAGNOSIS — Z87891 Personal history of nicotine dependence: Secondary | ICD-10-CM | POA: Insufficient documentation

## 2011-09-28 DIAGNOSIS — I4949 Other premature depolarization: Secondary | ICD-10-CM

## 2011-09-28 DIAGNOSIS — I10 Essential (primary) hypertension: Secondary | ICD-10-CM | POA: Insufficient documentation

## 2011-09-28 DIAGNOSIS — R9431 Abnormal electrocardiogram [ECG] [EKG]: Secondary | ICD-10-CM | POA: Insufficient documentation

## 2011-09-28 DIAGNOSIS — R0602 Shortness of breath: Secondary | ICD-10-CM

## 2011-09-28 DIAGNOSIS — R0989 Other specified symptoms and signs involving the circulatory and respiratory systems: Secondary | ICD-10-CM | POA: Insufficient documentation

## 2011-09-28 DIAGNOSIS — E785 Hyperlipidemia, unspecified: Secondary | ICD-10-CM | POA: Insufficient documentation

## 2011-09-28 DIAGNOSIS — R5381 Other malaise: Secondary | ICD-10-CM | POA: Insufficient documentation

## 2011-09-28 MED ORDER — TECHNETIUM TC 99M TETROFOSMIN IV KIT
30.0000 | PACK | Freq: Once | INTRAVENOUS | Status: AC | PRN
  Administered 2011-09-28: 30 via INTRAVENOUS

## 2011-09-28 MED ORDER — TECHNETIUM TC 99M TETROFOSMIN IV KIT
10.0000 | PACK | Freq: Once | INTRAVENOUS | Status: AC | PRN
  Administered 2011-09-28: 10 via INTRAVENOUS

## 2011-09-28 MED ORDER — REGADENOSON 0.4 MG/5ML IV SOLN
0.4000 mg | Freq: Once | INTRAVENOUS | Status: AC
  Administered 2011-09-28: 0.4 mg via INTRAVENOUS

## 2011-09-28 NOTE — Progress Notes (Signed)
[facility] SITE 3 NUCLEAR MED [address] [place] [address] [phone]  Cardiology Nuclear Med Study  Isaiah Davidson is a 78 y.o. male     MRN : [id number]     DOB: 1932/02/14  Procedure Date: 09/28/2011  Nuclear Med Background Indication for Stress Test:  Evaluation for Ischemia and Abnormal EKG History:  '10 Echo:EF=55-60% Cardiac Risk Factors: History of Smoking, Hypertension and Lipids  Symptoms:  DOE, Fatigue and Fatigue with Exertion   Nuclear Pre-Procedure Caffeine/Decaff Intake:  None NPO After: 8:00am   Lungs:  clear IV 0.9% NS with Angio Cath:  22g  IV Site: R Forearm  IV Started by:  Stanton Kidney, EMT-P  Chest Size (in):  40 Cup Size: n/a  Height: 5\' 9"  (1.753 m)  Weight:  174 lb (78.926 kg)  BMI:  Body mass index is 25.70 kg/(m^2). Tech Comments:  NA    Nuclear Med Study 1 or 2 day study: 1 day  Stress Test Type:  Treadmill/Lexiscan  Reading MD: Charlton Haws, MD  Order Authorizing Provider:  Birdie Sons, MD  Resting Radionuclide: Technetium 61m Tetrofosmin  Resting Radionuclide Dose: 10.2 mCi   Stress Radionuclide:  Technetium 55m Tetrofosmin  Stress Radionuclide Dose: 31.6 mCi           Stress Protocol Rest HR: 56 Stress HR: 115  Rest BP: 131/79 Stress BP: 205/79  Exercise Time (min): 8:30 METS: 7.0   Predicted Max HR: 143 bpm % Max HR: 80.42 bpm Rate Pressure Product: 21308   Dose of Adenosine (mg):  n/a Dose of Lexiscan: 0.4 mg  Dose of Atropine (mg): n/a Dose of Dobutamine: n/a mcg/kg/min (at max HR)  Stress Test Technologist: Smiley Houseman, CMA-N  Nuclear Technologist:  Domenic Polite, CNMT     Rest Procedure:  Myocardial perfusion imaging was performed at rest 45 minutes following the intravenous administration of Technetium 56m Tetrofosmin.  Rest ECG: Prior SWMI.  Stress Procedure: The patient attempted to walk on the treadmill for 6:30 minutes utilizing the Bruce protocol, but was unable to reach his target heart  rate and had to be changed to a Lexiscan. He then received IV Lexiscan 0.4 mg over 15-seconds with concurrent low level exercise and then Technetium 4m Tetrofosmin was injected at 30-seconds while the patient continued walking one more minute. There were nonspecific T-wave changes and occasional PVC's/PAC's with the Lexiscan bolus. He denied any chest pain.  He did had a hypertensive response immediately post Lexiscan, 205/79.  Quantitative spect images were obtained after a 45-minute delay.  Stress ECG: 1mm ST segment depression in lateral leads at peak exercise.  T wave inversions in inferolateral leads late in recovery  QPS Raw Data Images:  Normal; no motion artifact; normal heart/lung ratio. Stress Images:  Normal homogeneous uptake in all areas of the myocardium. Rest Images:  Normal homogeneous uptake in all areas of the myocardium. Subtraction (SDS):  Normal Transient Ischemic Dilatation (Normal <1.22):  0.94 Lung/Heart Ratio (Normal <0.45):  0.25  Quantitative Gated Spect Images QGS EDV:  77 ml QGS ESV:  29 ml  Impression Exercise Capacity:  Lexiscan with low level exercise. BP Response:  Hypertensive blood pressure response. Clinical Symptoms:  No chest pain. ECG Impression:  1 mm St segment depression in lateral leads at peak exercise and inferolateral T wave inversions late in recovery Comparison with Prior Nuclear Study: No previous nuclear study performed  Overall Impression:  Normal nuclear images.  Abnormal ECG response to exercise and lexiscan  given at 6:30 of stress test 1 mm ST segment depression at peak exercise and T wave inversions in inferolateral leads at peak exercise  LV Ejection Fraction: 62%.  LV Wall Motion:  NL LV Function; NL Wall Motion  Charlton Haws

## 2011-10-20 ENCOUNTER — Other Ambulatory Visit: Payer: Self-pay | Admitting: Internal Medicine

## 2011-12-07 ENCOUNTER — Encounter: Payer: Self-pay | Admitting: Gastroenterology

## 2011-12-07 ENCOUNTER — Ambulatory Visit (INDEPENDENT_AMBULATORY_CARE_PROVIDER_SITE_OTHER): Payer: Medicare Other | Admitting: Gastroenterology

## 2011-12-07 DIAGNOSIS — K5900 Constipation, unspecified: Secondary | ICD-10-CM

## 2011-12-07 DIAGNOSIS — R109 Unspecified abdominal pain: Secondary | ICD-10-CM

## 2011-12-07 MED ORDER — MOVIPREP 100 G PO SOLR: 1.0000 | ORAL | Status: DC

## 2011-12-07 NOTE — Patient Instructions (Signed)
Please start taking citrucel (orange flavored) powder fiber supplement.  This may cause some bloating at first but that usually goes away. Begin with a small spoonful and work your way up to a large, heaping spoonful daily over a week. You will be set up for a colonoscopy for change in bowel habits.

## 2011-12-07 NOTE — Progress Notes (Signed)
Review of gastrointestinal problems:  1. Dyspepsia, weight loss, anorexia 2011: likely from H. pylori positive gastritis noticed on EGD. Treatment with antibiotics completely resolved his symptoms.  2. incidental dilation of pancreatic duct. Endoscopic ultrasound April 2011 found no concerning findings. No recommended imaging or endoscopic surveillance.   HPI: This is a   very pleasant 78 year old man whom I last saw 2 or 3 years ago. He is here with a new complaint.  He has been having trouble with constipatoin. Has been going on for over a month.  Loud gurgling stomach.  Can have intermittent abd pains.  Will usually have BM every day but only with a lot of straining.  No blood in his stool.  Has been going on for quite a while.  He tried stool softners without much help.  Has never tried fiber or miralax.    Had a colonoscopy 9-10 years ago.    Had been gaining weight slowly.  Review of systems: Pertinent positive and negative review of systems were noted in the above HPI section. Complete review of systems was performed and was otherwise normal.    Past Medical History  Diagnosis Date  . HYPERLIPIDEMIA 02/22/2007  . HYPERTENSION 02/22/2007    Past Surgical History  Procedure Date  . Knee surgery   . Carpal tunnel release     Current Outpatient Prescriptions  Medication Sig Dispense Refill  . amLODipine (NORVASC) 10 MG tablet TAKE ONE TABLET BY MOUTH EVERY DAY  90 tablet  1  . aspirin 81 MG tablet Take 81 mg by mouth daily.        . chlorthalidone (HYGROTON) 25 MG tablet TAKE ONE TABLET BY MOUTH EVERY DAY  90 tablet  1  . Docusate Calcium (STOOL SOFTENER PO) Take by mouth as needed.      . donepezil (ARICEPT) 5 MG tablet Take 5 mg by mouth at bedtime.        . levETIRAcetam (KEPPRA) 500 MG tablet TAKE ONE TABLET BY MOUTH TWICE DAILY  60 tablet  5  . Multiple Vitamin (MULTIVITAMIN) capsule Take 1 capsule by mouth daily.        . simvastatin (ZOCOR) 40 MG tablet TAKE ONE TABLET  BY MOUTH EVERY DAY  30 tablet  5    Allergies as of 12/07/2011  . (No Known Allergies)    Family History  Problem Relation Age of Onset  . Diabetes Paternal Grandmother     History   Social History  . Marital Status: Married    Spouse Name: N/A    Number of Children: N/A  . Years of Education: N/A   Occupational History  . Not on file.   Social History Main Topics  . Smoking status: Former Smoker -- 30 years    Types: Cigars  . Smokeless tobacco: Never Used  . Alcohol Use: No  . Drug Use: No  . Sexually Active: Not on file   Other Topics Concern  . Not on file   Social History Narrative  . No narrative on file       Physical Exam: Ht 5\' 9"  (1.753 m)  Wt 180 lb (81.647 kg)  BMI 26.58 kg/m2 Constitutional: generally well-appearing Psychiatric: alert and oriented x3 Eyes: extraocular movements intact Mouth: oral pharynx moist, no lesions Neck: supple no lymphadenopathy Cardiovascular: heart regular rate and rhythm Lungs: clear to auscultation bilaterally Abdomen: soft, nontender, nondistended, no obvious ascites, no peritoneal signs, normal bowel sounds Extremities: no lower extremity edema bilaterally Skin: no lesions  on visible extremities    Assessment and plan: 78 y.o. male with chronic constipation, lower abdominal discomforts   His last colonoscopy was 9 or 10 years ago. Given his recent change in bowel habits I think we should repeat that now. In the meantime I suspect his constipation is probably functional related. He will start powder fiber supplements once daily and I suspect this will help move his bowels easier which will in turn helped some of his abdominal discomforts, upper GI complaints.

## 2011-12-09 ENCOUNTER — Encounter: Payer: Self-pay | Admitting: Gastroenterology

## 2011-12-09 ENCOUNTER — Ambulatory Visit (AMBULATORY_SURGERY_CENTER): Payer: Medicare Other | Admitting: Gastroenterology

## 2011-12-09 VITALS — BP 139/81 | HR 65 | Temp 97.2°F | Resp 18 | Ht 69.0 in | Wt 180.0 lb

## 2011-12-09 DIAGNOSIS — R109 Unspecified abdominal pain: Secondary | ICD-10-CM

## 2011-12-09 DIAGNOSIS — D126 Benign neoplasm of colon, unspecified: Secondary | ICD-10-CM

## 2011-12-09 DIAGNOSIS — K5730 Diverticulosis of large intestine without perforation or abscess without bleeding: Secondary | ICD-10-CM

## 2011-12-09 DIAGNOSIS — K5900 Constipation, unspecified: Secondary | ICD-10-CM

## 2011-12-09 MED ORDER — SODIUM CHLORIDE 0.9 % IV SOLN: 500.0000 mL | INTRAVENOUS | Status: DC

## 2011-12-09 NOTE — Progress Notes (Signed)
Patient did not experience any of the following events: a burn prior to discharge; a fall within the facility; wrong site/side/patient/procedure/implant event; or a hospital transfer or hospital admission upon discharge from the facility. (G8907) Patient did not have preoperative order for IV antibiotic SSI prophylaxis. (G8918)  

## 2011-12-09 NOTE — Patient Instructions (Addendum)

## 2011-12-09 NOTE — Op Note (Signed)
[facility] [address] [place], [place]  [address]  COLONOSCOPY PROCEDURE REPORT  PATIENT:  Isaiah, Davidson  MR#:  [id number] BIRTHDATE:  February 15, 1932, 79 yrs. old  GENDER:  male ENDOSCOPIST:  Rachael Fee, MD REF. BY:  Birdie Sons, M.D. PROCEDURE DATE:  12/09/2011 PROCEDURE:  Colonoscopy with biopsy ASA CLASS:  Class II INDICATIONS:  change in bowel habits MEDICATIONS:   Fentanyl 50 mcg IV, These medications were titrated to patient response per physician's verbal order, Versed 4 mg IV  DESCRIPTION OF PROCEDURE:   After the risks benefits and alternatives of the procedure were thoroughly explained, informed consent was obtained.  Digital rectal exam was performed and revealed no rectal masses.   The LB [id number] endoscope was introduced through the anus and advanced to the cecum, which was identified by both the appendix and ileocecal valve, without limitations.  The quality of the prep was good..  The instrument was then slowly withdrawn as the colon was fully examined. <<PROCEDUREIMAGES>> FINDINGS:  A diminutive polyp was found in the ascending colon. This was removed with biopsy forceps and sent to pathology (jar 1) (see image4).  Moderate diverticulosis was found throughout the colon (see image1 and image5).  This was otherwise a normal examination of the colon (see image6, image2, and image3). Retroflexed views in the rectum revealed no abnormalities. COMPLICATIONS:  None  ENDOSCOPIC IMPRESSION: 1) Diminutive polyp in the ascending colon; this was removed and sent to pathology 2) Moderate diverticulosis throughout the colon 3) Otherwise normal examination  RECOMMENDATIONS: 1) Given your age, you will not need another colonoscopy for colon cancer screening or polyp surveillance. These types of tests usually stop around the age 79. 2) You will receive a letter within 1-2 weeks with the results of your biopsy as well as final recommendations.  Please call my office if you have not received a letter after 3 weeks. 3) Please start once daily citrucel orange powder fiber supplement for you recent change in bowel habits.  ______________________________ Rachael Fee, MD  n. eSIGNED:   Rachael Fee at 12/09/2011 02:53 PM  Thomasene Lot, [phone]

## 2011-12-12 ENCOUNTER — Telehealth: Payer: Self-pay

## 2011-12-12 NOTE — Telephone Encounter (Signed)
  Follow up Call-  Call back number 12/09/2011  Post procedure Call Back phone  # [phone] or [phone]  Permission to leave phone message Yes     Patient questions:  Do you have a fever, pain , or abdominal swelling? no Pain Score  0 *  Have you tolerated food without any problems? yes  Have you been able to return to your normal activities? yes  Do you have any questions about your discharge instructions: Diet   no Medications  no Follow up visit  no  Do you have questions or concerns about your Care? no  Actions: * If pain score is 4 or above: No action needed, pain <4.

## 2011-12-14 ENCOUNTER — Encounter: Payer: Self-pay | Admitting: Gastroenterology

## 2012-02-01 ENCOUNTER — Other Ambulatory Visit: Payer: Self-pay | Admitting: Internal Medicine

## 2012-04-03 ENCOUNTER — Ambulatory Visit: Payer: Medicare Other | Admitting: Internal Medicine

## 2012-04-03 ENCOUNTER — Ambulatory Visit (INDEPENDENT_AMBULATORY_CARE_PROVIDER_SITE_OTHER): Payer: Medicare Other | Admitting: Internal Medicine

## 2012-04-03 ENCOUNTER — Encounter: Payer: Self-pay | Admitting: Internal Medicine

## 2012-04-03 VITALS — BP 142/88 | HR 64 | Temp 97.9°F | Wt 179.0 lb

## 2012-04-03 DIAGNOSIS — E785 Hyperlipidemia, unspecified: Secondary | ICD-10-CM

## 2012-04-03 DIAGNOSIS — Z23 Encounter for immunization: Secondary | ICD-10-CM

## 2012-04-03 DIAGNOSIS — I10 Essential (primary) hypertension: Secondary | ICD-10-CM

## 2012-04-03 DIAGNOSIS — G40909 Epilepsy, unspecified, not intractable, without status epilepticus: Secondary | ICD-10-CM

## 2012-04-06 NOTE — Progress Notes (Signed)
Patient ID: EISA GUERETTE, male   DOB: Sep 26, 1931, 78 y.o.   MRN: [id number] Patient comes in for followup. He is feeling well. He has a remote history of a seizure disorder without any recurrence. He has hypertension. No home blood pressures. He has hyperlipidemia and is tolerating simvastatin without difficulty.  Past Medical History  Diagnosis Date  . HYPERLIPIDEMIA 02/22/2007  . HYPERTENSION 02/22/2007  . Seizures     treated for siezures around 2010    History   Social History  . Marital Status: Married    Spouse Name: N/A    Number of Children: N/A  . Years of Education: N/A   Occupational History  . Not on file.   Social History Main Topics  . Smoking status: Former Smoker -- 30 years    Types: Cigars  . Smokeless tobacco: Never Used  . Alcohol Use: No  . Drug Use: No  . Sexually Active: Not on file   Other Topics Concern  . Not on file   Social History Narrative  . No narrative on file    Past Surgical History  Procedure Date  . Knee surgery   . Carpal tunnel release     Family History  Problem Relation Age of Onset  . Diabetes Paternal Grandmother     No Known Allergies  Current Outpatient Prescriptions on File Prior to Visit  Medication Sig Dispense Refill  . amLODipine (NORVASC) 10 MG tablet TAKE ONE TABLET BY MOUTH EVERY DAY  90 tablet  1  . aspirin 81 MG tablet Take 81 mg by mouth daily.        . chlorthalidone (HYGROTON) 25 MG tablet TAKE ONE TABLET BY MOUTH EVERY DAY  90 tablet  1  . Docusate Calcium (STOOL SOFTENER PO) Take by mouth as needed.      . donepezil (ARICEPT) 5 MG tablet Take 5 mg by mouth at bedtime.        . levETIRAcetam (KEPPRA) 500 MG tablet TAKE ONE TABLET BY MOUTH TWICE DAILY  60 tablet  5  . Multiple Vitamin (MULTIVITAMIN) capsule Take 1 capsule by mouth daily.        . simvastatin (ZOCOR) 40 MG tablet TAKE ONE TABLET BY MOUTH EVERY DAY  30 tablet  5     patient denies chest pain, shortness of breath, orthopnea. Denies  lower extremity edema, abdominal pain, change in appetite, change in bowel movements. Patient denies rashes, musculoskeletal complaints. No other specific complaints in a complete review of systems.   BP 142/88  Pulse 64  Temp 97.9 F (36.6 C) (Oral)  Wt 179 lb (81.194 kg)  well-developed well-nourished male in no acute distress. HEENT exam atraumatic, normocephalic, neck supple without jugular venous distention. Chest clear to auscultation cardiac exam S1-S2 are regular. Abdominal exam overweight with bowel sounds, soft and nontender. Extremities no edema. Neurologic exam is alert with a normal gait.

## 2012-04-06 NOTE — Assessment & Plan Note (Signed)
BP Readings from Last 3 Encounters:  04/03/12 142/88  12/09/11 139/81  12/07/11 128/64   Adequate control. Continue current medications.

## 2012-04-06 NOTE — Assessment & Plan Note (Signed)
No recurrence. 

## 2012-04-06 NOTE — Assessment & Plan Note (Signed)
Lipid Panel     Component Value Date/Time   CHOL 235* 09/13/2011 1037   TRIG 179.0* 09/13/2011 1037   HDL 59.80 09/13/2011 1037   CHOLHDL 4 09/13/2011 1037   VLDL 35.8 09/13/2011 1037   LDLCALC  Value: 135        Total Cholesterol/HDL:CHD Risk Coronary Heart Disease Risk Table                     Men   Women  1/2 Average Risk   3.4   3.3  Average Risk       5.0   4.4  2 X Average Risk   9.6   7.1  3 X Average Risk  23.4   11.0        Use the calculated Patient Ratio above and the CHD Risk Table to determine the patient's CHD Risk.        ATP III CLASSIFICATION (LDL):  <100     mg/dL   Optimal  829-562  mg/dL   Near or Above                    Optimal  130-159  mg/dL   Borderline  130-865  mg/dL   High  >784     mg/dL   Very High* 12/17/6293 2841    Reviewing his last lipid panel I think we should try a different medication. I'll contact the patient and change his simvastatin to Lipitor 40 mg by mouth daily.

## 2012-04-19 ENCOUNTER — Other Ambulatory Visit: Payer: Self-pay | Admitting: Internal Medicine

## 2012-07-30 ENCOUNTER — Telehealth: Payer: Self-pay | Admitting: Internal Medicine

## 2012-07-30 ENCOUNTER — Encounter: Payer: Self-pay | Admitting: Family Medicine

## 2012-07-30 ENCOUNTER — Ambulatory Visit (INDEPENDENT_AMBULATORY_CARE_PROVIDER_SITE_OTHER): Payer: Medicare Other | Admitting: Family Medicine

## 2012-07-30 ENCOUNTER — Ambulatory Visit: Payer: Self-pay | Admitting: Internal Medicine

## 2012-07-30 VITALS — BP 140/80 | HR 67 | Temp 98.1°F | Wt 185.0 lb

## 2012-07-30 DIAGNOSIS — J069 Acute upper respiratory infection, unspecified: Secondary | ICD-10-CM

## 2012-07-30 NOTE — Progress Notes (Signed)
Chief Complaint  Patient presents with  . Cough    congestion, fever body aches, stomach pain, chills     HPI:  Urgent Care Visit for cough: -started: 2 days ago -symptoms:nasal congestion, sore throat, cough, tactile fever, body aches -denies: objective fever, SOB, NVD, tooth pain, ear pain -has tried: has tried coricidin -sick contacts: son has a "cold" -Hx of: no hx of lung disease   ROS: See pertinent positives and negatives per HPI.  Past Medical History  Diagnosis Date  . HYPERLIPIDEMIA 02/22/2007  . HYPERTENSION 02/22/2007  . Seizures     treated for siezures around 2010    Family History  Problem Relation Age of Onset  . Diabetes Paternal Grandmother     History   Social History  . Marital Status: Married    Spouse Name: N/A    Number of Children: N/A  . Years of Education: N/A   Social History Main Topics  . Smoking status: Former Smoker -- 30 years    Types: Cigars  . Smokeless tobacco: Never Used  . Alcohol Use: No  . Drug Use: No  . Sexually Active: None   Other Topics Concern  . None   Social History Narrative  . None    Current outpatient prescriptions:amLODipine (NORVASC) 10 MG tablet, TAKE ONE TABLET BY MOUTH EVERY DAY, Disp: 90 tablet, Rfl: 1;  aspirin 81 MG tablet, Take 81 mg by mouth daily.  , Disp: , Rfl: ;  chlorthalidone (HYGROTON) 25 MG tablet, TAKE ONE TABLET BY MOUTH EVERY DAY, Disp: 90 tablet, Rfl: 1;  Docusate Calcium (STOOL SOFTENER PO), Take by mouth as needed., Disp: , Rfl:  donepezil (ARICEPT) 5 MG tablet, Take 5 mg by mouth at bedtime.  , Disp: , Rfl: ;  levETIRAcetam (KEPPRA) 500 MG tablet, TAKE ONE TABLET BY MOUTH TWICE DAILY, Disp: 60 tablet, Rfl: 5;  Multiple Vitamin (MULTIVITAMIN) capsule, Take 1 capsule by mouth daily.  , Disp: , Rfl: ;  simvastatin (ZOCOR) 40 MG tablet, TAKE ONE TABLET BY MOUTH EVERY DAY, Disp: 30 tablet, Rfl: 4  EXAM:  Filed Vitals:   07/30/12 1419  BP: 140/80  Pulse: 67  Temp: 98.1 F (36.7 C)     There is no height on file to calculate BMI.  GENERAL: vitals reviewed and listed above, alert, oriented, appears well hydrated and in no acute distress  HEENT: atraumatic, conjunttiva clear, no obvious abnormalities on inspection of external nose and ears, normal appearance of ear canals and TMs, clear nasal congestion, mild post oropharyngeal erythema with PND, no tonsillar edema or exudate, no sinus TTP  NECK: no obvious masses on inspection  LUNGS: clear to auscultation bilaterally, no wheezes, rales or rhonchi, good air movement  CV: HRRR, no peripheral edema  MS: moves all extremities without noticeable abnormality  PSYCH: pleasant and cooperative, no obvious depression or anxiety  ASSESSMENT AND PLAN:  Discussed the following assessment and plan:  1. Upper respiratory infection    -likely viral, appears well, normal lung exam - supportive care and return precuations -Patient advised to return or notify a doctor immediately if symptoms worsen or persist or new concerns arise.  Patient Instructions  INSTRUCTIONS FOR UPPER RESPIRATORY INFECTION:  -plenty of rest and fluids  -nasal saline wash 2-3 times daily (use prepackaged nasal saline or bottled/distilled water if making your own)   -can use sinex nasal spray for drainage and nasal congestion - but do NOT use longer then 3-4 days  -can use tylenol or  ibuprofen as directed for aches and sorethroat  -in the winter time, using a humidifier at night is helpful (please follow cleaning instructions)  -if you are taking a cough medication - use only as directed, may also try a teaspoon of honey to coat the throat and throat lozenges  -for sore throat, salt water gargles can help  -follow up if you have fevers, facial pain, tooth pain, difficulty breathing or are worsening or not getting better in 5-7 days      Jayna Mulnix, Dahlia Client R.

## 2012-07-30 NOTE — Telephone Encounter (Signed)
Patient Information:  Caller Name: [name]  Phone: [phone]  Patient: Isaiah Davidson, Isaiah Davidson  Gender: Male  DOB: [birth year]  Age: 79 Years  PCP: Birdie Sons (Adults only)  Office Follow Up:  Does the office need to follow up with this patient?: No  Instructions For The Office: N/A   Symptoms  Reason For Call & Symptoms: Cough, congestion.  Fever/tactile for over 3 days.  .  Yellowish sputum.  Reviewed Health History In EMR: Yes  Reviewed Medications In EMR: Yes  Reviewed Allergies In EMR: Yes  Reviewed Surgeries / Procedures: Yes  Date of Onset of Symptoms: 07/29/2012  Any Fever: Yes  Fever Taken: Tactile  Fever Time Of Reading: 08:30:00  Fever Last Reading: N/A  Guideline(s) Used:  Cough  Disposition Per Guideline:   See Today in Office  Reason For Disposition Reached:   Fever present > 3 days (72 hours)  Advice Given:  Reassurance  Coughing is the way that our lungs remove irritants and mucus. It helps protect our lungs from getting pneumonia.  Cough Medicines:  Home Remedy - Hard Candy: Hard candy works just as well as medicine-flavored OTC cough drops. Diabetics should use sugar-free candy.  Coughing Spasms:  Drink warm fluids. Inhale warm mist (Reason: both relax the airway and loosen up the phlegm).  Suck on cough drops or hard candy to coat the irritated throat.  Prevent Dehydration:  Drink adequate liquids.  This will help soothe an irritated or dry throat and loosen up the phlegm.  Avoid Tobacco Smoke:  Smoking or being exposed to smoke makes coughs much worse.  Fever Medicines:  For fevers above 101 F (38.3 C) take either acetaminophen or ibuprofen.  Call Back If:  Difficulty breathing  Cough lasts more than 3 weeks  You become worse.  Appointment Scheduled:  07/30/2012 14:00:52 Appointment Scheduled Provider:  Artist Pais, Doe-Hyun Molly Maduro) (Adults only)

## 2012-07-30 NOTE — Patient Instructions (Addendum)

## 2012-08-02 ENCOUNTER — Other Ambulatory Visit: Payer: Self-pay | Admitting: Internal Medicine

## 2012-09-12 ENCOUNTER — Other Ambulatory Visit: Payer: Self-pay | Admitting: Internal Medicine

## 2012-09-30 NOTE — Assessment & Plan Note (Signed)
No recurrent sxs 

## 2012-09-30 NOTE — Assessment & Plan Note (Signed)
Will check bmet today 

## 2012-09-30 NOTE — Progress Notes (Signed)
htn- no home bps  Lipids- tolerating meds  sz disorder - no recurrence on meds  One week hx of right shoulder aching-- started after shoveling snow and doing yard work.   Shortness of breath > 1 year- reviewed stress test  Past Medical History  Diagnosis Date  . HYPERLIPIDEMIA 02/22/2007  . HYPERTENSION 02/22/2007  . Seizures     treated for siezures around 2010    History   Social History  . Marital Status: Married    Spouse Name: N/A    Number of Children: N/A  . Years of Education: N/A   Occupational History  . Not on file.   Social History Main Topics  . Smoking status: Former Smoker -- 30 years    Types: Cigars  . Smokeless tobacco: Never Used  . Alcohol Use: No  . Drug Use: No  . Sexually Active: Not on file   Other Topics Concern  . Not on file   Social History Narrative  . No narrative on file    Past Surgical History  Procedure Laterality Date  . Knee surgery    . Carpal tunnel release      Family History  Problem Relation Age of Onset  . Diabetes Paternal Grandmother     No Known Allergies  Current Outpatient Prescriptions on File Prior to Visit  Medication Sig Dispense Refill  . amLODipine (NORVASC) 10 MG tablet TAKE ONE TABLET BY MOUTH EVERY DAY  90 tablet  0  . aspirin 81 MG tablet Take 81 mg by mouth daily.        . chlorthalidone (HYGROTON) 25 MG tablet TAKE ONE TABLET BY MOUTH EVERY DAY  90 tablet  0  . Docusate Calcium (STOOL SOFTENER PO) Take by mouth as needed.      . donepezil (ARICEPT) 5 MG tablet Take 5 mg by mouth at bedtime.        . levETIRAcetam (KEPPRA) 500 MG tablet TAKE ONE TABLET BY MOUTH TWICE DAILY  60 tablet  5  . Multiple Vitamin (MULTIVITAMIN) capsule Take 1 capsule by mouth daily.        . simvastatin (ZOCOR) 40 MG tablet TAKE ONE TABLET BY MOUTH EVERY DAY  30 tablet  0   No current facility-administered medications on file prior to visit.     patient denies chest pain, shortness of breath, orthopnea. Denies  lower extremity edema, abdominal pain, change in appetite, change in bowel movements. Patient denies rashes, musculoskeletal complaints. No other specific complaints in a complete review of systems.   There were no vitals taken for this visit.  well-developed well-nourished male in no acute distress. HEENT exam atraumatic, normocephalic, neck supple without jugular venous distention. Chest clear to auscultation cardiac exam S1-S2 are regular. Abdominal exam overweight with bowel sounds, soft and nontender. Extremities no edema. Neurologic exam is alert with a normal gait.   A/P- shoulder pain- refer to PT

## 2012-09-30 NOTE — Assessment & Plan Note (Signed)
Lipid Panel     Component Value Date/Time   CHOL 235* 09/13/2011 1037   TRIG 179.0* 09/13/2011 1037   HDL 59.80 09/13/2011 1037   CHOLHDL 4 09/13/2011 1037   VLDL 35.8 09/13/2011 1037   LDLCALC  Value: 135         12/11/2009 0615    Will check labs today-- may need more aggressive treatment

## 2012-10-01 ENCOUNTER — Ambulatory Visit (INDEPENDENT_AMBULATORY_CARE_PROVIDER_SITE_OTHER): Payer: Medicare Other | Admitting: Internal Medicine

## 2012-10-01 ENCOUNTER — Encounter: Payer: Self-pay | Admitting: Internal Medicine

## 2012-10-01 VITALS — BP 149/78 | HR 68 | Temp 98.1°F | Wt 176.0 lb

## 2012-10-01 DIAGNOSIS — S43421A Sprain of right rotator cuff capsule, initial encounter: Secondary | ICD-10-CM

## 2012-10-01 DIAGNOSIS — G40909 Epilepsy, unspecified, not intractable, without status epilepticus: Secondary | ICD-10-CM

## 2012-10-01 DIAGNOSIS — I10 Essential (primary) hypertension: Secondary | ICD-10-CM

## 2012-10-01 DIAGNOSIS — R0600 Dyspnea, unspecified: Secondary | ICD-10-CM

## 2012-10-01 DIAGNOSIS — S43429A Sprain of unspecified rotator cuff capsule, initial encounter: Secondary | ICD-10-CM

## 2012-10-01 DIAGNOSIS — R0609 Other forms of dyspnea: Secondary | ICD-10-CM

## 2012-10-01 DIAGNOSIS — E785 Hyperlipidemia, unspecified: Secondary | ICD-10-CM

## 2012-10-01 LAB — HEPATIC FUNCTION PANEL
ALT: 20 U/L (ref 0–53)
AST: 24 U/L (ref 0–37)
Alkaline Phosphatase: 80 U/L (ref 39–117)
Total Bilirubin: 0.5 mg/dL (ref 0.3–1.2)

## 2012-10-01 LAB — TSH: TSH: 1.3 u[IU]/mL (ref 0.35–5.50)

## 2012-10-01 LAB — BASIC METABOLIC PANEL
BUN: 18 mg/dL (ref 6–23)
Chloride: 95 mEq/L — ABNORMAL LOW (ref 96–112)
Glucose, Bld: 93 mg/dL (ref 70–99)
Potassium: 4.5 mEq/L (ref 3.5–5.1)
Sodium: 134 mEq/L — ABNORMAL LOW (ref 135–145)

## 2012-10-01 LAB — CBC WITH DIFFERENTIAL/PLATELET
Basophils Absolute: 0 10*3/uL (ref 0.0–0.1)
Eosinophils Relative: 1.9 % (ref 0.0–5.0)
HCT: 40.2 % (ref 39.0–52.0)
Hemoglobin: 14.2 g/dL (ref 13.0–17.0)
Lymphs Abs: 1.6 10*3/uL (ref 0.7–4.0)
MCV: 86.2 fl (ref 78.0–100.0)
Monocytes Absolute: 0.5 10*3/uL (ref 0.1–1.0)
Monocytes Relative: 7.7 % (ref 3.0–12.0)
Neutro Abs: 3.9 10*3/uL (ref 1.4–7.7)
Platelets: 446 10*3/uL — ABNORMAL HIGH (ref 150.0–400.0)
RDW: 13 % (ref 11.5–14.6)

## 2012-10-01 LAB — LIPID PANEL
HDL: 48.1 mg/dL (ref >39.00)
Total CHOL/HDL Ratio: 4
VLDL: 36 mg/dL (ref 0.0–40.0)

## 2012-10-08 ENCOUNTER — Ambulatory Visit: Payer: Medicare Other | Attending: Internal Medicine | Admitting: Physical Therapy

## 2012-10-08 DIAGNOSIS — IMO0001 Reserved for inherently not codable concepts without codable children: Secondary | ICD-10-CM | POA: Insufficient documentation

## 2012-10-08 DIAGNOSIS — M25519 Pain in unspecified shoulder: Secondary | ICD-10-CM | POA: Insufficient documentation

## 2012-10-08 DIAGNOSIS — M25619 Stiffness of unspecified shoulder, not elsewhere classified: Secondary | ICD-10-CM | POA: Insufficient documentation

## 2012-10-10 ENCOUNTER — Encounter: Payer: Self-pay | Admitting: *Deleted

## 2012-10-10 ENCOUNTER — Ambulatory Visit (INDEPENDENT_AMBULATORY_CARE_PROVIDER_SITE_OTHER): Payer: Medicare Other | Admitting: Internal Medicine

## 2012-10-10 ENCOUNTER — Encounter: Payer: Self-pay | Admitting: Cardiovascular Disease

## 2012-10-10 DIAGNOSIS — R0609 Other forms of dyspnea: Secondary | ICD-10-CM

## 2012-10-10 DIAGNOSIS — R0989 Other specified symptoms and signs involving the circulatory and respiratory systems: Secondary | ICD-10-CM

## 2012-10-10 DIAGNOSIS — R0600 Dyspnea, unspecified: Secondary | ICD-10-CM

## 2012-10-10 LAB — PULMONARY FUNCTION TEST

## 2012-10-10 NOTE — Progress Notes (Signed)
PFT done today. 

## 2012-10-11 ENCOUNTER — Ambulatory Visit: Payer: Medicare Other | Attending: Internal Medicine | Admitting: Physical Therapy

## 2012-10-11 DIAGNOSIS — M25519 Pain in unspecified shoulder: Secondary | ICD-10-CM | POA: Insufficient documentation

## 2012-10-11 DIAGNOSIS — IMO0001 Reserved for inherently not codable concepts without codable children: Secondary | ICD-10-CM | POA: Insufficient documentation

## 2012-10-11 DIAGNOSIS — M25619 Stiffness of unspecified shoulder, not elsewhere classified: Secondary | ICD-10-CM | POA: Insufficient documentation

## 2012-10-12 ENCOUNTER — Ambulatory Visit (INDEPENDENT_AMBULATORY_CARE_PROVIDER_SITE_OTHER): Payer: Medicare Other | Admitting: Cardiovascular Disease

## 2012-10-12 ENCOUNTER — Encounter: Payer: Self-pay | Admitting: Cardiovascular Disease

## 2012-10-12 VITALS — BP 140/82 | HR 68 | Wt 179.0 lb

## 2012-10-12 DIAGNOSIS — R0609 Other forms of dyspnea: Secondary | ICD-10-CM | POA: Insufficient documentation

## 2012-10-12 DIAGNOSIS — I10 Essential (primary) hypertension: Secondary | ICD-10-CM

## 2012-10-12 DIAGNOSIS — R5381 Other malaise: Secondary | ICD-10-CM

## 2012-10-12 DIAGNOSIS — R0600 Dyspnea, unspecified: Secondary | ICD-10-CM

## 2012-10-12 DIAGNOSIS — R5383 Other fatigue: Secondary | ICD-10-CM

## 2012-10-12 DIAGNOSIS — E785 Hyperlipidemia, unspecified: Secondary | ICD-10-CM

## 2012-10-12 DIAGNOSIS — R0989 Other specified symptoms and signs involving the circulatory and respiratory systems: Secondary | ICD-10-CM

## 2012-10-12 DIAGNOSIS — R0602 Shortness of breath: Secondary | ICD-10-CM | POA: Insufficient documentation

## 2012-10-12 NOTE — Assessment & Plan Note (Signed)
Well controlled.  Continue current medications and low sodium Dash type diet.    

## 2012-10-12 NOTE — Assessment & Plan Note (Signed)
Doubt cardiac limitation with normal ECG/exam  Has had normal echo and myovue as well.  Will order CPX as this will tell us if activity is limited by VO2 max or CO

## 2012-10-12 NOTE — Progress Notes (Signed)
Patient ID: Isaiah Davidson, male   DOB: 17-Feb-1932, 79 y.o.   MRN: [id number] 79 yo referred by Dr Cato Mulligan for dyspnea.  This is a chronic problem over a year. No chest pain. Feels fatigue and dyspnea when active for over 15 minutes. No palpitations syncope edema.  Nonsmoker  PFT;s recently done but results not available. No wheezing suptum or fever.  No PND or orthopnea no edema.  Has had normal stress test a year ago.  Echo 2010 normal. Compliant with BP pills Elevated lipids on statin. Dyspnea has been going on for over a year and has not gotten worse  ROS: Denies fever, malais, weight loss, blurry vision, decreased visual acuity, cough, sputum, SOB, hemoptysis, pleuritic pain, palpitaitons, heartburn, abdominal pain, melena, lower extremity edema, claudication, or rash.  All other systems reviewed and negative   General: Affect appropriate Healthy:  appears stated age HEENT: normal Neck supple with no adenopathy JVP normal no bruits no thyromegaly Lungs clear with no wheezing and good diaphragmatic motion Heart:  S1/S2 no murmur,rub, gallop or click PMI normal Abdomen: benighn, BS positve, no tenderness, no AAA no bruit.  No HSM or HJR Distal pulses intact with no bruits No edema Neuro non-focal Skin warm and dry No muscular weakness  Medications Current Outpatient Prescriptions  Medication Sig Dispense Refill  . amLODipine (NORVASC) 10 MG tablet TAKE ONE TABLET BY MOUTH EVERY DAY  90 tablet  0  . aspirin 81 MG tablet Take 81 mg by mouth daily.        . chlorthalidone (HYGROTON) 25 MG tablet TAKE ONE TABLET BY MOUTH EVERY DAY  90 tablet  0  . Docusate Calcium (STOOL SOFTENER PO) Take by mouth as needed.      . donepezil (ARICEPT) 5 MG tablet Take 5 mg by mouth at bedtime.        . levETIRAcetam (KEPPRA) 500 MG tablet TAKE ONE TABLET BY MOUTH TWICE DAILY  60 tablet  5  . Multiple Vitamin (MULTIVITAMIN) capsule Take 1 capsule by mouth daily.        . simvastatin (ZOCOR) 40 MG tablet  TAKE ONE TABLET BY MOUTH EVERY DAY  30 tablet  0   No current facility-administered medications for this visit.    Allergies Review of patient's allergies indicates no known allergies.  Family History: Family History  Problem Relation Age of Onset  . Diabetes Paternal Grandmother     Social History: History   Social History  . Marital Status: Married    Spouse Name: N/A    Number of Children: N/A  . Years of Education: N/A   Occupational History  . Not on file.   Social History Main Topics  . Smoking status: Former Smoker -- 30 years    Types: Cigars  . Smokeless tobacco: Never Used  . Alcohol Use: No  . Drug Use: No  . Sexually Active: Not on file   Other Topics Concern  . Not on file   Social History Narrative  . No narrative on file    Electrocardiogram:  NSR rate 68 normal ECG   Assessment and Plan

## 2012-10-12 NOTE — Patient Instructions (Addendum)
Your physician recommends that you schedule a follow-up appointment in: AS NEEDED  Your physician recommends that you continue on your current medications as directed. Please refer to the Current Medication list given to you today.  Your physician has recommended that you have a cardiopulmonary stress test (CPX). CPX testing is a non-invasive measurement of heart and lung function. It replaces a traditional treadmill stress test. This type of test provides a tremendous amount of information that relates not only to your present condition but also for future outcomes. This test combines measurements of you ventilation, respiratory gas exchange in the lungs, electrocardiogram (EKG), blood pressure and physical response before, during, and following an exercise protocol.

## 2012-10-12 NOTE — Assessment & Plan Note (Signed)
Cholesterol is at goal.  Continue current dose of statin and diet Rx.  No myalgias or side effects.  F/U  LFT's in 6 months. Lab Results  Component Value Date   LDLCALC 96 10/01/2012

## 2012-10-17 ENCOUNTER — Other Ambulatory Visit: Payer: Self-pay | Admitting: Internal Medicine

## 2012-10-18 ENCOUNTER — Ambulatory Visit (HOSPITAL_COMMUNITY): Payer: Medicare Other | Attending: Cardiovascular Disease

## 2012-10-18 DIAGNOSIS — R5381 Other malaise: Secondary | ICD-10-CM

## 2012-10-18 DIAGNOSIS — R0600 Dyspnea, unspecified: Secondary | ICD-10-CM

## 2012-10-18 DIAGNOSIS — R5383 Other fatigue: Secondary | ICD-10-CM

## 2012-10-18 DIAGNOSIS — R0989 Other specified symptoms and signs involving the circulatory and respiratory systems: Secondary | ICD-10-CM | POA: Insufficient documentation

## 2012-10-18 DIAGNOSIS — R0609 Other forms of dyspnea: Secondary | ICD-10-CM | POA: Insufficient documentation

## 2012-10-19 ENCOUNTER — Other Ambulatory Visit (HOSPITAL_COMMUNITY): Payer: Self-pay | Admitting: Unknown Physician Specialty

## 2012-11-01 ENCOUNTER — Other Ambulatory Visit: Payer: Self-pay | Admitting: Internal Medicine

## 2012-12-06 ENCOUNTER — Ambulatory Visit: Payer: Medicare Other

## 2012-12-06 ENCOUNTER — Ambulatory Visit (INDEPENDENT_AMBULATORY_CARE_PROVIDER_SITE_OTHER): Payer: Medicare Other | Admitting: Family Medicine

## 2012-12-06 VITALS — BP 130/72 | HR 70 | Temp 98.0°F | Resp 16 | Ht 69.0 in | Wt 175.0 lb

## 2012-12-06 DIAGNOSIS — M25519 Pain in unspecified shoulder: Secondary | ICD-10-CM

## 2012-12-06 DIAGNOSIS — M25512 Pain in left shoulder: Secondary | ICD-10-CM

## 2012-12-06 MED ORDER — ACETAMINOPHEN-CODEINE #2 300-15 MG PO TABS: 1.0000 | ORAL_TABLET | Freq: Four times a day (QID) | ORAL | Status: DC | PRN

## 2012-12-06 NOTE — Progress Notes (Signed)
[facility] [address] [place] [address] [phone]  Date:  12/06/2012   Name:  Isaiah Davidson   DOB:  [birth year]   MRN:  [id number]  PCP:  Judie Petit, MD    Chief Complaint: Shoulder Pain   History of Present Illness:  Isaiah Davidson is a 79 y.o. very pleasant male patient who presents with the following:  He is here today with left shoulder pain.  He had a nuclear stress test in 09/2011 which was ok He notes pain in the left shoulder for the last 3 days.  He has tried ice, but last night it hurt even with ice.   He had been lifting some chairs, his shoulder hurt but he did not think it was significant but it got worse as the night went on.    He has pain "all day and all night."  He has not yet tried any medication, but has tried ice.   No previous history of trouble with his shoulder.   No chest pain.   No liver failure on recent labs in March.    Patient Active Problem List   Diagnosis Date Noted  . Dyspnea 10/12/2012  . Seizure disorder 04/19/2011  . Carpal tunnel syndrome 08/18/2010  . CYST AND PSEUDOCYST OF PANCREAS 04/12/2010  . HYPERLIPIDEMIA 02/22/2007  . HYPERTENSION 02/22/2007    Past Medical History  Diagnosis Date  . HYPERLIPIDEMIA 02/22/2007  . HYPERTENSION 02/22/2007  . Seizures     treated for siezures around 2010    Past Surgical History  Procedure Laterality Date  . Knee surgery    . Carpal tunnel release      History  Substance Use Topics  . Smoking status: Former Smoker -- 30 years    Types: Cigars  . Smokeless tobacco: Never Used  . Alcohol Use: No    Family History  Problem Relation Age of Onset  . Diabetes Paternal Grandmother     No Known Allergies  Medication list has been reviewed and updated.  Current Outpatient Prescriptions on File Prior to Visit  Medication Sig Dispense Refill  . amLODipine (NORVASC) 10 MG tablet TAKE ONE TABLET BY MOUTH EVERY DAY  90 tablet  1  . aspirin 81 MG  tablet Take 81 mg by mouth daily.        . chlorthalidone (HYGROTON) 25 MG tablet TAKE ONE TABLET BY MOUTH EVERY DAY  90 tablet  1  . Docusate Calcium (STOOL SOFTENER PO) Take by mouth as needed.      . donepezil (ARICEPT) 5 MG tablet Take 5 mg by mouth at bedtime.        . levETIRAcetam (KEPPRA) 500 MG tablet TAKE ONE TABLET BY MOUTH TWICE DAILY  60 tablet  5  . Multiple Vitamin (MULTIVITAMIN) capsule Take 1 capsule by mouth daily.        . simvastatin (ZOCOR) 40 MG tablet TAKE ONE TABLET BY MOUTH ONCE DAILY  90 tablet  3   No current facility-administered medications on file prior to visit.    Review of Systems:  As per HPI- otherwise negative.   Physical Examination: Filed Vitals:   12/06/12 1117  BP: 130/72  Pulse: 70  Temp: 98 F (36.7 C)  Resp: 16   Filed Vitals:   12/06/12 1117  Height: 5\' 9"  (1.753 m)  Weight: 175 lb (79.379 kg)   Body mass index is 25.83 kg/(m^2). Ideal Body Weight: Weight in (lb) to have BMI =  25: 168.9  GEN: WDWN, NAD, Non-toxic, A & O x 3, looks well, accompanied by wife.   HEENT: Atraumatic, Normocephalic. Neck supple. No masses, No LAD. Ears and Nose: No external deformity. CV: RRR, No M/G/R. No JVD. No thrill. No extra heart sounds. PULM: CTA B, no wheezes, crackles, rhonchi. No retractions. No resp. distress. No accessory muscle use. ABD: S, NT, ND, +BS. No rebound. No HSM. EXTR: No c/c/e NEURO Normal gait.  PSYCH: Normally interactive. Conversant. Not depressed or anxious appearing.  Calm demeanor.  Left shoulder; tender over the anterior biceps insertion/ RC insertion.  He has pain with flexion past 90 degrees, abduction past 45 degrees.  Normal strength biceps and triceps.    [facility] reading (PRIMARY) by  Dr. Patsy Lager. Left humerus: negative Left shoulder: negative  *RADIOLOGY REPORT*  Clinical Data: History of left shoulder pain.  LEFT SHOULDER - 2+ VIEW  Comparison: Left humerus.  Findings: There is narrowing of the left AC  joint with associated marginal osteophyte formation consistent with osteoarthritic changes. There is narrowing of the inferior medial aspect of the glenohumeral joint. There is some benign appearing subcortical cyst formation involving the humeral head. These are small. No fracture or dislocation is evident. No calcific bursitis or calcific tendonitis is evident.  IMPRESSION: AC joint osteoarthritic changes are present. There is also some narrowing of the inferior medial aspect of the glenohumeral joint with minimal spurring. Small benign-appearing subcortical cysts in the humeral head.  Clinically significant discrepancy from primary report, if provided: None  LEFT HUMERUS - 2+ VIEW  Comparison: Left shoulder examination.  Findings: No humeral fracture or dislocation is evident. There is suggestion of slight narrowing of the inferior aspect of the glenohumeral joint with minimal beginning spurring. AC joint osteoarthritic changes are present. Benign appearing subcortical small cysts are present in the humeral head. No abnormalities of the diaphysis or distal portion of the humerus were evident.  IMPRESSION: Slight narrowing of inferior aspect of glenohumeral joint with minimal beginning spurring. AC joint osteoarthritis. Benign- appearing subcortical small cysts are present and humeral head. No abnormality of diaphysis or distal portion of the humerus is evident.  Clinically significant discrepancy from primary report, if provided: None   Assessment and Plan: Pain in joint, shoulder region, left - Plan: DG Humerus Left, DG Shoulder Left, acetaminophen-codeine (TYLENOL #2) 300-15 MG per tablet  Pain in left shoulder- suspect biceps tendon or RCT strain.  Need to reduce pain so he can mobilize shoulder.  Will try tylenol #2 in this older patient- do not want to use stronger narcotics if possible.   Called pharmD- should not be a signficiant risk in using codeine in this pt  with seizure disorder Discussed referral to ortho- he may benefit from an injection.  However, at this time he does not desire a referral.  He will let me know if his pain is not better soon.  Went over ROM exercises and encouraged him to do this several times a day.  Recent LFT wnl, normal renal function.   Signed Abbe Amsterdam, MD

## 2012-12-06 NOTE — Patient Instructions (Addendum)
We are going to treat you for shoulder pain with tylenol and codeine.  Be careful of sedation with this medication!  If your shoulder is not feeling better in the next few days please give me a call- Sooner if worse.   If your shoulder continues to bother you I will refer you to see an orthopedist.    Remember to do shoulder range of motion exercises several times a day

## 2012-12-07 ENCOUNTER — Other Ambulatory Visit: Payer: Self-pay

## 2012-12-07 MED ORDER — LEVETIRACETAM 500 MG PO TABS: 500.0000 mg | ORAL_TABLET | Freq: Two times a day (BID) | ORAL | Status: DC

## 2013-04-01 ENCOUNTER — Encounter: Payer: Self-pay | Admitting: Internal Medicine

## 2013-04-01 ENCOUNTER — Ambulatory Visit (INDEPENDENT_AMBULATORY_CARE_PROVIDER_SITE_OTHER): Payer: Medicare Other | Admitting: Internal Medicine

## 2013-04-01 VITALS — BP 122/70 | HR 64 | Temp 98.3°F | Wt 175.0 lb

## 2013-04-01 DIAGNOSIS — Z23 Encounter for immunization: Secondary | ICD-10-CM

## 2013-04-01 DIAGNOSIS — I10 Essential (primary) hypertension: Secondary | ICD-10-CM

## 2013-04-01 NOTE — Progress Notes (Signed)
htn- tolerating meds- no home bps  Lipids- tolerating meds-  Lipid Panel     Component Value Date/Time   CHOL 180 10/01/2012 0934   TRIG 180.0* 10/01/2012 0934   HDL 48.10 10/01/2012 0934   CHOLHDL 4 10/01/2012 0934   VLDL 36.0 10/01/2012 0934   LDLCALC 96 10/01/2012 0934    Hx of seizure d/o- followed by neurology. Tolerating meds  Reviewed pmh, psh, sochx, meds  Ros- no specific complaints  Reviewed immunizations  Exam- reviewed vitals Past Medical History  Diagnosis Date  . HYPERLIPIDEMIA 02/22/2007  . HYPERTENSION 02/22/2007  . Seizures     treated for siezures around 2010    History   Social History  . Marital Status: Married    Spouse Name: N/A    Number of Children: N/A  . Years of Education: N/A   Occupational History  . Not on file.   Social History Main Topics  . Smoking status: Former Smoker -- 30 years    Types: Cigars  . Smokeless tobacco: Never Used  . Alcohol Use: No  . Drug Use: No  . Sexual Activity: Not on file   Other Topics Concern  . Not on file   Social History Narrative  . No narrative on file    Past Surgical History  Procedure Laterality Date  . Knee surgery    . Carpal tunnel release      Family History  Problem Relation Age of Onset  . Diabetes Paternal Grandmother     No Known Allergies  Current Outpatient Prescriptions on File Prior to Visit  Medication Sig Dispense Refill  . amLODipine (NORVASC) 10 MG tablet TAKE ONE TABLET BY MOUTH EVERY DAY  90 tablet  1  . aspirin 81 MG tablet Take 81 mg by mouth daily.        . chlorthalidone (HYGROTON) 25 MG tablet TAKE ONE TABLET BY MOUTH EVERY DAY  90 tablet  1  . Docusate Calcium (STOOL SOFTENER PO) Take by mouth as needed.      . donepezil (ARICEPT) 5 MG tablet Take 5 mg by mouth at bedtime.        . Multiple Vitamin (MULTIVITAMIN) capsule Take 1 capsule by mouth daily.        . simvastatin (ZOCOR) 40 MG tablet TAKE ONE TABLET BY MOUTH ONCE DAILY  90 tablet  3   No current  facility-administered medications on file prior to visit.     patient denies chest pain, shortness of breath, orthopnea. Denies lower extremity edema, abdominal pain, change in appetite, change in bowel movements. Patient denies rashes, musculoskeletal complaints. No other specific complaints in a complete review of systems.   BP 122/70  Pulse 64  Temp(Src) 98.3 F (36.8 C) (Oral)  Wt 175 lb (79.379 kg)  BMI 25.83 kg/m2   well-developed well-nourished male in no acute distress. HEENT exam atraumatic, normocephalic, neck supple without jugular venous distention. Chest clear to auscultation cardiac exam S1-S2 are regular. Abdominal exam overweight with bowel sounds, soft and nontender. Extremities no edema. Neurologic exam is alert with a normal gait.   A/p- flu immunization today

## 2013-04-01 NOTE — Assessment & Plan Note (Signed)
Well controlled Continue meds 

## 2013-04-03 ENCOUNTER — Ambulatory Visit: Payer: Medicare Other | Admitting: Internal Medicine

## 2013-05-03 ENCOUNTER — Other Ambulatory Visit: Payer: Self-pay

## 2013-05-03 MED ORDER — CHLORTHALIDONE 25 MG PO TABS: ORAL_TABLET | ORAL | Status: DC

## 2013-05-03 NOTE — Telephone Encounter (Signed)
Rx sent to pharmacy for chlorthalid 25mg .

## 2013-05-15 ENCOUNTER — Encounter: Payer: Self-pay | Admitting: Nurse Practitioner

## 2013-05-15 ENCOUNTER — Ambulatory Visit (INDEPENDENT_AMBULATORY_CARE_PROVIDER_SITE_OTHER): Payer: Medicare Other | Admitting: Nurse Practitioner

## 2013-05-15 ENCOUNTER — Encounter (INDEPENDENT_AMBULATORY_CARE_PROVIDER_SITE_OTHER): Payer: Self-pay

## 2013-05-15 VITALS — BP 137/72 | HR 66 | Ht 71.0 in | Wt 175.0 lb

## 2013-05-15 DIAGNOSIS — F0390 Unspecified dementia without behavioral disturbance: Secondary | ICD-10-CM

## 2013-05-15 DIAGNOSIS — G40309 Generalized idiopathic epilepsy and epileptic syndromes, not intractable, without status epilepticus: Secondary | ICD-10-CM

## 2013-05-15 DIAGNOSIS — R413 Other amnesia: Secondary | ICD-10-CM

## 2013-05-15 MED ORDER — DONEPEZIL HCL 5 MG PO TABS: 5.0000 mg | ORAL_TABLET | Freq: Every day | ORAL | Status: DC

## 2013-05-15 NOTE — Progress Notes (Signed)
[facility]  PATIENT: Isaiah Davidson DOB: 07/27/1931   REASON FOR VISIT: For memory loss   HISTORY OF PRESENT ILLNESS: Mr. Shughart, 79 year old black male  presents for followup for memory loss. He was last seen by Dr. Vickey Huger 05/18/2012   CT in past showed established white matter disease.  He has COPD, and high cholersterol.   No seizure activity in over 3 years.  Hx of  CTS surgery. On Aricept 5mg  with  side effects to 10mg  dose.  Memory is stable per MMSE. No increased confusion or behavior issues according to wife.  Remains independent in ADL's. Minimal exercise but does "keep up the yard" appetite is reportedly good and he is sleeping well at night   HISTORY: 05-18-12 Dr. Vickey Huger Rv for this established mild cognitive impairmed patient, who scores today 23-30 on MOCA and did score 2930 on MMSE 6 month ago. he has had a single  prolonged seizure 2 years ago, no recurrence on keppra. EEG reviewed. patient doing well on 5 mg daily aricept, but should try 5 mg bid po.       REVIEW OF SYSTEMS: Full 14 system review of systems performed and notable only for:  Constitutional: N/A  Cardiovascular: N/A  Ear/Nose/Throat: N/A  Skin: N/A  Eyes: N/A  Respiratory: N/A  Gastroitestinal: N/A  Hematology/Lymphatic: N/A  Endocrine: N/A Musculoskeletal:N/A  Allergy/Immunology: N/A  Neurological: memory loss Psychiatric: N/A   ALLERGIES: No Known Allergies  HOME MEDICATIONS: Outpatient Prescriptions Prior to Visit  Medication Sig Dispense Refill  . amLODipine (NORVASC) 10 MG tablet TAKE ONE TABLET BY MOUTH EVERY DAY  90 tablet  1  . aspirin 81 MG tablet Take 81 mg by mouth daily.        . chlorthalidone (HYGROTON) 25 MG tablet TAKE ONE TABLET BY MOUTH EVERY DAY  90 tablet  1  . Docusate Calcium (STOOL SOFTENER PO) Take by mouth as needed.      . donepezil (ARICEPT) 5 MG tablet Take 5 mg by mouth at bedtime.        . Multiple Vitamin (MULTIVITAMIN) capsule Take  1 capsule by mouth daily.        . simvastatin (ZOCOR) 40 MG tablet TAKE ONE TABLET BY MOUTH ONCE DAILY  90 tablet  3   No facility-administered medications prior to visit.    PAST MEDICAL HISTORY: Past Medical History  Diagnosis Date  . HYPERLIPIDEMIA 02/22/2007  . HYPERTENSION 02/22/2007  . Seizures     treated for siezures around 2010    PAST SURGICAL HISTORY: Past Surgical History  Procedure Laterality Date  . Knee surgery    . Carpal tunnel release      FAMILY HISTORY: Family History  Problem Relation Age of Onset  . Diabetes Paternal Grandmother     SOCIAL HISTORY: History   Social History  . Marital Status: Married    Spouse Name: [name]    Number of Children: 2  . Years of Education: 10   Occupational History  .     Social History Main Topics  . Smoking status: Former Smoker -- 30 years    Types: Cigars  . Smokeless tobacco: Never Used  . Alcohol Use: No  . Drug Use: No  . Sexual Activity: Not on file   Other Topics Concern  . Not on file   Social History Narrative   Patient is married ([name]).   Patient has 2 children.   Patient is retired.   Patient is right-handed.  Patient has a 10th grade education.   Patient drinks one cup of coffee daily.           PHYSICAL EXAM  Filed Vitals:   05/15/13 0927  BP: 137/72  Pulse: 66  Height: 5\' 11"  (1.803 m)  Weight: 175 lb (79.379 kg)   Body mass index is 24.42 kg/(m^2).  Generalized: Well developed, in no acute distress  Head: normocephalic and atraumatic,. Oropharynx benign  Neck: Supple, no carotid bruits  Cardiac: Regular rate rhythm, no murmur  Musculoskeletal: No deformity   Neurological examination   Mentation: Alert  MMSE 27/30. Missing one item in orientation, one in recall, and cannot copy a figure . GDS=1.  Follows all commands speech and language fluent  Cranial nerve II-XII: .Pupils were equal round reactive to light extraocular movements were full, visual field were  full on confrontational test. Facial sensation and strength were normal. hearing was intact to finger rubbing bilaterally. Uvula tongue midline. head turning and shoulder shrug and were normal and symmetric.Tongue protrusion into cheek strength was normal. Motor: normal bulk and tone, full strength in the BUE, BLE, fine finger movements normal,  No focal weakness Sensory: normal and symmetric to light touch, pinprick, and  vibration  Coordination: finger-nose-finger, heel-to-shin bilaterally, no dysmetria Reflexes: 1+ upper,  lower and symmetric Gait and Station: Rising up from seated position without assistance, normal stance,  moderate stride, good arm swing, smooth turning, able to perform tiptoe, and heel walking without difficulty.   DIAGNOSTIC DATA (LABS, IMAGING, TESTING) - I reviewed patient records, labs, notes, testing and imaging myself where available.  Lab Results  Component Value Date   WBC 6.2 10/01/2012   HGB 14.2 10/01/2012   HCT 40.2 10/01/2012   MCV 86.2 10/01/2012   PLT 446.0* 10/01/2012      Component Value Date/Time   NA 134* 10/01/2012 0934   K 4.5 10/01/2012 0934   CL 95* 10/01/2012 0934   CO2 29 10/01/2012 0934   GLUCOSE 93 10/01/2012 0934   BUN 18 10/01/2012 0934   CREATININE 1.2 10/01/2012 0934   CALCIUM 9.7 10/01/2012 0934   PROT 8.4* 10/01/2012 0934   ALBUMIN 4.0 10/01/2012 0934   AST 24 10/01/2012 0934   ALT 20 10/01/2012 0934   ALKPHOS 80 10/01/2012 0934   BILITOT 0.5 10/01/2012 0934   GFRNONAA 77.16 01/28/2010 0809   GFRAA  Value: >60        The eGFR has been calculated using the MDRD equation. This calculation has not been validated in all clinical situations. eGFR's persistently <60 mL/min signify possible Chronic Kidney Disease. 12/11/2009 0615   Lab Results  Component Value Date   CHOL 180 10/01/2012   HDL 48.10 10/01/2012   LDLCALC 96 10/01/2012   LDLDIRECT 133.1 09/13/2011   TRIG 180.0* 10/01/2012   CHOLHDL 4 10/01/2012     Lab Results  Component Value Date     TSH 1.30 10/01/2012      ASSESSMENT AND PLAN  79 y.o. year old male  has a past medical history of HYPERLIPIDEMIA (02/22/2007); HYPERTENSION (02/22/2007); and Seizures.and memory loss  here to followup. Currently on Aricept 5mg  daily, unable to tolerate 10mg  dose.   Pt will continue Aricept, will renew Will follow up in 1 year Exercise for over all general health Nilda Riggs, [facility], [facility], APRN  [facility] [address] [place], [place] [address] [phone]

## 2013-05-15 NOTE — Patient Instructions (Addendum)
Pt will continue Aricept, will renew Will follow up in 1 year

## 2013-07-05 ENCOUNTER — Other Ambulatory Visit: Payer: Self-pay | Admitting: Neurology

## 2013-07-31 ENCOUNTER — Telehealth: Payer: Self-pay | Admitting: Internal Medicine

## 2013-07-31 MED ORDER — OSELTAMIVIR PHOSPHATE 75 MG PO CAPS: 75.0000 mg | ORAL_CAPSULE | Freq: Two times a day (BID) | ORAL | Status: DC

## 2013-07-31 NOTE — Telephone Encounter (Signed)
Patient Information:  Caller Name: Isaiah Davidson  Phone: [phone]  Patient: Isaiah, Davidson  Gender: Male  DOB: 1931-11-13  Age: 80 Years  PCP: Phoebe Sharps (Adults only)  Office Follow Up:  Does the office need to follow up with this patient?: Yes  Instructions For The Office: requesting medication for influenza be called in to [facility] on [address] [place]   Symptoms  Reason For Call & Symptoms: Wife/Isaiah Davidson states Isaiah Davidson had onset of generalized aches and fever on 07/27/13. Slight sore throat onset 07/30/13. Intermittent cough on 07/30/13. No cough today 07/31/13. No runny nose, stuffy nose or facial discomfort.  Per influenza protocol has discuss with PCP and callback by nurse within one hour  due to high risk- age > 64 years and has flu symptoms. Requesting a medication be called in to [facility] on [address] History In EMR: Yes  Reviewed Medications In EMR: Yes  Reviewed Allergies In EMR: Yes  Reviewed Surgeries / Procedures: Yes  Date of Onset of Symptoms: 07/27/2013  Treatments Tried: Cold medicine- cold and flu med  Treatments Tried Worked: Yes  Guideline(s) Used:  Influenza - Seasonal  Disposition Per Guideline:   Discuss with PCP and Callback by Nurse within 1 Hour  Reason For Disposition Reached:   HIGH RISK (e.g., age > 59 years, pregnant, HIV+, chronic medical condition) and flu symptoms  Advice Given:  N/A  Patient Will Follow Care Advice:  YES

## 2013-07-31 NOTE — Telephone Encounter (Signed)
Per Dr Leanne Chang, Tamiflu.  Wife is aware.

## 2013-08-19 ENCOUNTER — Other Ambulatory Visit: Payer: Self-pay

## 2013-08-19 MED ORDER — LEVETIRACETAM 500 MG PO TABS: 500.0000 mg | ORAL_TABLET | Freq: Two times a day (BID) | ORAL | Status: DC

## 2013-08-19 MED ORDER — DONEPEZIL HCL 5 MG PO TABS: 5.0000 mg | ORAL_TABLET | Freq: Every day | ORAL | Status: DC

## 2013-08-22 ENCOUNTER — Telehealth: Payer: Self-pay | Admitting: Internal Medicine

## 2013-08-22 MED ORDER — SIMVASTATIN 40 MG PO TABS: ORAL_TABLET | ORAL | Status: DC

## 2013-08-22 MED ORDER — AMLODIPINE BESYLATE 10 MG PO TABS: ORAL_TABLET | ORAL | Status: DC

## 2013-08-22 MED ORDER — CHLORTHALIDONE 25 MG PO TABS: ORAL_TABLET | ORAL | Status: DC

## 2013-08-22 NOTE — Telephone Encounter (Signed)
[facility] requesting new scripts for the following:  amLODipine (NORVASC) 10 MG tablet simvastatin (ZOCOR) 40 MG tablet chlorthalidone (HYGROTON) 25 MG tablet

## 2013-08-22 NOTE — Telephone Encounter (Signed)
rx sent in electronically 

## 2013-09-24 ENCOUNTER — Encounter: Payer: Self-pay | Admitting: Internal Medicine

## 2013-09-24 ENCOUNTER — Ambulatory Visit (INDEPENDENT_AMBULATORY_CARE_PROVIDER_SITE_OTHER): Payer: Commercial Managed Care - HMO | Admitting: Internal Medicine

## 2013-09-24 VITALS — BP 128/72 | HR 72 | Temp 98.3°F | Ht 71.0 in | Wt 178.0 lb

## 2013-09-24 DIAGNOSIS — I10 Essential (primary) hypertension: Secondary | ICD-10-CM

## 2013-09-24 DIAGNOSIS — E785 Hyperlipidemia, unspecified: Secondary | ICD-10-CM

## 2013-09-24 DIAGNOSIS — R413 Other amnesia: Secondary | ICD-10-CM

## 2013-09-24 LAB — BASIC METABOLIC PANEL
BUN: 21 mg/dL (ref 6–23)
CALCIUM: 9.9 mg/dL (ref 8.4–10.5)
CHLORIDE: 96 meq/L (ref 96–112)
CO2: 29 mEq/L (ref 19–32)
Creatinine, Ser: 1.5 mg/dL (ref 0.4–1.5)
GFR: 58.85 mL/min — ABNORMAL LOW (ref >60.00)
Glucose, Bld: 101 mg/dL — ABNORMAL HIGH (ref 70–99)
Potassium: 4.1 mEq/L (ref 3.5–5.1)
SODIUM: 135 meq/L (ref 135–145)

## 2013-09-24 LAB — HEPATIC FUNCTION PANEL
ALBUMIN: 4.3 g/dL (ref 3.5–5.2)
ALT: 18 U/L (ref 0–53)
AST: 24 U/L (ref 0–37)
Alkaline Phosphatase: 80 U/L (ref 39–117)
Bilirubin, Direct: 0 mg/dL (ref 0.0–0.3)
Total Bilirubin: 0.4 mg/dL (ref 0.3–1.2)
Total Protein: 7.8 g/dL (ref 6.0–8.3)

## 2013-09-24 LAB — LIPID PANEL
Cholesterol: 201 mg/dL — ABNORMAL HIGH (ref 0–200)
HDL: 51.9 mg/dL (ref >39.00)
LDL Cholesterol: 93 mg/dL (ref 0–99)
TRIGLYCERIDES: 281 mg/dL — AB (ref 0.0–149.0)
Total CHOL/HDL Ratio: 4
VLDL: 56.2 mg/dL — ABNORMAL HIGH (ref 0.0–40.0)

## 2013-09-24 NOTE — Progress Notes (Signed)
Pre visit review using our clinic review tool, if applicable. No additional management support is needed unless otherwise documented below in the visit note. 

## 2013-09-24 NOTE — Assessment & Plan Note (Signed)
Well controlled conitnue current meds

## 2013-09-24 NOTE — Addendum Note (Signed)
Addended by: Joyce Gross R on: 09/24/2013 01:43 PM   Modules accepted: Orders

## 2013-09-24 NOTE — Assessment & Plan Note (Signed)
Stable  Followed by neurology

## 2013-09-24 NOTE — Progress Notes (Signed)
htn- tolerating meds, no home bps  Lipids- no tolerating meds  Hx of seizures- no recurrence  Past Medical History  Diagnosis Date  . HYPERLIPIDEMIA 02/22/2007  . HYPERTENSION 02/22/2007  . Seizures     treated for siezures around 2010    History   Social History  . Marital Status: Married    Spouse Name: [name]    Number of Children: 2  . Years of Education: 10   Occupational History  .     Social History Main Topics  . Smoking status: Former Smoker -- 30 years    Types: Cigars  . Smokeless tobacco: Never Used  . Alcohol Use: No  . Drug Use: No  . Sexual Activity: Not on file   Other Topics Concern  . Not on file   Social History Narrative   Patient is married ([name]).   Patient has 2 children.   Patient is retired.   Patient is right-handed.   Patient has a 10th grade education.   Patient drinks one cup of coffee daily.          Past Surgical History  Procedure Laterality Date  . Knee surgery    . Carpal tunnel release      Family History  Problem Relation Age of Onset  . Diabetes Paternal Grandmother     No Known Allergies  Current Outpatient Prescriptions on File Prior to Visit  Medication Sig Dispense Refill  . amLODipine (NORVASC) 10 MG tablet TAKE ONE TABLET BY MOUTH EVERY DAY  90 tablet  1  . aspirin 81 MG tablet Take 81 mg by mouth daily.        . chlorthalidone (HYGROTON) 25 MG tablet TAKE ONE TABLET BY MOUTH EVERY DAY  90 tablet  1  . Docusate Calcium (STOOL SOFTENER PO) Take by mouth as needed.      . donepezil (ARICEPT) 5 MG tablet Take 1 tablet (5 mg total) by mouth at bedtime.  90 tablet  2  . levETIRAcetam (KEPPRA) 500 MG tablet Take 1 tablet (500 mg total) by mouth 2 (two) times daily.  180 tablet  2  . Multiple Vitamin (MULTIVITAMIN) capsule Take 1 capsule by mouth daily.        [name] Kitchen oseltamivir (TAMIFLU) 75 MG capsule Take 1 capsule (75 mg total) by mouth 2 (two) times daily.  14 capsule  0  . simvastatin (ZOCOR) 40 MG tablet  TAKE ONE TABLET BY MOUTH ONCE DAILY  90 tablet  3   No current facility-administered medications on file prior to visit.     patient denies chest pain, shortness of breath, orthopnea. Denies lower extremity edema, abdominal pain, change in appetite, change in bowel movements. Patient denies rashes, musculoskeletal complaints. No other specific complaints in a complete review of systems.   Reviewed vitals:  well-developed well-nourished male in no acute distress. HEENT exam atraumatic, normocephalic, neck supple without jugular venous distention. Chest clear to auscultation cardiac exam S1-S2 are regular. Abdominal exam overweight with bowel sounds, soft and nontender. Extremities no edema. Neurologic exam is alert with a normal gait.  HYPERTENSION Well controlled conitnue current meds  HYPERLIPIDEMIA Tolerating meds Will check labs today  Memory loss Stable Followed by neurology

## 2013-09-24 NOTE — Assessment & Plan Note (Signed)
Tolerating meds Will check labs today

## 2013-09-25 ENCOUNTER — Telehealth: Payer: Self-pay | Admitting: Internal Medicine

## 2013-09-25 NOTE — Telephone Encounter (Signed)
Relevant patient education mailed to patient.  

## 2013-09-30 ENCOUNTER — Ambulatory Visit: Payer: Medicare Other | Admitting: Internal Medicine

## 2013-11-08 DEATH — deceased

## 2014-02-12 ENCOUNTER — Other Ambulatory Visit: Payer: Self-pay | Admitting: Internal Medicine

## 2014-03-28 ENCOUNTER — Ambulatory Visit: Payer: Commercial Managed Care - HMO | Admitting: Internal Medicine

## 2014-04-14 ENCOUNTER — Ambulatory Visit (HOSPITAL_COMMUNITY)
Admit: 2014-04-14 | Discharge: 2014-04-14 | Disposition: A | Discharge status: Home / Self Care | Payer: Medicare PPO | Source: Ambulatory Visit | Attending: Family Medicine | Admitting: Family Medicine

## 2014-04-14 ENCOUNTER — Emergency Department (INDEPENDENT_AMBULATORY_CARE_PROVIDER_SITE_OTHER)
Admission: EM | Admit: 2014-04-14 | Discharge: 2014-04-14 | Disposition: A | Discharge status: Home / Self Care | Payer: Medicare PPO | Source: Home / Self Care | Attending: Family Medicine | Admitting: Family Medicine

## 2014-04-14 ENCOUNTER — Encounter (HOSPITAL_COMMUNITY): Payer: Self-pay | Admitting: Emergency Medicine

## 2014-04-14 DIAGNOSIS — R0989 Other specified symptoms and signs involving the circulatory and respiratory systems: Secondary | ICD-10-CM | POA: Insufficient documentation

## 2014-04-14 DIAGNOSIS — R05 Cough: Secondary | ICD-10-CM | POA: Insufficient documentation

## 2014-04-14 DIAGNOSIS — J40 Bronchitis, not specified as acute or chronic: Secondary | ICD-10-CM

## 2014-04-14 MED ORDER — IPRATROPIUM-ALBUTEROL 0.5-2.5 (3) MG/3ML IN SOLN
RESPIRATORY_TRACT | Status: AC
  Filled 2014-04-14: qty 3

## 2014-04-14 MED ORDER — AZITHROMYCIN 250 MG PO TABS: 250.0000 mg | ORAL_TABLET | Freq: Every day | ORAL | Status: DC

## 2014-04-14 MED ORDER — IPRATROPIUM-ALBUTEROL 0.5-2.5 (3) MG/3ML IN SOLN
3.0000 mL | Freq: Once | RESPIRATORY_TRACT | Status: AC
  Administered 2014-04-14: 3 mL via RESPIRATORY_TRACT

## 2014-04-14 MED ORDER — GUAIFENESIN-CODEINE 100-10 MG/5ML PO SOLN: 5.0000 mL | Freq: Every evening | ORAL | Status: DC | PRN

## 2014-04-14 NOTE — ED Provider Notes (Signed)
Isaiah Davidson is a 80 y.o. male who presents to Urgent Care today for cough. Patient has a 4 day history of cough body aches and subjective fever. No wheezing or shortness of breath. The cough is productive of yellow sputum. No nausea vomiting or diarrhea. No chest pain. No medications used yet.   Past Medical History  Diagnosis Date  . HYPERLIPIDEMIA 02/22/2007  . HYPERTENSION 02/22/2007  . Seizures     treated for siezures around 2010   History  Substance Use Topics  . Smoking status: Former Smoker -- 30 years    Types: Cigars  . Smokeless tobacco: Never Used  . Alcohol Use: No   ROS as above Medications: No current facility-administered medications for this encounter.   Current Outpatient Prescriptions  Medication Sig Dispense Refill  . amLODipine (NORVASC) 10 MG tablet TAKE 1 TABLET EVERY DAY  90 tablet  1  . aspirin 81 MG tablet Take 81 mg by mouth daily.        [name] Kitchen azithromycin (ZITHROMAX) 250 MG tablet Take 1 tablet (250 mg total) by mouth daily. Take first 2 tablets together, then 1 every day until finished.  6 tablet  0  . chlorthalidone (HYGROTON) 25 MG tablet TAKE ONE TABLET BY MOUTH EVERY DAY  90 tablet  1  . Docusate Calcium (STOOL SOFTENER PO) Take by mouth as needed.      . donepezil (ARICEPT) 5 MG tablet Take 1 tablet (5 mg total) by mouth at bedtime.  90 tablet  2  . guaiFENesin-codeine 100-10 MG/5ML syrup Take 5 mLs by mouth at bedtime as needed for cough.  120 mL  0  . levETIRAcetam (KEPPRA) 500 MG tablet Take 1 tablet (500 mg total) by mouth 2 (two) times daily.  180 tablet  2  . Multiple Vitamin (MULTIVITAMIN) capsule Take 1 capsule by mouth daily.        [name] Kitchen oseltamivir (TAMIFLU) 75 MG capsule Take 1 capsule (75 mg total) by mouth 2 (two) times daily.  14 capsule  0  . simvastatin (ZOCOR) 40 MG tablet TAKE ONE TABLET BY MOUTH ONCE DAILY  90 tablet  3    Exam:  BP 122/80  Pulse 77  Temp(Src) 98.1 F (36.7 C) (Oral)  Resp 12  SpO2 95% Gen: Well NAD HEENT:  EOMI,  MMM posterior pharyngeal cobblestoning. Normal tympanic membranes bilaterally. Lungs: Normal work of breathing. CTABL Heart: RRR no MRG Abd: NABS, Soft. Nondistended, Nontender Exts: Brisk capillary refill, warm and well perfused.   Patient was given a 2.5 albuterol and 0.5 Atrovent DuoNeb nebulizer treatment, and did not feel better  No results found for this or any previous visit (from the past 24 hour(s)). Dg Chest 2 View  04/14/2014   CLINICAL DATA:  Cough and congestion.  Fever for 6 days.  EXAM: CHEST  2 VIEW  COMPARISON:  12/10/2009  FINDINGS: The cardiac silhouette, mediastinal and hilar contours are within normal limits and stable. There is mild tortuosity and calcification of the thoracic aorta. The lungs are clear of acute process. Mild chronic bronchitic changes. No infiltrates or effusions. The bony thorax is intact.  IMPRESSION: No acute cardiopulmonary findings.   Electronically Signed   By: Kalman Jewels M.D.   On: 04/14/2014 12:39    Assessment and Plan: 80 y.o. male with bronchitis. Likely viral. Plan for watchful waiting Tylenol codeine containing cough medication. Azithromycin antibiotics for use if not improving. Followup with PCP  Discussed warning signs or symptoms. Please see discharge  instructions. Patient expresses understanding.     Gregor Hams, MD 04/14/14 [id number]

## 2014-04-14 NOTE — Discharge Instructions (Signed)
Thank you for coming in today. Use codeine containing cough medication at bedtime as needed.  Take azithromycin antibiotics if not getting better.  Followup with your primary care provider. Call or go to the emergency room if you get worse, have trouble breathing, have chest pains, or palpitations.   Acute Bronchitis Bronchitis is inflammation of the airways that extend from the windpipe into the lungs (bronchi). The inflammation often causes mucus to develop. This leads to a cough, which is the most common symptom of bronchitis.  In acute bronchitis, the condition usually develops suddenly and goes away over time, usually in a couple weeks. Smoking, allergies, and asthma can make bronchitis worse. Repeated episodes of bronchitis may cause further lung problems.  CAUSES Acute bronchitis is most often caused by the same virus that causes a cold. The virus can spread from person to person (contagious) through coughing, sneezing, and touching contaminated objects. SIGNS AND SYMPTOMS   Cough.   Fever.   Coughing up mucus.   Body aches.   Chest congestion.   Chills.   Shortness of breath.   Sore throat.  DIAGNOSIS  Acute bronchitis is usually diagnosed through a physical exam. Your health care provider will also ask you questions about your medical history. Tests, such as chest X-rays, are sometimes done to rule out other conditions.  TREATMENT  Acute bronchitis usually goes away in a couple weeks. Oftentimes, no medical treatment is necessary. Medicines are sometimes given for relief of fever or cough. Antibiotic medicines are usually not needed but may be prescribed in certain situations. In some cases, an inhaler may be recommended to help reduce shortness of breath and control the cough. A cool mist vaporizer may also be used to help thin bronchial secretions and make it easier to clear the chest.  HOME CARE INSTRUCTIONS  Get plenty of rest.   Drink enough fluids to keep  your urine clear or pale yellow (unless you have a medical condition that requires fluid restriction). Increasing fluids may help thin your respiratory secretions (sputum) and reduce chest congestion, and it will prevent dehydration.   Take medicines only as directed by your health care provider.  If you were prescribed an antibiotic medicine, finish it all even if you start to feel better.  Avoid smoking and secondhand smoke. Exposure to cigarette smoke or irritating chemicals will make bronchitis worse. If you are a smoker, consider using nicotine gum or skin patches to help control withdrawal symptoms. Quitting smoking will help your lungs heal faster.   Reduce the chances of another bout of acute bronchitis by washing your hands frequently, avoiding people with cold symptoms, and trying not to touch your hands to your mouth, nose, or eyes.   Keep all follow-up visits as directed by your health care provider.  SEEK MEDICAL CARE IF: Your symptoms do not improve after 1 week of treatment.  SEEK IMMEDIATE MEDICAL CARE IF:  You develop an increased fever or chills.   You have chest pain.   You have severe shortness of breath.  You have bloody sputum.   You develop dehydration.  You faint or repeatedly feel like you are going to pass out.  You develop repeated vomiting.  You develop a severe headache. MAKE SURE YOU:   Understand these instructions.  Will watch your condition.  Will get help right away if you are not doing well or get worse. Document Released: 08/04/2004 Document Revised: 11/11/2013 Document Reviewed: 12/18/2012 [facility] Patient Information 2015 [place], [place]. This information  is not intended to replace advice given to you by your health care provider. Make sure you discuss any questions you have with your health care provider.

## 2014-04-14 NOTE — ED Notes (Signed)
Pt here today for a cough and body aches, pt said that he started coughing last Wednesday and it started to get worse on Friday, pt said that he has been coughing up yellow sputum

## 2014-05-14 ENCOUNTER — Ambulatory Visit (INDEPENDENT_AMBULATORY_CARE_PROVIDER_SITE_OTHER): Payer: Medicare PPO | Admitting: Neurology

## 2014-05-14 ENCOUNTER — Encounter: Payer: Self-pay | Admitting: Neurology

## 2014-05-14 VITALS — BP 137/83 | HR 80 | Temp 97.5°F | Resp 14 | Ht 70.25 in | Wt 181.0 lb

## 2014-05-14 DIAGNOSIS — F0390 Unspecified dementia without behavioral disturbance: Secondary | ICD-10-CM

## 2014-05-14 DIAGNOSIS — G3184 Mild cognitive impairment, so stated: Secondary | ICD-10-CM | POA: Insufficient documentation

## 2014-05-14 DIAGNOSIS — G40209 Localization-related (focal) (partial) symptomatic epilepsy and epileptic syndromes with complex partial seizures, not intractable, without status epilepticus: Secondary | ICD-10-CM

## 2014-05-14 HISTORY — DX: Mild cognitive impairment of uncertain or unknown etiology: G31.84

## 2014-05-14 MED ORDER — DONEPEZIL HCL 5 MG PO TABS: ORAL_TABLET | ORAL | Status: DC

## 2014-05-14 MED ORDER — LEVETIRACETAM 500 MG PO TABS: 500.0000 mg | ORAL_TABLET | Freq: Two times a day (BID) | ORAL | Status: DC

## 2014-05-14 NOTE — Patient Instructions (Signed)
Mild Neurocognitive Disorder Mild neurocognitive disorder (formerly known as mild cognitive impairment) is a mental disorder. It is a slight abnormal decrease in mental function. The areas of mental function affected may include memory, thought, communication, behavior, and completion of tasks. The decrease is noticeable and measurable but for the most part does not interfere with your daily activities. Mild neurocognitive disorder typically occurs in people older than 60 years but can occur earlier. It is not as serious as major neurocognitive disorder (formerly known as dementia) but may lead to a more serious neurocognitive disorder. However, in some cases the condition does not get worse. A few people with this disorder even improve. CAUSES  There are a number of different causes of mild neurocognitive disorder:   Brain disorders associated with abnormal protein deposits, such as Alzheimer's disease, Pick's disease, and Lewy body disease.  Brain disorders associated with abnormal movement, such as Parkinson's disease and Huntington's disease.  Diseases affecting blood vessels in the brain and resulting in mini-strokes.  Certain infections, such as human immunodeficiency virus (HIV) infection.  Traumatic brain injury.  Other medical conditions such as brain tumors, underactive thyroid (hypothyroidism), and vitamin B12 deficiency.  Use of certain prescription medicine and "recreational" drugs. SYMPTOMS  Symptoms of mild neurocognitive disorder include:  Difficulty remembering. You may forget details of recent events, names, or phone numbers. You may forget important social events and appointments or repeatedly forget where you put your car keys.  Difficulty thinking and solving problems. You may have trouble with complex tasks such as paying bills or driving in unfamiliar locations.  Difficulty communicating. You may have trouble finding the right word, naming an object, forming a  sentence that makes sense, or understanding what you read or hear.  Changes in your behavior or personality. You may lose interest in the things that you used to enjoy or withdraw from social situations. You may get angry more easily than usual. You may act before thinking. You may do things in public that you would not usually do. You may hear or see things that are not real (hallucinations). You may believe falsely that others are trying to hurt you (paranoia). DIAGNOSIS Mild neurocognitive disorder is diagnosed through an assessment by your health care provider. Your health care provider will ask you and your family, friends, or coworkers questions about your symptoms. He or she will ask how often the symptoms occur, how long they have been occurring, whether they are getting worse, and the effect they are having on your life. Your health care provider may refer you to a neurologist or mental health specialist for a detailed evaluation of your mental functions (neuropsychological testing).  To identify the cause of your mild neurocognitive disorder, your health care provider may:  Obtain a detailed medical history.  Ask about alcohol and drug use, including prescription medicine.  Perform a physical exam.  Order blood tests and brain imaging exams. TREATMENT  Mild neurocognitive disorder caused by infections, use of certain medicines or "recreational" drugs, and certain medical conditions may improve with treatment of the condition that is causing the disorder. Mild neurocognitive disorder resulting from other causes generally does not improve and may worsen. In these cases, the goal of treatment is to slow progression of the disorder and help you cope with the loss of mental function. Treatments in these cases include:   Medicine. Medicine helps mainly with memory loss and behavioral symptoms.   Talk therapy. Talk therapy provides education, emotional support, memory aids, and other   include:   · Medicine. Medicine helps mainly with memory loss and behavioral symptoms.    · Talk therapy. Talk therapy provides education, emotional support, memory aids, and other ways of  making up for decreases in mental function.       · Lifestyle changes. These include regular exercise, a healthy diet (including essential omega-3 fatty acids), intellectual stimulation, and increased social interaction.  Document Released: 02/27/2013 Document Revised: 11/11/2013 Document Reviewed: 02/27/2013  Exit[employer]® Patient Information ©2015 [employer]. This information is not intended to replace advice given to you by your health care provider. Make sure you discuss any questions you have with your health care provider.

## 2014-05-14 NOTE — Progress Notes (Signed)
[facility]  PATIENT: Isaiah Davidson DOB: 1932/06/06   REASON FOR VISIT: For memory loss   HISTORY OF PRESENT ILLNESS: Isaiah Davidson,  A married, right handed  80 -year-old  african [place] male,  presents for followup for memory loss and seizure control. [name] Kitchen He was last seen by  Cecille Rubin 05-13-13.  A head  CT in past showed established white matter disease.  He has COPD, and high cholersterol.   No seizure activity in over 4 years.  Hx of  Carpal Tunnel  Surgery. He has tolerated Aricept 13m bid , after presenting with GI  side effects to 184mdose.   [facility]30 points  .  No increased confusion or behavior issues according to wife.  Remains independent in ADL's. Minimal exercise but does "keep up the yard", his appetite is reportedly good and he is sleeping well at night.  His wife has always balanced the cheques and he has always done the maintenance on cars, garden and Appliances.  He still does a "little plumming".    HISTORY: 05-18-12 Dr. DoBrett Fairyv for this established mild cognitive impairmed patient, who scores today 23-30 on MOCA and did score 29/30 on MMSE 6 month ago. he has had a single  prolonged seizure 2011, no recurrence on Keppra. EEG reviewed.  patient doing well on 5 mg daily aricept, but should try 5 mg bid po.       REVIEW OF SYSTEMS: Full 14 system review of systems performed and notable only for:  Constitutional: N/A  Cardiovascular: N/A  Ear/Nose/Throat: N/A  Skin: N/A  Eyes: N/A  Respiratory: N/A  Gastroitestinal: N/A  Hematology/Lymphatic: N/A  Endocrine: N/A Musculoskeletal:N/A  Allergy/Immunology: N/A  Neurological: memory loss Psychiatric: N/A   ALLERGIES: No Known Allergies  HOME MEDICATIONS: Outpatient Prescriptions Prior to Visit  Medication Sig Dispense Refill  . amLODipine (NORVASC) 10 MG tablet TAKE 1 TABLET EVERY DAY 90 tablet 1  . aspirin 81 MG tablet Take 81 mg by mouth daily.       . chlorthalidone (HYGROTON) 25 MG tablet TAKE ONE TABLET BY MOUTH EVERY DAY 90 tablet 1  . Docusate Calcium (STOOL SOFTENER PO) Take by mouth as needed.    . donepezil (ARICEPT) 5 MG tablet Take 1 tablet (5 mg total) by mouth at bedtime. 90 tablet 2  . levETIRAcetam (KEPPRA) 500 MG tablet Take 1 tablet (500 mg total) by mouth 2 (two) times daily. 180 tablet 2  . Multiple Vitamin (MULTIVITAMIN) capsule Take 1 capsule by mouth daily.      . simvastatin (ZOCOR) 40 MG tablet TAKE ONE TABLET BY MOUTH ONCE DAILY 90 tablet 3  . guaiFENesin-codeine 100-10 MG/5ML syrup Take 5 mLs by mouth at bedtime as needed for cough. 120 mL 0  . azithromycin (ZITHROMAX) 250 MG tablet Take 1 tablet (250 mg total) by mouth daily. Take first 2 tablets together, then 1 every day until finished. 6 tablet 0  . oseltamivir (TAMIFLU) 75 MG capsule Take 1 capsule (75 mg total) by mouth 2 (two) times daily. 14 capsule 0   No facility-administered medications prior to visit.    PAST MEDICAL HISTORY: Past Medical History  Diagnosis Date  . HYPERLIPIDEMIA 02/22/2007  . HYPERTENSION 02/22/2007  . Seizures     treated for siezures around 2010    PAST SURGICAL HISTORY: Past Surgical History  Procedure Laterality Date  . Knee surgery    . Carpal tunnel release  FAMILY HISTORY: Family History  Problem Relation Age of Onset  . Diabetes Paternal Grandmother     SOCIAL HISTORY: History   Social History  . Marital Status: Married    Spouse Name: [name]    Number of Children: 2  . Years of Education: 10   Occupational History  .     Social History Main Topics  . Smoking status: Former Smoker -- 30 years    Types: Cigars  . Smokeless tobacco: Never Used  . Alcohol Use: No  . Drug Use: No  . Sexual Activity: Not on file   Other Topics Concern  . Not on file   Social History Narrative   Patient is married ([name]).   Patient has 2 children.   Patient is retired.   Patient is right-handed.    Patient has a 10th grade education.   Patient drinks one cup of coffee daily.           PHYSICAL EXAM  Filed Vitals:   05/14/14 1038  BP: 137/83  Pulse: 80  Temp: 97.5 F (36.4 C)  TempSrc: Oral  Resp: 14  Height: 5' 10.25" (1.784 m)  Weight: 181 lb (82.101 kg)   Body mass index is 25.8 kg/(m^2).  Generalized: Well developed, in no acute distress  Head: normocephalic and atraumatic,. Oropharynx benign  Neck: Supple, no carotid bruits  Cardiac: Regular rate rhythm, no murmur  Musculoskeletal: No deformity   Neurological examination   Mentation: Alert  MMSE 27/30. Missing one item in orientation, one in recall, and cannot copy a figure . GDS=1.  Follows all commands speech and language fluent  Cranial nerve II-XII: .Pupils were equal round reactive to light extraocular movements were full, visual field were full on confrontational test. Facial sensation and strength were normal. hearing was intact to finger rubbing bilaterally. Uvula tongue midline. head turning and shoulder shrug and were normal and symmetric.Tongue protrusion into cheek strength was normal. Motor: normal bulk and tone, full strength in the BUE, BLE, fine finger movements normal,  No focal weakness Sensory: normal and symmetric to light touch, pinprick, and  vibration  Coordination: finger-nose-finger, heel-to-shin bilaterally, no dysmetria Reflexes: 1+ upper,  lower and symmetric Gait and Station: Rising up from seated position without assistance, normal stance,  moderate stride, good arm swing, smooth turning, able to perform tiptoe, and heel walking without difficulty.   DIAGNOSTIC DATA (LABS, IMAGING, TESTING) - I reviewed patient records, labs, notes, testing and imaging myself where available.  Lab Results  Component Value Date   WBC 6.2 10/01/2012   HGB 14.2 10/01/2012   HCT 40.2 10/01/2012   MCV 86.2 10/01/2012   PLT 446.0* 10/01/2012      Component Value Date/Time   NA 135 09/24/2013 1343     K 4.1 09/24/2013 1343   CL 96 09/24/2013 1343   CO2 29 09/24/2013 1343   GLUCOSE 101* 09/24/2013 1343   BUN 21 09/24/2013 1343   CREATININE 1.5 09/24/2013 1343   CALCIUM 9.9 09/24/2013 1343   PROT 7.8 09/24/2013 1343   ALBUMIN 4.3 09/24/2013 1343   AST 24 09/24/2013 1343   ALT 18 09/24/2013 1343   ALKPHOS 80 09/24/2013 1343   BILITOT 0.4 09/24/2013 1343   GFRNONAA 77.16 01/28/2010 0809   GFRAA  12/11/2009 0615    >60        The eGFR has been calculated using the MDRD equation. This calculation has not been validated in all clinical situations. eGFR's persistently <60 mL/min signify possible  Chronic Kidney Disease.   Lab Results  Component Value Date   CHOL 201* 09/24/2013   HDL 51.90 09/24/2013   LDLCALC 93 09/24/2013   LDLDIRECT 133.1 09/13/2011   TRIG 281.0* 09/24/2013   CHOLHDL 4 09/24/2013     Lab Results  Component Value Date   TSH 1.30 10/01/2012      ASSESSMENT AND PLAN  80 y.o. year old male  has a past medical history of HYPERLIPIDEMIA (02/22/2007); HYPERTENSION (02/22/2007); and Seizures.and memory loss  here to followup.  Currently on Aricept 53m daily, unable to tolerate 145mdose.    [facility] [id number] [id number]

## 2014-05-19 ENCOUNTER — Emergency Department (INDEPENDENT_AMBULATORY_CARE_PROVIDER_SITE_OTHER)
Admission: EM | Admit: 2014-05-19 | Discharge: 2014-05-19 | Disposition: A | Discharge status: Home / Self Care | Payer: Medicare PPO | Source: Home / Self Care | Attending: Family Medicine | Admitting: Family Medicine

## 2014-05-19 ENCOUNTER — Ambulatory Visit (HOSPITAL_COMMUNITY): Payer: Medicare PPO | Attending: Emergency Medicine

## 2014-05-19 ENCOUNTER — Encounter (HOSPITAL_COMMUNITY): Payer: Self-pay | Admitting: Emergency Medicine

## 2014-05-19 DIAGNOSIS — M79645 Pain in left finger(s): Secondary | ICD-10-CM | POA: Insufficient documentation

## 2014-05-19 DIAGNOSIS — M7989 Other specified soft tissue disorders: Secondary | ICD-10-CM | POA: Insufficient documentation

## 2014-05-19 DIAGNOSIS — M19042 Primary osteoarthritis, left hand: Secondary | ICD-10-CM

## 2014-05-19 DIAGNOSIS — R609 Edema, unspecified: Secondary | ICD-10-CM

## 2014-05-19 DIAGNOSIS — R52 Pain, unspecified: Secondary | ICD-10-CM

## 2014-05-19 MED ORDER — PREDNISONE 20 MG PO TABS: ORAL_TABLET | ORAL | Status: DC

## 2014-05-19 NOTE — ED Notes (Signed)
Pt states he woke up about a week ago with pain and swelling in his finger.  He does not recall sustaining any injury to the finger.  The finger is swollen, red and warm to the touch.  Pt has lost ROM in the finger, up to the knuckle. Pt states the swelling is the same, but the pain has reduced a little.

## 2014-05-19 NOTE — Discharge Instructions (Signed)
Arthritis, Nonspecific °Arthritis is pain, redness, warmth, or puffiness (inflammation) of a joint. The joint may be stiff or hurt when you move it. One or more joints may be affected. There are many types of arthritis. Your doctor may not know what type you have right away. The most common cause of arthritis is wear and tear on the joint (osteoarthritis). °HOME CARE  °· Only take medicine as told by your doctor. °· Rest the joint as much as possible. °· Raise (elevate) your joint if it is puffy. °· Use crutches if the painful joint is in your leg. °· Drink enough fluids to keep your pee (urine) clear or pale yellow. °· Follow your doctor's diet instructions. °· Use cold packs for very bad joint pain for 10 to 15 minutes every hour. Ask your doctor if it is okay for you to use hot packs. °· Exercise as told by your doctor. °· Take a warm shower if you have stiffness in the morning. °· Move your sore joints throughout the day. °GET HELP RIGHT AWAY IF:  °· You have a fever. °· You have very bad joint pain, puffiness, or redness. °· You have many joints that are painful and puffy. °· You are not getting better with treatment. °· You have very bad back pain or leg weakness. °· You cannot control when you poop (bowel movement) or pee (urinate). °· You do not feel better in 24 hours or are getting worse. °· You are having side effects from your medicine. °MAKE SURE YOU:  °· Understand these instructions. °· Will watch your condition. °· Will get help right away if you are not doing well or get worse. °Document Released: 09/21/2009 Document Revised: 12/27/2011 Document Reviewed: 09/21/2009 °[employer]® Patient Information ©2015 [employer]. This information is not intended to replace advice given to you by your health care provider. Make sure you discuss any questions you have with your health care provider. ° °

## 2014-05-19 NOTE — ED Provider Notes (Signed)
CSN: [id number]     Arrival date & time 05/19/14  5993 History   First MD Initiated Contact with Patient 05/19/14 1116     Chief Complaint  Patient presents with  . Hand Pain    left pointer finger   (Consider location/radiation/quality/duration/timing/severity/associated sxs/prior Treatment) HPI Comments: As per HPI. Awoke with swelling to the left index finger, primarily to the DIP. Now with limited ROM, persistent tenderness and swelling. No known trauma. St it is a little better than at the outset.   Past Medical History  Diagnosis Date  . HYPERLIPIDEMIA 02/22/2007  . HYPERTENSION 02/22/2007  . Seizures     treated for siezures around 2010  . MCI (mild cognitive impairment) with memory loss 05/14/2014   Past Surgical History  Procedure Laterality Date  . Knee surgery    . Carpal tunnel release     Family History  Problem Relation Age of Onset  . Diabetes Paternal Grandmother    History  Substance Use Topics  . Smoking status: Former Smoker -- 30 years    Types: Cigars  . Smokeless tobacco: Never Used  . Alcohol Use: No    Review of Systems  Constitutional: Negative.   Respiratory: Negative.   Cardiovascular: Negative.   Musculoskeletal: Positive for joint swelling. Negative for back pain and neck pain.  Neurological: Negative.     Allergies  Review of patient's allergies indicates no known allergies.  Home Medications   Prior to Admission medications   Medication Sig Start Date End Date Taking? Authorizing Provider  amLODipine (NORVASC) 10 MG tablet TAKE 1 TABLET EVERY DAY 02/12/14  Yes Lisabeth Pick, MD  aspirin 81 MG tablet Take 81 mg by mouth daily.     Yes Historical Provider, MD  chlorthalidone (HYGROTON) 25 MG tablet TAKE ONE TABLET BY MOUTH EVERY DAY 02/12/14  Yes Lisabeth Pick, MD  Docusate Calcium (STOOL SOFTENER PO) Take by mouth as needed.   Yes Historical Provider, MD  donepezil (ARICEPT) 5 MG tablet Twice a day by mouth. 05/14/14  Yes Carmen  Dohmeier, MD  FLUZONE HIGH-DOSE 0.5 ML [facility] had 04-29-14  Flu vacc 04/29/14  Yes Historical Provider, MD  guaiFENesin-codeine 100-10 MG/5ML syrup Take 5 mLs by mouth at bedtime as needed for cough. 04/14/14  Yes Gregor Hams, MD  levETIRAcetam (KEPPRA) 500 MG tablet Take 1 tablet (500 mg total) by mouth 2 (two) times daily. 05/14/14  Yes Carmen Dohmeier, MD  Multiple Vitamin (MULTIVITAMIN) capsule Take 1 capsule by mouth daily.     Yes Historical Provider, MD  simvastatin (ZOCOR) 40 MG tablet TAKE ONE TABLET BY MOUTH ONCE DAILY 08/22/13  Yes Bruce Kendall Flack, MD  predniSONE (DELTASONE) 20 MG tablet Take 3 tabs po on first day, 2 tabs second day, 2 tabs third day, 1 tab fourth day, 1 tab 5th day. Take with food. 05/19/14   Janne Napoleon, NP   BP 159/84 mmHg  Pulse 75  Temp(Src) 98.5 F (36.9 C) (Oral)  Resp 16  SpO2 96% Physical Exam  Constitutional: He is oriented to person, place, and time. He appears well-developed and well-nourished.  HENT:  Head: Normocephalic and atraumatic.  Eyes: EOM are normal.  Cardiovascular: Normal rate.   Pulmonary/Chest: Effort normal. No respiratory distress.  Musculoskeletal:  L index finger with swelling and tenderness primarily over the DIP but involving portions of the proximal and middle phalanges.  Flexion 5-10 deg. Cap RF <2 sec. Sensation intact.  Neurological: He is alert and oriented  to person, place, and time. No cranial nerve deficit.  Skin: Skin is warm and dry.  Psychiatric: He has a normal mood and affect.  Nursing note and vitals reviewed.   ED Course  Procedures (including critical care time) Labs Review Labs Reviewed - No data to display  Imaging Review Dg Finger Index Left  05/19/2014   CLINICAL DATA:  Pain and swelling with tenderness of the left index finger. Initial encounter  EXAM: LEFT INDEX FINGER 2+V  COMPARISON:  None.  FINDINGS: There is fusiform soft tissue swelling about the second PIP joint. The joint is narrowed but there  is no subchondral bone erosion. Small marginal erosion noted along the radial surface of the joint, both proximally and distally. No foreign body or subcutaneous gas. No acute fracture dislocation. Typical degenerative changes seen in the second DIP and third interphalangeal joints.  IMPRESSION: Soft tissue swelling and mild erosive changes around the second PIP joint. This could represent an inflammatory or infectious arthritis.   Electronically Signed   By: Jorje Guild M.D.   On: 05/19/2014 12:39     MDM   1. Degenerative arthritis of finger, left   2. Pain   3. Swelling    Consulted with Dr. Georgina Snell. Believed most likely inflammatory rather than infectious. Tx with course of Prednisone a d F/U with PCP this week, Sooner here or ED for problems    Janne Napoleon, NP 05/19/14 1303

## 2014-05-20 ENCOUNTER — Telehealth: Payer: Self-pay | Admitting: *Deleted

## 2014-05-20 DIAGNOSIS — G3184 Mild cognitive impairment, so stated: Secondary | ICD-10-CM

## 2014-05-20 DIAGNOSIS — F0390 Unspecified dementia without behavioral disturbance: Secondary | ICD-10-CM

## 2014-05-20 MED ORDER — DONEPEZIL HCL 5 MG PO TABS: 5.0000 mg | ORAL_TABLET | Freq: Two times a day (BID) | ORAL | Status: DC

## 2014-05-20 NOTE — Telephone Encounter (Signed)
Rx has been sent  

## 2014-05-20 NOTE — Telephone Encounter (Signed)
Refill request for  donepezil 5mg 

## 2014-05-23 ENCOUNTER — Telehealth: Payer: Self-pay | Admitting: Neurology

## 2014-05-23 NOTE — Telephone Encounter (Signed)
Isaiah Davidson with [employer] @ [phone] x [phone], requesting Rx donepezil (ARICEPT) 5 MG tablet dosage frequency and instructions.  Please call and advise.

## 2014-05-23 NOTE — Telephone Encounter (Signed)
I called back.  Spoke with Tim.  Verified directions.  He has noted account and will forward this info to the appropriate dept.

## 2014-05-25 ENCOUNTER — Telehealth: Payer: Self-pay

## 2014-05-25 NOTE — Telephone Encounter (Signed)
[employer] notified us they have approved the request for coverage on Donepezil effective until 05/24/2015 Ref # [id number]

## 2014-05-26 ENCOUNTER — Ambulatory Visit (INDEPENDENT_AMBULATORY_CARE_PROVIDER_SITE_OTHER): Payer: Medicare PPO | Admitting: Family Medicine

## 2014-05-26 ENCOUNTER — Encounter: Payer: Self-pay | Admitting: Family Medicine

## 2014-05-26 ENCOUNTER — Telehealth: Payer: Self-pay | Admitting: Neurology

## 2014-05-26 VITALS — BP 132/70 | HR 77 | Temp 98.1°F | Wt 179.0 lb

## 2014-05-26 DIAGNOSIS — M10042 Idiopathic gout, left hand: Secondary | ICD-10-CM

## 2014-05-26 DIAGNOSIS — M79645 Pain in left finger(s): Secondary | ICD-10-CM

## 2014-05-26 MED ORDER — PREDNISONE 20 MG PO TABS: ORAL_TABLET | ORAL | Status: DC

## 2014-05-26 NOTE — Patient Instructions (Signed)
Could be gout. Dont think this is an infection.  See me back in 10-14 days. If you had fever, worsening pain, come see [place] sooner.

## 2014-05-26 NOTE — Telephone Encounter (Signed)
Rx was already sent to [facility] on 11/10.  I called the pharmacy.  Spoke with [employer].  She said they do have the Rx, and it will be ready in a couple hours.  I called the patient back.  They are aware.

## 2014-05-26 NOTE — Telephone Encounter (Signed)
Patient spouse stated Mail order has mailed out Rx for donepezil (ARICEPT) 5 MG tablet.  Patient completely out of medication and questioning if Rx refill request to be sent to  [facility] on [address] for 5 day supply.  Please call and advise.

## 2014-05-26 NOTE — Progress Notes (Signed)
  [name], MD Phone: [phone]  Subjective:   Isaiah Davidson is a 80 y.o. year old very pleasant male patient who presents with the following:  Left 2nd finger pain/swelling 2nd finger on left hand swollen and painful for 2 weeks. Went to urgent care a week ago and had 5 day course of prednisone. Reviewed records and had x-ray with soft tissue swelling as well as degenerative changes. Issue thought to be inflammatory instead of infectious.  Pain/swelling Was improving then came back as soon as he stopped prednisone. 10/10 aching pain. No radiation. Never had anything like this before. Patient denies trauma or injury. States just started all of a sudden.  ROS-warmth and erythema at the joint noted. No fevers chills nausea vomiting. No unintentional weight loss.  Past Medical History- mild cognitive impairment with memory loss, Seizure disorder, HLD, HTN  Medications- reviewed and updated Current Outpatient Prescriptions  Medication Sig Dispense Refill  . amLODipine (NORVASC) 10 MG tablet TAKE 1 TABLET EVERY DAY 90 tablet 1  . aspirin 81 MG tablet Take 81 mg by mouth daily.      . chlorthalidone (HYGROTON) 25 MG tablet TAKE ONE TABLET BY MOUTH EVERY DAY 90 tablet 1  . donepezil (ARICEPT) 5 MG tablet Take 1 tablet (5 mg total) by mouth 2 (two) times daily. 180 tablet 2  . levETIRAcetam (KEPPRA) 500 MG tablet Take 1 tablet (500 mg total) by mouth 2 (two) times daily. 180 tablet 2  . Multiple Vitamin (MULTIVITAMIN) capsule Take 1 capsule by mouth daily.      . simvastatin (ZOCOR) 40 MG tablet TAKE ONE TABLET BY MOUTH ONCE DAILY 90 tablet 3  . Docusate Calcium (STOOL SOFTENER PO) Take by mouth as needed.    [name] Kitchen guaiFENesin-codeine 100-10 MG/5ML syrup Take 5 mLs by mouth at bedtime as needed for cough. 120 mL 0   No current facility-administered medications for this visit.    Objective: BP 132/70 mmHg  Pulse 77  Temp(Src) 98.1 F (36.7 C)  Wt 179 lb (81.194 kg) CV: 2+ pulses at  the wrist (radial and ulnar)  Ext: no edema in extremities Left 2nd finger at PIP with swelling and warmth. Very tender to palpation. Can move finger to about 30 degrees.  Skin: warm, dry, warmth and redness over 2nd PIP Neuro: intact distal sensation   Assessment/Plan:  Left 2nd finger pain/swelling Presumed due to gout or pseudogout. CKD stage III on last lab so hold off on nsaids although would be reasonable to try as more borderline II/III. Prednisone very beneficial previously so will give longer course/taper. Follow up 10-14 days with 15 day taper medicine. Warning signs/Return precautions advised. Doubt infection or osteomyelitis strongly. Never had gout before-consider uric acid when not in flare. Wouldn't plan on uric acid lowering agent unless had more than 1 event like this.  Meds ordered this encounter  Medications  . predniSONE (DELTASONE) 20 MG tablet    Sig: Take 2 pills for 3 days, then 1 pill for 3 days, then 1/2 pill for 3 days, then 1/2 pill every other day for 3 days (for 3 doses) then stop. See me back in 10-14 days    Dispense:  15 tablet    Refill:  0

## 2014-05-29 ENCOUNTER — Ambulatory Visit (INDEPENDENT_AMBULATORY_CARE_PROVIDER_SITE_OTHER): Payer: Medicare PPO

## 2014-05-29 DIAGNOSIS — F0390 Unspecified dementia without behavioral disturbance: Secondary | ICD-10-CM

## 2014-05-29 DIAGNOSIS — G3184 Mild cognitive impairment, so stated: Secondary | ICD-10-CM

## 2014-05-29 DIAGNOSIS — G40209 Localization-related (focal) (partial) symptomatic epilepsy and epileptic syndromes with complex partial seizures, not intractable, without status epilepticus: Secondary | ICD-10-CM

## 2014-06-02 NOTE — Procedures (Signed)
[facility]  EEG (ELECTROENCEPHALOGRAM) REPORT   STUDY DATE:   05-29-14 PATIENT NAME:  Isaiah Davidson.  MRN:  [id number]   ORDERING CLINICIAN: Larey Seat, MD   TECHNOLOGIST: Ronnald Ramp  TECHNIQUE: Electroencephalogram was recorded utilizing standard 10-20 system of lead placement and reformatted into average and bipolar montages.      RECORDING TIME:  43.9 minutes  ACTIVATION:  strobe lights and hyperventilation    CLINICAL INFORMATION:  Mr. [name] is a 80 year old gentleman presenting with cognitive impairment and a history long-standing of seizures. He had achieved good seizure control on the current medication and was followed by Dr. Jeneen Rinks love. His primary care physician is Dr. Phoebe Sharps  The patient's family has noted that he seems to be slower in processing information and that he at times loses train of thought.     FINDINGS: EEG  Background rhythm of 10 hertz . The  EKG heart rates varied from 64-69 bpm in NSR.   Photic stimulation caused  no epileptiform discharges or isolated spikes or sharp waves. Photic stimulation led to eyeblink artifact and entrainment at 7, 9, and 11 Hz. The patient became drowsy but did not fall asleep during this recording. Also fast activity dominated the EEG in a symmetric fashion that was temporarily slowing noted with hyperventilation over the P4 region. This would be considered a mild dysrhythmia and by itself him without knowing the clinical history I would not call this a seizure focus. The patient's EEG confirms a good control of his seizure potential.  Sincerely, Larey Seat M.D.     IMPRESSION:  EEG with mild dysrhythmia , right parietal lobe,  P4.       Larey Seat , MD

## 2014-06-10 ENCOUNTER — Encounter: Payer: Self-pay | Admitting: Family Medicine

## 2014-06-10 ENCOUNTER — Ambulatory Visit (INDEPENDENT_AMBULATORY_CARE_PROVIDER_SITE_OTHER): Payer: Medicare PPO | Admitting: Family Medicine

## 2014-06-10 VITALS — BP 130/70 | Temp 98.4°F | Wt 177.0 lb

## 2014-06-10 DIAGNOSIS — M79645 Pain in left finger(s): Secondary | ICD-10-CM

## 2014-06-10 NOTE — Patient Instructions (Signed)
We are going to send you to Dr. Tamala Julian of sports medicine to consider pulling fluid off the joint if able and possible injecting steroid into the joint.

## 2014-06-10 NOTE — Progress Notes (Signed)
  [name], MD Phone: [phone]  Subjective:   Isaiah Davidson is a 80 y.o. year old very pleasant male patient who presents with the following:  Left 2nd finger pain/swelling >3 weeks of symptoms. Urgent care initially with 5 day course of prednisone with recurrence of symptoms when stopped taking medication. See last note for further information about initial urgent care visit. I used a 12 day taper of prednisone and when he went to every other day he had a flare of symptoms. He states the swelling had gone down greatly and then reemerged along with pain, redness, tenderness of the joint (though less severe than last reoccurence). No other digits affected. Pain previously 10/10 but now moderate and can bend finger more than previous.   ROS-warmth and erythema at the joint noted. No fevers chills nausea vomiting. No unintentional weight loss.  Past Medical History- mild cognitive impairment with memory loss, Seizure disorder, HLD, HTN  Medications- reviewed and updated Current Outpatient Prescriptions  Medication Sig Dispense Refill  . amLODipine (NORVASC) 10 MG tablet TAKE 1 TABLET EVERY DAY 90 tablet 1  . aspirin 81 MG tablet Take 81 mg by mouth daily.      . chlorthalidone (HYGROTON) 25 MG tablet TAKE ONE TABLET BY MOUTH EVERY DAY 90 tablet 1  . Docusate Calcium (STOOL SOFTENER PO) Take by mouth as needed.    . donepezil (ARICEPT) 5 MG tablet Take 1 tablet (5 mg total) by mouth 2 (two) times daily. 180 tablet 2  . levETIRAcetam (KEPPRA) 500 MG tablet Take 1 tablet (500 mg total) by mouth 2 (two) times daily. 180 tablet 2  . Multiple Vitamin (MULTIVITAMIN) capsule Take 1 capsule by mouth daily.      . simvastatin (ZOCOR) 40 MG tablet TAKE ONE TABLET BY MOUTH ONCE DAILY 90 tablet 3  . guaiFENesin-codeine 100-10 MG/5ML syrup Take 5 mLs by mouth at bedtime as needed for cough. (Patient not taking: Reported on 06/10/2014) 120 mL 0     Objective: BP 130/70 mmHg  Temp(Src) 98.4 F  (36.9 C)  Wt 177 lb (80.287 kg) CV: 2+ pulses at the wrist (radial and ulnar) remains Ext: no edema in extremities Left 2nd finger at PIP with swelling and warmth but less so than last visit. Moderate tenderness to palpation. Can move joint slightly more than 30 degrees.  Skin: warm, dry, warmth and redness over 2nd PIP Neuro: intact distal sensation   Assessment/Plan:  Left 2nd finger pain/swelling Presumed due to gout or pseudogout still-recurred after 5 day and 12 day course of prednisone but improved while on medication. Interesting this is first occurrence of gout or pseudogout at age 14 though. CKD stage III on last lab so still held off on NSAIDs/colchicine though low dose likely reasonable. Sending to sports medicine for evaluation. Tough joint but if aspiration possible for analysis would be beneficial. If gout, likely need to come off of chlorthalidone. I also think intra articular steroid injection may benefit patient but await Dr. Thompson Caul expertise/opinion.   Never had gout before-consider uric acid when not in flare (at our January visit).

## 2014-06-11 ENCOUNTER — Encounter: Payer: Self-pay | Admitting: Family Medicine

## 2014-06-11 ENCOUNTER — Other Ambulatory Visit (INDEPENDENT_AMBULATORY_CARE_PROVIDER_SITE_OTHER): Payer: Medicare PPO

## 2014-06-11 ENCOUNTER — Ambulatory Visit (INDEPENDENT_AMBULATORY_CARE_PROVIDER_SITE_OTHER): Payer: Medicare PPO | Admitting: Family Medicine

## 2014-06-11 VITALS — BP 134/80 | HR 74 | Ht 70.25 in | Wt 177.0 lb

## 2014-06-11 DIAGNOSIS — M79645 Pain in left finger(s): Secondary | ICD-10-CM

## 2014-06-11 DIAGNOSIS — L089 Local infection of the skin and subcutaneous tissue, unspecified: Secondary | ICD-10-CM

## 2014-06-11 MED ORDER — CIPROFLOXACIN HCL 500 MG PO TABS: 500.0000 mg | ORAL_TABLET | Freq: Two times a day (BID) | ORAL | Status: DC

## 2014-06-11 NOTE — Progress Notes (Signed)
[facility] Isaiah Davidson,  [id number] Phone: [phone] Subjective:    I'm seeing this patient by the request  of:  Dr. Yong Channel  CC: Left index finger swelling and pain  [id number] Isaiah Davidson is a 80 y.o. male coming in with complaint of  Left finger pain and swelling. Patient has had these symptoms for proximally one month. Patient was initially seen at urgent care facility and was given a diagnosis of mostly degenerative arthritis of the finger. Patient at that time was given a prednisone Dosepak which was somewhat beneficial. Patient had x-rays done as well. These x-rays were reviewed by me and there was a potential for degenerative breakdown of the PIP joint that was either inflammatory versus infectious etiology. Patient states when taking the prednisone it did seem to help some of the swelling. Patient continued to have the pain though. 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 Due to patient's chronic kidney disease patient avoided anti-inflammatories. Patient was once again put on prednisone and did notice some improvement. Patient started though to taper the prednisone and it seemed to worsen. Patient states he cannot move it and not use his hand for certain daily activities due to the pain.    Past medical history, social, surgical and family history all reviewed in electronic medical record.   Review of Systems: No headache, visual changes, nausea, vomiting, diarrhea, constipation, dizziness, abdominal pain, skin rash, fevers, chills, night sweats, weight loss, swollen lymph nodes, body aches, joint swelling, muscle aches, chest pain, shortness of breath, mood changes.   Objective Blood pressure 134/80, pulse 74, height 5' 10.25" (1.784 m), weight 177 lb (80.287 kg), SpO2 96 %.  General: No apparent distress alert and oriented x3 mood and affect normal,  dressed appropriately.  HEENT: Pupils equal, extraocular movements intact  Respiratory: Patient's speak in full sentences and does not appear short of breath  Cardiovascular: No lower extremity edema, non tender, no erythema  Skin: Warm dry intact with no signs of infection or rash on extremities or on axial skeleton.  Abdomen: Soft nontender  Neuro: Cranial nerves II through XII are intact, neurovascularly intact in all extremities with 2+ DTRs and 2+ pulses.  Lymph: No lymphadenopathy of posterior or anterior cervical chain or axillae bilaterally.  Gait normal with good balance and coordination.  MSK:  Non tender with full range of motion and good stability and symmetric strength and tone of shoulders, elbows, wrist, hip, knee and ankles bilaterally. Moderate osteophytic changes of multiple joints. Moderate osteoarthritic changes of multiple joints.  Hand exam shows that patient's index finger on the left hand does have significant swelling mostly of the PIP joint all the way to the MP joint as well as to the distal phalanx. Patient is neurovascularly intact. Severe tenderness over the PIP joint itself. Unable to bend greater than 15 and lacks the last 3 of extension. Warm to touch.  Limited [place] skeletal ultrasound was performed and interpreted by Hulan Saas, M  Limited ultrasound the patient's first finger shows the patient does have significant degenerative changes of the PIP joints will continue to have mild arthritis of surrounding joints. There appears to be lucency around the bones which is suggestive of inflammatory versus infectious etiology. Overlying soft tissue swelling noted. Questionable gouty deposits but not pathognomonic as usually seen.  Impression: Inflammatory versus possible infection.  Procedure: Real-time Ultrasound Guided aspiration  Device: GE Logiq E  Ultrasound guided injection is preferred based studies that show increased duration, increased effect, greater  accuracy, decreased procedural pain, increased response rate, and decreased cost with ultrasound guided versus blind injection.  Verbal informed consent obtained.  Time-out conducted.  Noted no overlying erythema, induration, or other signs of local infection.  Skin prepped in a sterile fashion.  Local anesthesia: Topical Ethyl chloride.  With sterile technique and under real time ultrasound guidance:  The 25-gauge 1 inch needle patient was injected with 0.5 mL of 0.5% Marcaine. Attempted to aspirate joint and was not successful but when needle was removed unfortunately pus was seen in with pressure more pus was removed. Unable to get enough to send to lab. Approximately 2 mL of pus removed. Completed without difficulty  Pain immediately resolved suggesting accurate placement of the medication.  Advised to call if fevers/chills, erythema, induration, drainage, or persistent bleeding.  Images permanently stored and available for review in the ultrasound unit.  Impression: Technically successful ultrasound guided aspiration    Impression and Recommendations:     This case required medical decision making of moderate complexity.

## 2014-06-11 NOTE — Patient Instructions (Signed)
Good to see you Ice 10 minutes 2 times a day I think you have an infection at this time.  Cipro 2 times daily for 3 weeks.  See me again 1-2 weeks.  Before you see me get an xray downstairs

## 2014-06-11 NOTE — Assessment & Plan Note (Signed)
Discussed with patient at great length. It appears the patient does have an infection. We are going to hold that this is mostly a cellulitic change but unfortunately osteomyelitis is high on the differential. We discussed different potential treatment options at this time. Likely over the course of the last 3-4 weeks patient has not had any significant worsening but has not had any improvement either. With patient and other comorbidities we will hope that this is more of a gram-negative type infection and will be treated with ciprofloxacin. Patient does not make any significant improvement I would consider changing or adding doxycycline to cover for more of a MRSA. Patient will come back in 1 week for further evaluation and we will get x-rays to see if there is any further deterioration of the bone. If patient has any systemic illnesses such as fever or chills he will seek medical attention immediately.

## 2014-06-23 ENCOUNTER — Ambulatory Visit (INDEPENDENT_AMBULATORY_CARE_PROVIDER_SITE_OTHER): Payer: Medicare PPO | Admitting: Family Medicine

## 2014-06-23 ENCOUNTER — Ambulatory Visit (INDEPENDENT_AMBULATORY_CARE_PROVIDER_SITE_OTHER)
Admission: RE | Admit: 2014-06-23 | Discharge: 2014-06-23 | Disposition: A | Discharge status: Home / Self Care | Payer: Medicare PPO | Source: Ambulatory Visit | Attending: Family Medicine | Admitting: Family Medicine

## 2014-06-23 ENCOUNTER — Encounter: Payer: Self-pay | Admitting: Family Medicine

## 2014-06-23 VITALS — BP 128/62 | HR 75 | Ht 70.5 in | Wt 174.0 lb

## 2014-06-23 DIAGNOSIS — M79645 Pain in left finger(s): Secondary | ICD-10-CM

## 2014-06-23 DIAGNOSIS — L089 Local infection of the skin and subcutaneous tissue, unspecified: Secondary | ICD-10-CM

## 2014-06-23 NOTE — Assessment & Plan Note (Signed)
Patient is improving at this time. X-rays do not show any significant change from previous x-rays. Patient though clinically has improved significantly. Discussed with patient to continue with the Cipro at this time. We will not make any significant other changes secondary to patient's other comorbidities. Discussed with patient about the possibility of this being an osteomyelitis in the potential side effects. We discussed different treatment options and treatment workup including CT scan for further evaluation for osteomyelitis. Patient declined at this time. Patient will continue with conservative therapy but will come back in 2-3 weeks for further evaluation and treatment.  Spent greater than 25 minutes with patient face-to-face and had greater than 50% of counseling including as described above in assessment and plan.

## 2014-06-23 NOTE — Patient Instructions (Addendum)
Good to see you Isaiah Davidson is your friend [name] the antibiotics.  Try the pennsaid up to 2 times daily.  See me again in 3 weeks.

## 2014-06-23 NOTE — Progress Notes (Signed)
  [facility] [place],  [address] Phone: [phone] Subjective:     CC: Left index finger swelling and pain follow-up  POE:UMPNTIRWER Isaiah Davidson is a 80 y.o. male coming in with complaint of  Left finger pain and swelling. Patient was seen previously and there is a chance for potential infection as well as osteomyelitis. Patient elected to try conservative therapy and was given antibiotics. Patient states he is feeling significantly better. Patient is able to use his hand. Denies any side effects to the medication. Patient states every day it seems to be improving. Denies any new symptoms.    Past medical history, social, surgical and family history all reviewed in electronic medical record.   Review of Systems: No headache, visual changes, nausea, vomiting, diarrhea, constipation, dizziness, abdominal pain, skin rash, fevers, chills, night sweats, weight loss, swollen lymph nodes, body aches, joint swelling, muscle aches, chest pain, shortness of breath, mood changes.   Objective Blood pressure 128/62, pulse 75, height 5' 10.5" (1.791 m), weight 174 lb (78.926 kg), SpO2 95 %.  General: No apparent distress alert and oriented x3 mood and affect normal, dressed appropriately.  HEENT: Pupils equal, extraocular movements intact  Respiratory: Patient's speak in full sentences and does not appear short of breath  Cardiovascular: No lower extremity edema, non tender, no erythema  Skin: Warm dry intact with no signs of infection or rash on extremities or on axial skeleton.  Abdomen: Soft nontender  Neuro: Cranial nerves II through XII are intact, neurovascularly intact in all extremities with 2+ DTRs and 2+ pulses.  Lymph: No lymphadenopathy of posterior or anterior cervical chain or axillae bilaterally.  Gait normal with good balance and coordination.  MSK:  Non tender with full range of motion and good stability and symmetric strength and  tone of shoulders, elbows, wrist, hip, knee and ankles bilaterally. Moderate osteophytic changes of multiple joints. Moderate osteoarthritic changes of multiple joints.  Hand exam shows the patient does not have any more swelling over the index finger. No erythema and no longer warm to palpation. Patient does have increasing range of motion still lacking the last 10 of flexion but has full extension. Still minimally tender to palpation.  Repeat x-rays show the same inflammatory versus infectious degenerative changes noted on previous x-ray.    Impression and Recommendations:     This case required medical decision making of moderate complexity.

## 2014-07-18 ENCOUNTER — Ambulatory Visit: Payer: Medicare PPO | Admitting: Family Medicine

## 2014-07-21 ENCOUNTER — Encounter: Payer: Self-pay | Admitting: Family Medicine

## 2014-07-21 ENCOUNTER — Ambulatory Visit (INDEPENDENT_AMBULATORY_CARE_PROVIDER_SITE_OTHER): Payer: PPO | Admitting: Family Medicine

## 2014-07-21 VITALS — BP 134/76 | HR 69 | Temp 98.0°F | Ht 69.0 in | Wt 177.0 lb

## 2014-07-21 DIAGNOSIS — E785 Hyperlipidemia, unspecified: Secondary | ICD-10-CM

## 2014-07-21 DIAGNOSIS — Z23 Encounter for immunization: Secondary | ICD-10-CM

## 2014-07-21 DIAGNOSIS — I10 Essential (primary) hypertension: Secondary | ICD-10-CM

## 2014-07-21 MED ORDER — SIMVASTATIN 40 MG PO TABS: ORAL_TABLET | ORAL | Status: DC

## 2014-07-21 MED ORDER — AMLODIPINE BESYLATE 10 MG PO TABS: 10.0000 mg | ORAL_TABLET | Freq: Every day | ORAL | Status: DC

## 2014-07-21 MED ORDER — CHLORTHALIDONE 25 MG PO TABS: 25.0000 mg | ORAL_TABLET | Freq: Every day | ORAL | Status: DC

## 2014-07-21 NOTE — Progress Notes (Signed)
Pre visit review using our clinic review tool, if applicable. No additional management support is needed unless otherwise documented below in the visit note. 

## 2014-07-21 NOTE — Assessment & Plan Note (Addendum)
Well controlled with ldl 93. Continue current meds: simvastatin 40mg . Recheck 6 months (to come in fasting at that time)

## 2014-07-21 NOTE — Assessment & Plan Note (Addendum)
Well controlled. Continue current meds: Chlorthalidone, amlodipine

## 2014-07-21 NOTE — Patient Instructions (Signed)
Prevnar today (pneumonia booster)  Blood pressure and cholesterol look great-no changes  Glad the finger is better  I would like you and your wife to talk about what you would want in the event something happened to you like a heart attack. Would you want chest compressions, shock, a tube down your throat to help you breath short term? Also consider talking to a lawyer to sign HCPOA and have your wishes in writing.

## 2014-07-21 NOTE — Progress Notes (Signed)
Isaiah Reddish, MD Phone: [phone]  Subjective:  Patient presents today to establish care with me as their new primary care provider. Patient was formerly a patient of Dr. Leanne Chang. Chief complaint-noted.   Hyperlipidemia-controlled  Lab Results  Component Value Date   LDLCALC 93 09/24/2013  On statin: simvastatin 40mg  Regular exercise: does some yardwork, no sustained regular exercise ROS- no chest pain or shortness of breath. No myalgias  Hypertension-controlled  BP Readings from Last 3 Encounters:  07/21/14 134/76  06/23/14 128/62  06/11/14 134/80  Home BP monitoring-no Compliant with medications-yes without side effects ROS-Denies any CP, HA, SOB, blurry vision, LE edema.  We also discussed healed finger infection. He does not have full range of motion but no longer has intesne pain, erythema, swelling.   The following were reviewed and entered/updated in epic: Past Medical History  Diagnosis Date  . HYPERLIPIDEMIA 02/22/2007  . HYPERTENSION 02/22/2007  . Seizures     treated for siezures around 2010  . MCI (mild cognitive impairment) with memory loss 05/14/2014   Patient Active Problem List   Diagnosis Date Noted  . MCI (mild cognitive impairment) with memory loss 05/14/2014    Priority: High  . Seizure disorder 04/19/2011    Priority: High  . Finger infection 06/11/2014    Priority: Medium  . Hyperlipidemia 02/22/2007    Priority: Medium  . Essential hypertension 02/22/2007    Priority: Medium  . Carpal tunnel syndrome 08/18/2010    Priority: Low  . Cyst and pseudocyst of pancreas 04/12/2010    Priority: Low   Past Surgical History  Procedure Laterality Date  . Knee surgery      arthroscopic  . Carpal tunnel release      Family History  Problem Relation Age of Onset  . Diabetes Paternal Grandmother     raised by grandparents    Medications- reviewed and updated Current Outpatient Prescriptions  Medication Sig Dispense Refill  . amLODipine  (NORVASC) 10 MG tablet TAKE 1 TABLET EVERY DAY 90 tablet 1  . aspirin 81 MG tablet Take 81 mg by mouth daily.      . chlorthalidone (HYGROTON) 25 MG tablet TAKE ONE TABLET BY MOUTH EVERY DAY 90 tablet 1  . Docusate Calcium (STOOL SOFTENER PO) Take by mouth as needed.    . donepezil (ARICEPT) 5 MG tablet Take 1 tablet (5 mg total) by mouth 2 (two) times daily. 180 tablet 2  . Multiple Vitamin (MULTIVITAMIN) capsule Take 1 capsule by mouth daily.      . simvastatin (ZOCOR) 40 MG tablet TAKE ONE TABLET BY MOUTH ONCE DAILY 90 tablet 3   Allergies-reviewed and updated No Known Allergies  History   Social History  . Marital Status: Married    Spouse Name: [name]    Number of Children: 2  . Years of Education: 10   Occupational History  .     Social History Main Topics  . Smoking status: Former Smoker -- 1.00 packs/day for 30 years    Types: Cigars, Cigarettes    Quit date: 07/11/1978  . Smokeless tobacco: Never Used  . Alcohol Use: No  . Drug Use: No  . Sexual Activity: None   Other Topics Concern  . None   Social History Narrative   Patient is married ([name]).   Patient has 3 children. 2 grandkids. 3 greatgrandkids.    Patient is retired Administrator.    Patient is right-handed.   Patient has a 10th grade education.   Patient drinks  one cup of coffee daily.      Hobbies: working in the yard- gardening competition with neighbor on who can grow best vegetables, "messing around the house"      07/21/14- wants wife to make decisions for him. Not sure if he has advanced directives.           ROS--See HPI   Objective: BP 134/76 mmHg  Pulse 69  Temp(Src) 98 F (36.7 C) (Oral)  Ht 5\' 9"  (1.753 m)  Wt 177 lb (80.287 kg)  BMI 26.13 kg/m2 Gen: NAD, resting comfortably CV: RRR no murmurs rubs or gallops Lungs: CTAB no crackles, wheeze, rhonchi Abdomen: soft/nontender/nondistended/normal bowel sounds. No rebound or guarding.  Ext: no edema Skin: warm, dry, no  rash Neuro: grossly normal- needs wife assist for some questions such as medications, moves all extremities, PERRLA   Assessment/Plan:  Hyperlipidemia Well controlled with ldl 93. Continue current meds: simvastatin 40mg . Recheck 6 months (to come in fasting at that time)  Essential hypertension Well controlled. Continue current meds: Chlorthalidone, amlodipine   6 month follow up. We also had a long discussion about goals of care and advanced directives. I advised patient ot consider what he would want in case of heart attack or other illness. He is uncertain. His wife would make decisions for him and advised HCPOA. Considering DNR/DNI and could sign yellow forms at next visit if desired.   >50% of 30 minute office visit was spent on counseling (advanced directives and goals of care though no firm decisions made today by patient) and coordination of care   Orders Placed This Encounter  Procedures  . Pneumococcal conjugate vaccine 13-valent   Meds ordered this encounter  Medications  . simvastatin (ZOCOR) 40 MG tablet    Sig: TAKE ONE TABLET BY MOUTH ONCE DAILY    Dispense:  90 tablet    Refill:  3  . amLODipine (NORVASC) 10 MG tablet    Sig: Take 1 tablet (10 mg total) by mouth daily.    Dispense:  90 tablet    Refill:  3  . chlorthalidone (HYGROTON) 25 MG tablet    Sig: Take 1 tablet (25 mg total) by mouth daily.    Dispense:  90 tablet    Refill:  3

## 2014-07-25 ENCOUNTER — Other Ambulatory Visit (INDEPENDENT_AMBULATORY_CARE_PROVIDER_SITE_OTHER): Payer: PPO

## 2014-07-25 ENCOUNTER — Ambulatory Visit (INDEPENDENT_AMBULATORY_CARE_PROVIDER_SITE_OTHER): Payer: PPO | Admitting: Family Medicine

## 2014-07-25 ENCOUNTER — Encounter: Payer: Self-pay | Admitting: Family Medicine

## 2014-07-25 VITALS — BP 112/66 | HR 71 | Wt 176.0 lb

## 2014-07-25 DIAGNOSIS — M79644 Pain in right finger(s): Secondary | ICD-10-CM

## 2014-07-25 DIAGNOSIS — M119 Crystal arthropathy, unspecified: Secondary | ICD-10-CM | POA: Insufficient documentation

## 2014-07-25 LAB — CBC WITH DIFFERENTIAL/PLATELET
BASOS ABS: 0 10*3/uL (ref 0.0–0.1)
BASOS PCT: 0.5 % (ref 0.0–3.0)
EOS PCT: 1.6 % (ref 0.0–5.0)
Eosinophils Absolute: 0.1 10*3/uL (ref 0.0–0.7)
HCT: 40.4 % (ref 39.0–52.0)
HEMOGLOBIN: 13.6 g/dL (ref 13.0–17.0)
LYMPHS ABS: 1.7 10*3/uL (ref 0.7–4.0)
Lymphocytes Relative: 22.8 % (ref 12.0–46.0)
MCHC: 33.7 g/dL (ref 30.0–36.0)
MCV: 87.2 fl (ref 78.0–100.0)
Monocytes Absolute: 0.6 10*3/uL (ref 0.1–1.0)
Monocytes Relative: 7.6 % (ref 3.0–12.0)
NEUTROS ABS: 4.9 10*3/uL (ref 1.4–7.7)
Neutrophils Relative %: 67.5 % (ref 43.0–77.0)
Platelets: 350 10*3/uL (ref 150.0–400.0)
RBC: 4.63 Mil/uL (ref 4.22–5.81)
RDW: 13.3 % (ref 11.5–15.5)
WBC: 7.3 10*3/uL (ref 4.0–10.5)

## 2014-07-25 LAB — URIC ACID: Uric Acid, Serum: 8.4 mg/dL — ABNORMAL HIGH (ref 4.0–7.8)

## 2014-07-25 LAB — SEDIMENTATION RATE: SED RATE: 67 mm/h — AB (ref 0–22)

## 2014-07-25 LAB — C-REACTIVE PROTEIN: CRP: 3.9 mg/dL (ref 0.5–20.0)

## 2014-07-25 MED ORDER — FEBUXOSTAT 80 MG PO TABS: 1.0000 | ORAL_TABLET | Freq: Every day | ORAL | Status: DC

## 2014-07-25 NOTE — Progress Notes (Signed)
  [facility] [place], [place] [address] Phone: [phone] Subjective:     CC: Left index finger swelling and pain follow-up  Isaiah Davidson is a 81 y.o. male coming in with complaint of  Left finger pain and swelling. Patient was seen previously and there is a chance for potential infection as well as osteomyelitis. Patient was given oral anti-biotics for total of 4 weeks. Patient has finished that at this time. Patient states his other fingers doing very well. Patient unfortunately though states that the contralateral hand middle finger is having a very similar presentation. Patient does not report any injury and states that it just occurred when he woke up again. Denies any bug bites, is sore and somewhat warm to touch. Denies any fevers or chills or any abnormal weight loss..    Past medical history, social, surgical and family history all reviewed in electronic medical record.   Review of Systems: No headache, visual changes, nausea, vomiting, diarrhea, constipation, dizziness, abdominal pain, skin rash, fevers, chills, night sweats, weight loss, swollen lymph nodes, body aches, joint swelling, muscle aches, chest pain, shortness of breath, mood changes.   Objective Blood pressure 112/66, pulse 71, weight 176 lb (79.833 kg), SpO2 95 %.  General: No apparent distress alert and oriented x3 mood and affect normal, dressed appropriately.  HEENT: Pupils equal, extraocular movements intact  Respiratory: Patient's speak in full sentences and does not appear short of breath  Cardiovascular: No lower extremity edema, non tender, no erythema  Skin: Warm dry intact with no signs of infection or rash on extremities or on axial skeleton.  Abdomen: Soft nontender  Neuro: Cranial nerves II through XII are intact, neurovascularly intact in all extremities with 2+ DTRs and 2+ pulses.  Lymph: No lymphadenopathy of posterior or anterior cervical  chain or axillae bilaterally.  Gait normal with good balance and coordination.  MSK:  Non tender with full range of motion and good stability and symmetric strength and tone of shoulders, elbows, wrist, hip, knee and ankles bilaterally. Moderate osteophytic changes of multiple joints. Moderate osteoarthritic changes of multiple joints.  Right Hand exam shows the patient has swelling over the right PIP middle finger. Mild warm to palpation. Lacks last 2 of extension and 5 of flexion  Limited muscular skeletal ultrasound was performed and interpreted by me today. Limited ultrasound showed that patient did have mild hypoechoic changes in this joint. Patient's joint has moderate osteoarthritic changes.  After verbal consent patient was prepped with alcohol swabs and with a 25-gauge half-inch needle patient was injected with 0.5 mL of 0.5% Marcaine. Patient then had aspiration of mild blood-tinged fluid. Patient was injected with her 0.5 mL of Kenalog 41 g/dL. Post injection instructions given.     Impression and Recommendations:     This case required medical decision making of moderate complexity.

## 2014-07-25 NOTE — Progress Notes (Signed)
Pre visit review using our clinic review tool, if applicable. No additional management support is needed unless otherwise documented below in the visit note. 

## 2014-07-25 NOTE — Assessment & Plan Note (Signed)
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 I do feel that further workup may be necessary and aspiration was done. We will send labs for crystal evaluation. In addition to this we will get patient a uric acid level as well as a CBC to make sure there is not a recurrent infection in a different joint. In addition to this patient was started on uloric to avoid any kidney abnormality centered occur from other medications. Patient and will come back and see me again in 2-3 weeks.

## 2014-07-25 NOTE — Patient Instructions (Addendum)
Good to see you Isaiah Davidson is your friend Get labs downstairs today.  I will call you with the labs Try the Uloric daily  We may need to change a blood pressure medicine See me again in 1-2 weeks.

## 2014-07-28 LAB — ANTI-NUCLEAR AB-TITER (ANA TITER)

## 2014-07-28 LAB — ANA: Anti Nuclear Antibody(ANA): POSITIVE — AB

## 2014-08-08 ENCOUNTER — Encounter: Payer: Self-pay | Admitting: Family Medicine

## 2014-08-08 ENCOUNTER — Telehealth: Payer: Self-pay | Admitting: Family Medicine

## 2014-08-08 ENCOUNTER — Ambulatory Visit (INDEPENDENT_AMBULATORY_CARE_PROVIDER_SITE_OTHER): Payer: PPO | Admitting: Family Medicine

## 2014-08-08 VITALS — BP 128/80 | HR 74 | Ht 69.0 in | Wt 176.0 lb

## 2014-08-08 DIAGNOSIS — M119 Crystal arthropathy, unspecified: Secondary | ICD-10-CM

## 2014-08-08 MED ORDER — SIMVASTATIN 40 MG PO TABS: ORAL_TABLET | ORAL | Status: DC

## 2014-08-08 MED ORDER — AMLODIPINE BESYLATE 10 MG PO TABS: 10.0000 mg | ORAL_TABLET | Freq: Every day | ORAL | Status: DC

## 2014-08-08 NOTE — Telephone Encounter (Signed)
Pt never received simvastatin and amlodipine #90 EACH w/refills . Please call [facility] at [phone]

## 2014-08-08 NOTE — Progress Notes (Signed)
  [facility] [place], [place] [address] Phone: [phone] Subjective:     CC: Left index finger swelling and pain follow-up  QPY:[id number] MAHESH SIZEMORE is a 81 y.o. male coming in with complaint of  Left finger pain and swelling. Patient was actually seen previously and was diagnosed with more of a gout flare. Patient though secondary to not responding well to the allopurinol switched over to uloric.  Patient is here for further follow-up. Patient did have more of an osteomyelitis of the left index finger but has improved. Patient states that he still has some tightness in that finger but overall not as much pain and no swelling. Patient has denied any fevers or chills. States that taking the new medication has helped out a lot of his other joint pains. Patient feels that he is doing better. This is not stopping him from any activities.  .    Past medical history, social, surgical and family history all reviewed in electronic medical record.   Review of Systems: No headache, visual changes, nausea, vomiting, diarrhea, constipation, dizziness, abdominal pain, skin rash, fevers, chills, night sweats, weight loss, swollen lymph nodes, body aches, joint swelling, muscle aches, chest pain, shortness of breath, mood changes.   Objective Blood pressure 128/80, pulse 74, height 5\' 9"  (1.753 m), weight 176 lb (79.833 kg), SpO2 95 %.  General: No apparent distress alert and oriented x3 mood and affect normal, dressed appropriately.  HEENT: Pupils equal, extraocular movements intact  Respiratory: Patient's speak in full sentences and does not appear short of breath  Cardiovascular: No lower extremity edema, non tender, no erythema  Skin: Warm dry intact with no signs of infection or rash on extremities or on axial skeleton.  Abdomen: Soft nontender  Neuro: Cranial nerves II through XII are intact, neurovascularly intact in all extremities with 2+ DTRs  and 2+ pulses.  Lymph: No lymphadenopathy of posterior or anterior cervical chain or axillae bilaterally.  Gait normal with good balance and coordination.  MSK:  Non tender with full range of motion and good stability and symmetric strength and tone of shoulders, elbows, wrist, hip, knee and ankles bilaterally. Moderate osteophytic changes of multiple joints. Moderate osteoarthritic changes of multiple joints.  Left hand shows the patient still has some chronic degenerative changes of the PIP joint of the index finger on the left hand. Patient has a limitation lacking the last 10 of flexion of this finger. Very minimally tender to palpation. Otherwise no signs of infection with erythema or warmth to the touch.  Limited muscular skeletal ultrasound was performed and interpreted by me today. Limited ultrasound showed that patient did have mild hypoechoic changes in this joint. Patient does have severe osteophytic changes of this finger.       Impression and Recommendations:     This case required medical decision making of moderate complexity.

## 2014-08-08 NOTE — Telephone Encounter (Signed)
Meds faxed to [facility], pt aware.

## 2014-08-08 NOTE — Patient Instructions (Signed)
Good to see you You are doing well.  Ice is your friend [place] twice daily OCntinue the uloric See me when you need me or want to try an injection.

## 2014-08-08 NOTE — Assessment & Plan Note (Signed)
Patient's uric acid level was 8.4. Patient will need to continue to be on the uric acid lowering medication probably forever. I would like to continue this medication so we can avoid patient's renal disease. We discussed with patient's finger that likely most of this is secondary to arthritis. We discussed an icing regimen as well as topical anti-inflammatory. Patient will try to continue to do this with conservative therapy. Patient has any worsening pain he will come back immediately. I do not think that there is any residual infection still left but I do feel that there is severe arthritis of this joint. Patient could respond to an injection if necessary.  Spent  25 minutes with patient face-to-face and had greater than 50% of counseling including as described above in assessment and plan.

## 2014-08-08 NOTE — Progress Notes (Signed)
Pre visit review using our clinic review tool, if applicable. No additional management support is needed unless otherwise documented below in the visit note. 

## 2014-08-18 ENCOUNTER — Telehealth: Payer: Self-pay | Admitting: *Deleted

## 2014-08-18 ENCOUNTER — Other Ambulatory Visit: Payer: Self-pay

## 2014-08-18 DIAGNOSIS — F0390 Unspecified dementia without behavioral disturbance: Secondary | ICD-10-CM

## 2014-08-18 DIAGNOSIS — G3184 Mild cognitive impairment, so stated: Secondary | ICD-10-CM

## 2014-08-18 MED ORDER — LEVETIRACETAM 500 MG PO TABS: 500.0000 mg | ORAL_TABLET | Freq: Two times a day (BID) | ORAL | Status: DC

## 2014-08-18 MED ORDER — DONEPEZIL HCL 5 MG PO TABS: 5.0000 mg | ORAL_TABLET | Freq: Two times a day (BID) | ORAL | Status: DC

## 2014-08-18 NOTE — Telephone Encounter (Signed)
I called back to verify which pharmacy they want refill sent to.  They have requested Rx at [facility].  Rx has been sent.

## 2014-08-18 NOTE — Telephone Encounter (Signed)
Patient wife calling stating that she wants the patient to go back on Levetiracetam 500 mg. The Chlorthalidone 25 mg is not working. Patient had a seizure. Please advise

## 2014-08-18 NOTE — Telephone Encounter (Signed)
I called wife.   She thought that pt was not to take the keppra when refilled aricept.  I told her that these are 2 different medications.    Keppra for seizures, and Aricept for memory.   He is to stay on both.   Need refills on keppra. (wife uses generic names of medications).

## 2014-08-18 NOTE — Telephone Encounter (Signed)
The Donepezil is making him sick .  She made a mistake in the previous call when she mentioned the Chlorthalidone, disregard that part.  Needs refill of Levetiracetam sent to [facility] on [address].

## 2014-08-18 NOTE — Telephone Encounter (Signed)
Pt states [facility] has never received this rx amLODipine (NORVASC) 10 MG tablet. Can you resend to [facility]. ( It may have been sent to [facility])

## 2014-08-18 NOTE — Telephone Encounter (Signed)
Form refaxed to [facility]

## 2014-09-22 ENCOUNTER — Ambulatory Visit: Payer: PPO | Admitting: Family Medicine

## 2014-10-08 ENCOUNTER — Other Ambulatory Visit (INDEPENDENT_AMBULATORY_CARE_PROVIDER_SITE_OTHER): Payer: PPO

## 2014-10-08 ENCOUNTER — Ambulatory Visit (INDEPENDENT_AMBULATORY_CARE_PROVIDER_SITE_OTHER): Payer: PPO | Admitting: Family Medicine

## 2014-10-08 ENCOUNTER — Encounter: Payer: Self-pay | Admitting: Family Medicine

## 2014-10-08 VITALS — BP 130/72 | HR 75 | Ht 69.0 in | Wt 176.0 lb

## 2014-10-08 DIAGNOSIS — M79642 Pain in left hand: Secondary | ICD-10-CM

## 2014-10-08 DIAGNOSIS — G5601 Carpal tunnel syndrome, right upper limb: Secondary | ICD-10-CM

## 2014-10-08 DIAGNOSIS — G5603 Carpal tunnel syndrome, bilateral upper limbs: Secondary | ICD-10-CM

## 2014-10-08 DIAGNOSIS — G5602 Carpal tunnel syndrome, left upper limb: Secondary | ICD-10-CM | POA: Diagnosis not present

## 2014-10-08 NOTE — Progress Notes (Signed)
Pre visit review using our clinic review tool, if applicable. No additional management support is needed unless otherwise documented below in the visit note. 

## 2014-10-08 NOTE — Patient Instructions (Signed)
Good to see you Ice 20 minutes 2 times daily. Usually after activity and before bed. Wear brace at night Exercises 3 times a week.  Call me in a week and if not better we will get you in with a hand guy.

## 2014-10-08 NOTE — Assessment & Plan Note (Signed)
Patient was given an injection today and started having some increasing feeling into the fingers immediately. Discuss with patients about the possibility of surgical intervention being necessary with patient having severe amount of swelling of the nerve. We will see if patient response to the injection. We discussed icing regimen. The patient does have worsening symptoms in one week we will refer to hand surgeon for further evaluation. Patient given home exercises as well as an icing protocol. Patient also given a brace to wear at night. Patient come back again in 3 weeks for further evaluation.

## 2014-10-08 NOTE — Progress Notes (Signed)
[facility] [place], [place] [address] Phone: [phone] Subjective:     CC: Left wrist pain and numbness  Isaiah Davidson is a 81 y.o. male coming in with new complaint of left wrist pain and numbness. Patient has had a history of crystal arthropathy previously. Patient is also had a history of right-sided carpal tunnel syndrome. Patient states that this feels very similar on the left side. Patient did have surgical intervention greater than 4 years ago. Patient states that unfortunately he is having numbness from his thumb finger to his ring finger on the left hand that seems to be much more continuous. Patient states from time to time he feels like he is dropping things. Patient states at night it is waking him up as. Patient describes it as a dull throbbing aching pain. Denies any swelling of the hand. No signs of fevers, chills, and denies any redness of the hand. No recent injuries.  .    Past medical history, social, surgical and family history all reviewed in electronic medical record.   Review of Systems: No headache, visual changes, nausea, vomiting, diarrhea, constipation, dizziness, abdominal pain, skin rash, fevers, chills, night sweats, weight loss, swollen lymph nodes, body aches, joint swelling, muscle aches, chest pain, shortness of breath, mood changes.   Objective There were no vitals taken for this visit.  General: No apparent distress alert and oriented x3 mood and affect normal, dressed appropriately.  HEENT: Pupils equal, extraocular movements intact  Respiratory: Patient's speak in full sentences and does not appear short of breath  Cardiovascular: No lower extremity edema, non tender, no erythema  Skin: Warm dry intact with no signs of infection or rash on extremities or on axial skeleton.  Abdomen: Soft nontender  Neuro: Cranial nerves II through XII are intact, neurovascularly intact in all extremities with  2+ DTRs and 2+ pulses.  Lymph: No lymphadenopathy of posterior or anterior cervical chain or axillae bilaterally.  Gait normal with good balance and coordination.  MSK:  Non tender with full range of motion and good stability and symmetric strength and tone of shoulders, elbows, wrist, hip, knee and ankles bilaterally. Moderate osteophytic changes of multiple joints. Moderate osteoarthritic changes of multiple joints.  Wrist: Left Inspection normal with no visible erythema or swelling. ROM smooth and normal with good flexion and extension and ulnar/radial deviation that is symmetrical with opposite wrist. Palpation is normal over metacarpals, navicular, lunate, and TFCC; tendons without tenderness/ swelling No snuffbox tenderness. No tenderness over Canal of Guyon. Strength 5/5 in all directions without pain. Negative Finkelstein, positive tinel's and phalens. Negative Watson's test.    MSK US performed of: Left wrist This study was ordered, performed, and interpreted by Charlann Boxer D.O.  Wrist: All extensor compartments visualized and tendons all normal in appearance without fraying, tears, or sheath effusions. No effusion seen. TFCC intact. Scapholunate ligament intact. Carpal tunnel visualized and median nerve significantly enlarged with 2.01 cm in area. Power doppler signal normal.  IMPRESSION:  Carpal tunnel syndrome  Procedure: Real-time Ultrasound Guided Injection of left carpal tunnel Device: GE Logiq E  Ultrasound guided injection is preferred based studies that show increased duration, increased effect, greater accuracy, decreased procedural pain, increased response rate with ultrasound guided versus blind injection.  Verbal informed consent obtained.  Time-out conducted.  Noted no overlying erythema, induration, or other signs of local infection.  Skin prepped in a sterile fashion.  Local anesthesia: Topical Ethyl chloride.  With sterile technique and under real time  ultrasound guidance:  median nerve visualized.  23g 5/8 inch needle inserted distal to proximal approach into nerve sheath. Pictures taken nfor needle placement. Patient did have injection of 2 cc of 1% lidocaine, 1 cc of 0.5% Marcaine, and 1 cc of Kenalog 40 mg/dL. Completed without difficulty  Pain immediately resolved suggesting accurate placement of the medication.  Advised to call if fevers/chills, erythema, induration, drainage, or persistent bleeding.  Images permanently stored and available for review in the ultrasound unit.  Impression: Technically successful ultrasound guided injection.         Impression and Recommendations:     This case required medical decision making of moderate complexity.

## 2014-10-13 ENCOUNTER — Telehealth: Payer: Self-pay | Admitting: Family Medicine

## 2014-10-13 DIAGNOSIS — G5603 Carpal tunnel syndrome, bilateral upper limbs: Secondary | ICD-10-CM

## 2014-10-13 NOTE — Telephone Encounter (Signed)
Patient states that his wrist are not feeling any better. He would like to know what the next step is.

## 2014-10-13 NOTE — Telephone Encounter (Signed)
Spoke to pt, we are entering a referral for the pt to see Dr. Fredna Dow.

## 2014-11-24 ENCOUNTER — Encounter (HOSPITAL_COMMUNITY): Payer: Self-pay

## 2014-11-24 ENCOUNTER — Emergency Department (INDEPENDENT_AMBULATORY_CARE_PROVIDER_SITE_OTHER)
Admission: EM | Admit: 2014-11-24 | Discharge: 2014-11-24 | Disposition: A | Discharge status: Home / Self Care | Payer: PPO | Source: Home / Self Care | Attending: Family Medicine | Admitting: Family Medicine

## 2014-11-24 ENCOUNTER — Emergency Department (INDEPENDENT_AMBULATORY_CARE_PROVIDER_SITE_OTHER): Payer: PPO

## 2014-11-24 DIAGNOSIS — R109 Unspecified abdominal pain: Secondary | ICD-10-CM

## 2014-11-24 LAB — POCT URINALYSIS DIP (DEVICE)
Bilirubin Urine: NEGATIVE
GLUCOSE, UA: NEGATIVE mg/dL
KETONES UR: NEGATIVE mg/dL
Leukocytes, UA: NEGATIVE
Nitrite: NEGATIVE
PH: 7.5 (ref 5.0–8.0)
PROTEIN: NEGATIVE mg/dL
Specific Gravity, Urine: 1.02 (ref 1.005–1.030)
Urobilinogen, UA: 0.2 mg/dL (ref 0.0–1.0)

## 2014-11-24 LAB — POCT I-STAT, CHEM 8
BUN: 15 mg/dL (ref 6–20)
CALCIUM ION: 1.26 mmol/L (ref 1.13–1.30)
Chloride: 95 mmol/L — ABNORMAL LOW (ref 101–111)
Creatinine, Ser: 1.1 mg/dL (ref 0.61–1.24)
Glucose, Bld: 83 mg/dL (ref 65–99)
HEMATOCRIT: 44 % (ref 39.0–52.0)
Hemoglobin: 15 g/dL (ref 13.0–17.0)
Potassium: 4.1 mmol/L (ref 3.5–5.1)
Sodium: 136 mmol/L (ref 135–145)
TCO2: 27 mmol/L (ref 0–100)

## 2014-11-24 LAB — OCCULT BLOOD, POC DEVICE: Fecal Occult Bld: NEGATIVE

## 2014-11-24 MED ORDER — ONDANSETRON HCL 4 MG PO TABS: 4.0000 mg | ORAL_TABLET | Freq: Three times a day (TID) | ORAL | Status: DC | PRN

## 2014-11-24 NOTE — ED Notes (Signed)
C/o transverse abdominal pain, feels as if he is constipated. Had a good BM today

## 2014-11-24 NOTE — ED Provider Notes (Signed)
CSN: [id number]     Arrival date & time 11/24/14  2836 History   First MD Initiated Contact with Patient 11/24/14 1108     Chief Complaint  Patient presents with  . Abdominal Pain  . Chills   (Consider location/radiation/quality/duration/timing/severity/associated sxs/prior Treatment) HPI Comments: PCP: [facility]: [facility] Endorses 4 days of black stools  Patient is a 81 y.o. male presenting with abdominal pain. The history is provided by the patient.  Abdominal Pain Pain location:  Generalized Pain quality: aching   Pain radiates to:  Does not radiate Pain severity:  Mild Onset quality:  Gradual Duration:  4 days Timing:  Constant Progression:  Unchanged Chronicity:  New Relieved by:  None tried Worsened by:  Nothing tried Ineffective treatments:  None tried Associated symptoms: constipation, melena and nausea   Associated symptoms: no anorexia, no belching, no chest pain, no chills, no cough, no diarrhea, no dysuria, no fatigue, no fever, no flatus, no hematemesis, no hematochezia, no hematuria, no shortness of breath, no sore throat and no vomiting   Risk factors: aspirin and being elderly   Risk factors: no alcohol abuse, has not had multiple surgeries, no NSAID use, not obese and no recent hospitalization     Past Medical History  Diagnosis Date  . HYPERLIPIDEMIA 02/22/2007  . HYPERTENSION 02/22/2007  . Seizures     treated for siezures around 2010  . MCI (mild cognitive impairment) with memory loss 05/14/2014   Past Surgical History  Procedure Laterality Date  . Knee surgery      arthroscopic  . Carpal tunnel release     Family History  Problem Relation Age of Onset  . Diabetes Paternal Grandmother     raised by grandparents   History  Substance Use Topics  . Smoking status: Former Smoker -- 1.00 packs/day for 30 years    Types: Cigars, Cigarettes    Quit date: 07/11/1978  . Smokeless tobacco: Never Used  . Alcohol Use: No    Review of Systems    Constitutional: Negative for fever, chills and fatigue.  HENT: Negative for sore throat.   Eyes: Negative.   Respiratory: Negative for cough, chest tightness and shortness of breath.   Cardiovascular: Negative for chest pain, palpitations and leg swelling.  Gastrointestinal: Positive for nausea, abdominal pain, constipation and melena. Negative for vomiting, diarrhea, hematochezia, anal bleeding, anorexia, flatus and hematemesis.  Genitourinary: Negative for dysuria, urgency, frequency, hematuria, flank pain and difficulty urinating.  Musculoskeletal: Negative for back pain.  Skin: Negative.   Neurological: Negative for dizziness and light-headedness.    Allergies  Review of patient's allergies indicates no known allergies.  Home Medications   Prior to Admission medications   Medication Sig Start Date End Date Taking? Authorizing Provider  amLODipine (NORVASC) 10 MG tablet Take 1 tablet (10 mg total) by mouth daily. 08/08/14   Marin Olp, MD  aspirin 81 MG tablet Take 81 mg by mouth daily.      Historical Provider, MD  chlorthalidone (HYGROTON) 25 MG tablet Take 1 tablet (25 mg total) by mouth daily. 07/21/14   Marin Olp, MD  Docusate Calcium (STOOL SOFTENER PO) Take by mouth as needed.    Historical Provider, MD  donepezil (ARICEPT) 5 MG tablet Take 1 tablet (5 mg total) by mouth 2 (two) times daily. 08/18/14   Asencion Partridge Dohmeier, MD  Febuxostat 80 MG TABS Take 1 tablet (80 mg total) by mouth daily. 07/25/14   Lyndal Pulley, DO  levETIRAcetam ([place])  500 MG tablet Take 1 tablet (500 mg total) by mouth 2 (two) times daily. 08/18/14   Asencion Partridge Dohmeier, MD  Multiple Vitamin (MULTIVITAMIN) capsule Take 1 capsule by mouth daily.      Historical Provider, MD  ondansetron (ZOFRAN) 4 MG tablet Take 1 tablet (4 mg total) by mouth every 8 (eight) hours as needed for nausea or vomiting. 11/24/14   Lutricia Feil, PA  simvastatin (ZOCOR) 40 MG tablet TAKE ONE TABLET BY MOUTH ONCE DAILY  08/08/14   Marin Olp, MD   BP 147/68 mmHg  Pulse 70  Temp(Src) 97.9 F (36.6 C) (Oral)  Resp 16  SpO2 96% Physical Exam  Constitutional: He is oriented to person, place, and time. He appears well-developed and well-nourished. No distress.  HENT:  Head: Normocephalic and atraumatic.  Eyes: Conjunctivae are normal.  Cardiovascular: Normal rate, regular rhythm and normal heart sounds.   Pulmonary/Chest: Effort normal and breath sounds normal. No respiratory distress. He has no wheezes.  Abdominal: Normal appearance and bowel sounds are normal. He exhibits no distension. There is tenderness. There is no rigidity, no rebound, no guarding and no CVA tenderness.    Outlined area is region of mild discomfort  Genitourinary: Rectal exam shows no external hemorrhoid, no internal hemorrhoid, no fissure, no mass, no tenderness and anal tone normal. Guaiac negative stool.  Musculoskeletal: Normal range of motion.  Neurological: He is alert and oriented to person, place, and time.  Skin: Skin is warm and dry. No pallor.  Psychiatric: He has a normal mood and affect. His behavior is normal.  Nursing note and vitals reviewed.   ED Course  ED EKG  Date/Time: 11/24/2014 12:24 PM Performed by: Griselda Miner LEE H Authorized by: Lutricia Feil Interpreted by ED physician Comparison: compared with previous ECG from 10/22/2012 Similar to previous ECG Comparison to previous ECG: No acute changes Rhythm: sinus rhythm Rate: normal BPM: 61 QRS axis: normal Conduction: conduction normal ST Segments: ST segments normal T Waves: T waves normal Clinical impression: normal ECG   (including critical care time) Labs Review Labs Reviewed  POCT URINALYSIS DIP (DEVICE) - Abnormal; Notable for the following:    Hgb urine dipstick TRACE (*)    All other components within normal limits  POCT I-STAT, CHEM 8 - Abnormal; Notable for the following:    Chloride 95 (*)    All other  components within normal limits  OCCULT BLOOD, POC DEVICE    Imaging Review Dg Abd 2 Views  11/24/2014   CLINICAL DATA:  Generalized abdominal pain  EXAM: ABDOMEN - 2 VIEW  COMPARISON:  CT abdomen 10/07/2009  FINDINGS: There is no bowel dilatation to suggest obstruction. There are no air-fluid levels. There is no evidence of pneumoperitoneum, portal venous gas, or pneumatosis. There are no pathologic calcifications along the expected course of the ureters.  There is generalized osteopenia. There is calcific tendinosis at the gluteus insertion bilaterally.  There are degenerative changes of the lower lumbar spine and SI joints.  IMPRESSION: No bowel obstruction.   Electronically Signed   By: Kathreen Devoid   On: 11/24/2014 12:51     MDM   1. Abdominal pain   Case discussed with Dr. Gaspar Cola 8 grossly normal ECG normal UA grossly normal and consistent with chronic mild hematuria Vital signs and exam are reassuring Hemoccult negative Abd films negative for acute pathology. Advised to stick to a clear liquid diet for the next 8-12 hours and if  symptoms escalate, he understands to seek re-evaluation at his nearest ER. He also voices understanding that if symptoms persist, he is to either follow up with his PCP or gastroenterologist.      Lutricia Feil, PA 11/24/14 [phone]

## 2014-11-24 NOTE — Discharge Instructions (Signed)
The studies of your blood, urine and stool were all normal. You xrays and EKG were also normal. I would advise sticking to a clear liquid diet for the next 8-12 hours and using tylenol for pain and zofran as prescribed for nausea. Monitor your symptoms closely at home. If symptoms change or become worse, please seek immediate re-evaluation at your nearest emergency room. If symptoms persist over the next 1-2 days, please either follow up with Dr. Yong Channel or your gastroenterologist at [facility].  If pain becomes suddenly worse or severe, do not hesitate to seek immediate re-evaluation at your nearest emergency room.  Abdominal Pain Many things can cause abdominal pain. Usually, abdominal pain is not caused by a disease and will improve without treatment. It can often be observed and treated at home. Your health care provider will do a physical exam and possibly order blood tests and X-rays to help determine the seriousness of your pain. However, in many cases, more time must pass before a clear cause of the pain can be found. Before that point, your health care provider may not know if you need more testing or further treatment. HOME CARE INSTRUCTIONS  Monitor your abdominal pain for any changes. The following actions may help to alleviate any discomfort you are experiencing:  Only take over-the-counter or prescription medicines as directed by your health care provider.  Do not take laxatives unless directed to do so by your health care provider.  Try a clear liquid diet (broth, tea, or water) as directed by your health care provider. Slowly move to a bland diet as tolerated. SEEK MEDICAL CARE IF:  You have unexplained abdominal pain.  You have abdominal pain associated with nausea or diarrhea.  You have pain when you urinate or have a bowel movement.  You experience abdominal pain that wakes you in the night.  You have abdominal pain that is worsened or improved by eating  food.  You have abdominal pain that is worsened with eating fatty foods.  You have a fever. SEEK IMMEDIATE MEDICAL CARE IF:   Your pain does not go away within 2 hours.  You keep throwing up (vomiting).  Your pain is felt only in portions of the abdomen, such as the right side or the left lower portion of the abdomen.  You pass bloody or black tarry stools. MAKE SURE YOU:  Understand these instructions.   Will watch your condition.   Will get help right away if you are not doing well or get worse.  Document Released: 04/06/2005 Document Revised: 07/02/2013 Document Reviewed: 03/06/2013 [facility] Patient Information 2015 [place], [place]. This information is not intended to replace advice given to you by your health care provider. Make sure you discuss any questions you have with your health care provider.

## 2015-01-19 ENCOUNTER — Other Ambulatory Visit (INDEPENDENT_AMBULATORY_CARE_PROVIDER_SITE_OTHER): Payer: PPO

## 2015-01-19 ENCOUNTER — Telehealth: Payer: Self-pay

## 2015-01-19 DIAGNOSIS — E785 Hyperlipidemia, unspecified: Secondary | ICD-10-CM

## 2015-01-19 LAB — LDL CHOLESTEROL, DIRECT: Direct LDL: 99 mg/dL

## 2015-01-19 NOTE — Telephone Encounter (Signed)
Future lab entered.  

## 2015-01-21 ENCOUNTER — Ambulatory Visit (INDEPENDENT_AMBULATORY_CARE_PROVIDER_SITE_OTHER): Payer: PPO | Admitting: Family Medicine

## 2015-01-21 ENCOUNTER — Encounter: Payer: Self-pay | Admitting: Family Medicine

## 2015-01-21 VITALS — BP 134/71 | HR 76 | Temp 99.9°F | Ht 69.0 in | Wt 164.0 lb

## 2015-01-21 DIAGNOSIS — R1013 Epigastric pain: Secondary | ICD-10-CM | POA: Diagnosis not present

## 2015-01-21 LAB — BASIC METABOLIC PANEL
BUN: 22 mg/dL (ref 6–23)
CALCIUM: 10.5 mg/dL (ref 8.4–10.5)
CHLORIDE: 92 meq/L — AB (ref 96–112)
CO2: 32 mEq/L (ref 19–32)
CREATININE: 1.21 mg/dL (ref 0.40–1.50)
GFR: 74 mL/min (ref >60.00)
Glucose, Bld: 86 mg/dL (ref 70–99)
POTASSIUM: 3.2 meq/L — AB (ref 3.5–5.1)
Sodium: 135 mEq/L (ref 135–145)

## 2015-01-21 LAB — CBC WITH DIFFERENTIAL/PLATELET
BASOS ABS: 0 10*3/uL (ref 0.0–0.1)
BASOS PCT: 0.2 % (ref 0.0–3.0)
EOS ABS: 0 10*3/uL (ref 0.0–0.7)
Eosinophils Relative: 0.3 % (ref 0.0–5.0)
HCT: 41.2 % (ref 39.0–52.0)
HEMOGLOBIN: 13.7 g/dL (ref 13.0–17.0)
LYMPHS ABS: 1.4 10*3/uL (ref 0.7–4.0)
Lymphocytes Relative: 13.8 % (ref 12.0–46.0)
MCHC: 33.3 g/dL (ref 30.0–36.0)
MCV: 88.2 fl (ref 78.0–100.0)
MONOS PCT: 8.5 % (ref 3.0–12.0)
Monocytes Absolute: 0.9 10*3/uL (ref 0.1–1.0)
NEUTROS ABS: 7.8 10*3/uL — AB (ref 1.4–7.7)
Neutrophils Relative %: 77.2 % — ABNORMAL HIGH (ref 43.0–77.0)
PLATELETS: 359 10*3/uL (ref 150.0–400.0)
RBC: 4.67 Mil/uL (ref 4.22–5.81)
RDW: 13.2 % (ref 11.5–15.5)
WBC: 10.1 10*3/uL (ref 4.0–10.5)

## 2015-01-21 LAB — HEPATIC FUNCTION PANEL
ALT: 27 U/L (ref 0–53)
AST: 29 U/L (ref 0–37)
Albumin: 4.2 g/dL (ref 3.5–5.2)
Alkaline Phosphatase: 84 U/L (ref 39–117)
BILIRUBIN DIRECT: 0.1 mg/dL (ref 0.0–0.3)
BILIRUBIN TOTAL: 0.5 mg/dL (ref 0.2–1.2)
TOTAL PROTEIN: 8.5 g/dL — AB (ref 6.0–8.3)

## 2015-01-21 LAB — LIPASE: Lipase: 63 U/L — ABNORMAL HIGH (ref 11.0–59.0)

## 2015-01-21 LAB — AMYLASE: Amylase: 79 U/L (ref 27–131)

## 2015-01-21 MED ORDER — OMEPRAZOLE 40 MG PO CPDR: 40.0000 mg | DELAYED_RELEASE_CAPSULE | Freq: Every day | ORAL | Status: DC

## 2015-01-21 NOTE — Progress Notes (Signed)
   Subjective:    Patient ID: Isaiah Davidson, male    DOB: 12-03-1931, 81 y.o.   MRN: [id number]  HPI Here for 2 months of intermittent pains in the epigastrium and around the umbilicus. He went to Urgent Care for this in May and had a few tests, but nothing showed up. When this came back 2 days ago he also had nausea and vomiting with it, but none of this so far today. No fever. His BMs are regular. His colonoscopy in 2013 showed some diverticulae only.    Review of Systems  Constitutional: Negative.   Respiratory: Negative.   Cardiovascular: Negative.   Gastrointestinal: Positive for nausea, vomiting and abdominal pain. Negative for diarrhea, constipation, blood in stool, abdominal distention, anal bleeding and rectal pain.  Genitourinary: Negative.        Objective:   Physical Exam  Constitutional: He appears well-developed and well-nourished. No distress.  Neck: No thyromegaly present.  Cardiovascular: Normal rate, regular rhythm, normal heart sounds and intact distal pulses.   Pulmonary/Chest: Effort normal and breath sounds normal.  Abdominal: Soft. Bowel sounds are normal. He exhibits no distension and no mass. There is no rebound and no guarding.  Mildly tender in the epigastrium  Lymphadenopathy:    He has no cervical adenopathy.          Assessment & Plan:  This sounds like duodenitis or duodenal ulcers, but we need to rule out gall bladder disease. Start on Omeprazole 40 mg daily. Get labs and an abdominal US soon.

## 2015-01-21 NOTE — Progress Notes (Signed)
Pre visit review using our clinic review tool, if applicable. No additional management support is needed unless otherwise documented below in the visit note. 

## 2015-01-26 ENCOUNTER — Ambulatory Visit: Payer: PPO | Admitting: Family Medicine

## 2015-01-28 ENCOUNTER — Ambulatory Visit
Admission: RE | Admit: 2015-01-28 | Discharge: 2015-01-28 | Disposition: A | Discharge status: Home / Self Care | Payer: PPO | Source: Ambulatory Visit | Attending: Family Medicine | Admitting: Family Medicine

## 2015-01-28 DIAGNOSIS — R1013 Epigastric pain: Secondary | ICD-10-CM

## 2015-02-02 ENCOUNTER — Ambulatory Visit (INDEPENDENT_AMBULATORY_CARE_PROVIDER_SITE_OTHER): Payer: PPO | Admitting: Family Medicine

## 2015-02-02 ENCOUNTER — Encounter: Payer: Self-pay | Admitting: Family Medicine

## 2015-02-02 VITALS — BP 130/82 | HR 72 | Temp 98.5°F | Wt 168.0 lb

## 2015-02-02 DIAGNOSIS — K269 Duodenal ulcer, unspecified as acute or chronic, without hemorrhage or perforation: Secondary | ICD-10-CM

## 2015-02-02 DIAGNOSIS — E876 Hypokalemia: Secondary | ICD-10-CM | POA: Diagnosis not present

## 2015-02-02 DIAGNOSIS — I10 Essential (primary) hypertension: Secondary | ICD-10-CM | POA: Diagnosis not present

## 2015-02-02 DIAGNOSIS — E785 Hyperlipidemia, unspecified: Secondary | ICD-10-CM

## 2015-02-02 DIAGNOSIS — G3184 Mild cognitive impairment, so stated: Secondary | ICD-10-CM

## 2015-02-02 LAB — BASIC METABOLIC PANEL
BUN: 16 mg/dL (ref 6–23)
CO2: 31 mEq/L (ref 19–32)
CREATININE: 1.17 mg/dL (ref 0.40–1.50)
Calcium: 9.6 mg/dL (ref 8.4–10.5)
Chloride: 98 mEq/L (ref 96–112)
GFR: 76.92 mL/min (ref >60.00)
Glucose, Bld: 65 mg/dL — ABNORMAL LOW (ref 70–99)
Potassium: 3.3 mEq/L — ABNORMAL LOW (ref 3.5–5.1)
Sodium: 138 mEq/L (ref 135–145)

## 2015-02-02 NOTE — Assessment & Plan Note (Signed)
S: controlled on Chlorthalidone, amlodipine A/P: continue current rx

## 2015-02-02 NOTE — Assessment & Plan Note (Signed)
S:Neurology follows. Aricept 5mg -did not tolerate 10mg . Patient denies worsening memory issues. He is here by himself today and provides history matching notes from urgent care and Dr. Sarajane Jews A/P: continue current therapy.

## 2015-02-02 NOTE — Patient Instructions (Addendum)
Make sure to stop by the lab before you leave today. We are checking in on your potassium to make sure it has come back up.   Blood pressure looks great as does cholesterol.  Let's plan on seeing you back in 6 months.   You may stop your omeprazole/prilosec after you finish the original bottle and 1 refill (2 months worth). If your symptoms return when you stop, come see [place] but usually 2 months worth is enough to heal inflammation in the upper abdomen up

## 2015-02-02 NOTE — Progress Notes (Signed)
Isaiah Reddish, MD  Subjective:  Isaiah Davidson is a 81 y.o. year old very pleasant male patient who presents with:  Duodenal ulcer/abdominal pain Seen 12 days ago by Dr. Sarajane Jews for abdominal pain, thought to be duodenal ulcer- started on omeprazole. Did have low potassium on labs likely due to some vomiting he had. Previous urgent care workup had been unrevealing. He had resolution of symptoms within 2-3 days and continues to do well. Still taking PPI>  ROS- no longer with any abdominal pain, nausea, vomiting,  Also See problem oriented charting ROS-Denies any CP, HA, SOB, blurry vision, LE edema  Past Medical History- mild cognitive impairment followed by neurology, seizure disorder followe dby neuro  Medications- reviewed and updated Current Outpatient Prescriptions  Medication Sig Dispense Refill  . amLODipine (NORVASC) 10 MG tablet Take 1 tablet (10 mg total) by mouth daily. 90 tablet 1  . aspirin 81 MG tablet Take 81 mg by mouth daily.      . chlorthalidone (HYGROTON) 25 MG tablet Take 1 tablet (25 mg total) by mouth daily. 90 tablet 3  . Docusate Calcium (STOOL SOFTENER PO) Take by mouth as needed.    . donepezil (ARICEPT) 5 MG tablet Take 1 tablet (5 mg total) by mouth 2 (two) times daily. 180 tablet 1  . Febuxostat 80 MG TABS Take 1 tablet (80 mg total) by mouth daily. 30 tablet 3  . levETIRAcetam (KEPPRA) 500 MG tablet Take 1 tablet (500 mg total) by mouth 2 (two) times daily. 180 tablet 1  . Multiple Vitamin (MULTIVITAMIN) capsule Take 1 capsule by mouth daily.      [name] Kitchen omeprazole (PRILOSEC) 40 MG capsule Take 1 capsule (40 mg total) by mouth daily. 30 capsule 3  . simvastatin (ZOCOR) 40 MG tablet TAKE ONE TABLET BY MOUTH ONCE DAILY 90 tablet 1  . ondansetron (ZOFRAN) 4 MG tablet Take 1 tablet (4 mg total) by mouth every 8 (eight) hours as needed for nausea or vomiting. (Patient not taking: Reported on 01/21/2015) 12 tablet 0   Objective: BP 130/82 mmHg  Pulse 72  Temp(Src) 98.5  F (36.9 C)  Wt 168 lb (76.204 kg) Gen: NAD, resting comfortably CV: RRR no murmurs rubs or gallops Lungs: CTAB no crackles, wheeze, rhonchi Abdomen: soft/nontender/nondistended/normal bowel sounds. No rebound or guarding.  Ext: no edema Skin: warm, dry, no rash Neuro: grossly normal, moves all extremities   Assessment/Plan:  Duodenal ulcer/abdominal pain Good improvement/resolution with PPI. Continue x 2 months then stop PPI. Patient to return for recurrence. Check BMET for hypokalemia  Essential hypertension S: controlled on Chlorthalidone, amlodipine A/P: continue current rx  Hyperlipidemia S: controlled on simvastatin 40mg  Lab Results  Component Value Date   CHOL 201* 09/24/2013   HDL 51.90 09/24/2013   LDLCALC 93 09/24/2013   LDLDIRECT 99.0 01/19/2015   TRIG 281.0* 09/24/2013   CHOLHDL 4 09/24/2013  A/P: continue current rx with LDL <100  MCI (mild cognitive impairment) with memory loss S:Neurology follows. Aricept 5mg -did not tolerate 10mg . Patient denies worsening memory issues. He is here by himself today and provides history matching notes from urgent care and Dr. Sarajane Jews A/P: continue current therapy.     Return precautions advised.   Orders Placed This Encounter  Procedures  . Basic Metabolic Panel    No orders of the defined types were placed in this encounter.

## 2015-02-02 NOTE — Assessment & Plan Note (Signed)
S: controlled on simvastatin 40mg  Lab Results  Component Value Date   CHOL 201* 09/24/2013   HDL 51.90 09/24/2013   LDLCALC 93 09/24/2013   LDLDIRECT 99.0 01/19/2015   TRIG 281.0* 09/24/2013   CHOLHDL 4 09/24/2013  A/P: continue current rx with LDL <100

## 2015-03-17 ENCOUNTER — Other Ambulatory Visit: Payer: Self-pay | Admitting: Neurology

## 2015-03-19 ENCOUNTER — Other Ambulatory Visit: Payer: Self-pay | Admitting: Family Medicine

## 2015-03-19 ENCOUNTER — Other Ambulatory Visit: Payer: Self-pay | Admitting: Neurology

## 2015-05-14 ENCOUNTER — Ambulatory Visit (INDEPENDENT_AMBULATORY_CARE_PROVIDER_SITE_OTHER): Payer: PPO | Admitting: Adult Health

## 2015-05-14 ENCOUNTER — Encounter: Payer: Self-pay | Admitting: Adult Health

## 2015-05-14 VITALS — BP 142/78 | HR 76 | Ht 69.0 in | Wt 171.5 lb

## 2015-05-14 DIAGNOSIS — R569 Unspecified convulsions: Secondary | ICD-10-CM

## 2015-05-14 DIAGNOSIS — R413 Other amnesia: Secondary | ICD-10-CM

## 2015-05-14 NOTE — Progress Notes (Signed)
I agree with the assessment and plan as directed by NP .The patient is known to me .   Loraina Stauffer, MD  

## 2015-05-14 NOTE — Progress Notes (Signed)
PATIENT: Isaiah Davidson DOB: 01/13/1932  REASON FOR VISIT: follow up- memory, seizures HISTORY FROM: patient  HISTORY OF PRESENT ILLNESS: Isaiah Davidson is an 81 year old male with a history of seizures and memory loss. He returns today for follow-up. He is currently taking Keppra and Aricept. He reports that he has not had any seizure events. He is able to complete all ADLs independently. He operates a Teacher, music without difficulty. The patient feels that he does not have any issues with his memory. His wife is with him today at the visit and she's not noticed any significant changes. He states that he sleeps well at night. Denies any agitation or aggressiveness. Denies any new medical issues. Patient states that since he is retired he has no need to remember the date so therefore that why he does not score good on her memory test. He returns today for follow-up.   HISTORY 05/14/14 ([place]): Isaiah Davidson, A married, right handed 47 -year-old african [place] male, presents for followup for memory loss and seizure control. [name] Kitchen He was last seen by Cecille Rubin 05-13-13. A head CT in past showed established white matter disease. He has COPD, and high cholersterol.  No seizure activity in over 4 years. Hx of Carpal Tunnel Surgery. He has tolerated Aricept 58m bid , after presenting with GI side effects to 134mdose.  [facility] 24-30 points .  No increased confusion or behavior issues according to wife.  Remains independent in ADL's. Minimal exercise but does "keep up the yard", his appetite is reportedly good and he is sleeping well at night. His wife has always balanced the cheques and he has always done the maintenance on cars, garden and Appliances. He still does a "little plumming".    HISTORY: 05-18-12 Dr. DoBrett Fairyv for this established mild cognitive impairmed patient, who scores today 23-30 on MOCA and did score 29/30 on MMSE 6 month ago. he has  had a single prolonged seizure 2011, no recurrence on Keppra. EEG reviewed. patient doing well on 5 mg daily aricept, but should try 5 mg bid po.   REVIEW OF SYSTEMS: Out of a complete 14 system review of symptoms, the patient complains only of the following symptoms, and all other reviewed systems are negative.  See history of present illness  ALLERGIES: No Known Allergies  HOME MEDICATIONS: Outpatient Prescriptions Prior to Visit  Medication Sig Dispense Refill  . amLODipine (NORVASC) 10 MG tablet Take 1 tablet by mouth daily 90 tablet 0  . aspirin 81 MG tablet Take 81 mg by mouth daily.      . chlorthalidone (HYGROTON) 25 MG tablet Take 1 tablet (25 mg total) by mouth daily. 90 tablet 3  . Docusate Calcium (STOOL SOFTENER PO) Take by mouth as needed.    . donepezil (ARICEPT) 5 MG tablet Take 1 tablet by mouth twice a day 180 tablet 0  . Febuxostat 80 MG TABS Take 1 tablet (80 mg total) by mouth daily. 30 tablet 3  . levETIRAcetam (KEPPRA) 500 MG tablet TAKE ONE TABLET BY MOUTH TWICE DAILY 180 tablet 0  . Multiple Vitamin (MULTIVITAMIN) capsule Take 1 capsule by mouth daily.      . [name] Kitchenmeprazole (PRILOSEC) 40 MG capsule Take 1 capsule (40 mg total) by mouth daily. 30 capsule 3  . simvastatin (ZOCOR) 40 MG tablet Take 1 tablet by mouth once daily 90 tablet 0   No facility-administered medications prior to visit.    PAST MEDICAL HISTORY:  Past Medical History  Diagnosis Date  . HYPERLIPIDEMIA 02/22/2007  . HYPERTENSION 02/22/2007  . Seizures ([place])     treated for siezures around 2010  . MCI (mild cognitive impairment) with memory loss 05/14/2014    PAST SURGICAL HISTORY: Past Surgical History  Procedure Laterality Date  . Knee surgery      arthroscopic  . Carpal tunnel release      FAMILY HISTORY: Family History  Problem Relation Age of Onset  . Diabetes Paternal Grandmother     raised by grandparents    SOCIAL HISTORY: Social History   Social History  . Marital  Status: Married    Spouse Name: [name]  . Number of Children: 2  . Years of Education: 10   Occupational History  .     Social History Main Topics  . Smoking status: Former Smoker -- 1.00 packs/day for 30 years    Types: Cigars, Cigarettes    Quit date: 07/11/1978  . Smokeless tobacco: Never Used  . Alcohol Use: No  . Drug Use: No  . Sexual Activity: Not on file   Other Topics Concern  . Not on file   Social History Narrative   Patient is married ([name]).   Patient has 3 children. 2 grandkids. 3 greatgrandkids.    Patient is retired Administrator.    Patient is right-handed.   Patient has a 10th grade education.   Patient drinks one cup of coffee daily.      Hobbies: working in the yard- gardening competition with neighbor on who can grow best vegetables, "messing around the house"      07/21/14- wants wife to make decisions for him. Not sure if he has advanced directives.             PHYSICAL EXAM  Filed Vitals:   05/14/15 1039  BP: 142/78  Pulse: 76  Height: '5\' 9"'  (1.753 m)  Weight: 171 lb 8 oz (77.792 kg)   Body mass index is 25.31 kg/(m^2).  MMSE - Mini Mental State Exam 05/14/2015  Orientation to time 1  Orientation to Place 4  Registration 3  Attention/ Calculation 5  Recall 2  Language- name 2 objects 2  Language- repeat 1  Language- follow 3 step command 3  Language- read & follow direction 1  Write a sentence 1  Copy design 0  Total score 23   Montreal Cognitive Assessment  05/14/2015 05/14/2014  Visuospatial/ Executive (0/5) 3 4  Naming (0/3) 2 3  Attention: Read list of digits (0/2) 2 1  Attention: Read list of letters (0/1) 1 1  Attention: Serial 7 subtraction starting at 100 (0/3) 1 2  Language: Repeat phrase (0/2) 1 2  Language : Fluency (0/1) 1 0  Abstraction (0/2) 1 2  Delayed Recall (0/5) 0 2  Orientation (0/6) 5 6  Total 17 23  Adjusted Score (based on education) 18 23    Generalized: Well developed, in no acute distress    Neurological examination  Mentation: Alert. Follows all commands speech and language fluent Cranial nerve II-XII: Pupils were equal round reactive to light. Extraocular movements were full, visual field were full on confrontational test. Facial sensation and strength were normal. Uvula tongue midline. Head turning and shoulder shrug  were normal and symmetric. Motor: The motor testing reveals 5 over 5 strength of all 4 extremities. Good symmetric motor tone is noted throughout.  Sensory: Sensory testing is intact to soft touch on all 4 extremities. No evidence of extinction  is noted.  Coordination: Cerebellar testing reveals good finger-nose-finger and heel-to-shin bilaterally.  Gait and station: Gait is normal. Tandem gait is normal. Romberg is negative. No drift is seen.  Reflexes: Deep tendon reflexes are symmetric and normal bilaterally.   DIAGNOSTIC DATA (LABS, IMAGING, TESTING) - I reviewed patient records, labs, notes, testing and imaging myself where available.  Lab Results  Component Value Date   WBC 10.1 01/21/2015   HGB 13.7 01/21/2015   HCT 41.2 01/21/2015   MCV 88.2 01/21/2015   PLT 359.0 01/21/2015      Component Value Date/Time   NA 138 02/02/2015 1037   K 3.3* 02/02/2015 1037   CL 98 02/02/2015 1037   CO2 31 02/02/2015 1037   GLUCOSE 65* 02/02/2015 1037   BUN 16 02/02/2015 1037   CREATININE 1.17 02/02/2015 1037   CALCIUM 9.6 02/02/2015 1037   PROT 8.5* 01/21/2015 1228   ALBUMIN 4.2 01/21/2015 1228   AST 29 01/21/2015 1228   ALT 27 01/21/2015 1228   ALKPHOS 84 01/21/2015 1228   BILITOT 0.5 01/21/2015 1228   GFRNONAA 77.16 01/28/2010 0809   GFRAA  12/11/2009 0615    >60        The eGFR has been calculated using the MDRD equation. This calculation has not been validated in all clinical situations. eGFR's persistently <60 mL/min signify possible Chronic Kidney Disease.   Lab Results  Component Value Date   CHOL 201* 09/24/2013   HDL 51.90 09/24/2013    LDLCALC 93 09/24/2013   LDLDIRECT 99.0 01/19/2015   TRIG 281.0* 09/24/2013   CHOLHDL 4 09/24/2013   Lab Results  Component Value Date   HGBA1C  02/23/2009    5.6 (NOTE) The ADA recommends the following therapeutic goal for glycemic control related to Hgb A1c measurement: Goal of therapy: <6.5 Hgb A1c  Reference: American Diabetes Association: Clinical Practice Recommendations 2010, Diabetes Care, 2010, 33: (Suppl  1).    ASSESSMENT AND PLAN 81 y.o. year old male  has a past medical history of HYPERLIPIDEMIA (02/22/2007); HYPERTENSION (02/22/2007); Seizures ([place]); and MCI (mild cognitive impairment) with memory loss (05/14/2014). here with:  1. Memory  2. Seizures  The patient will continue on Keppra and Aricept. The patient's memory score has slightly declined since the last visit. Although the patient does not feel that his memory has changed. I have recommended Namenda but they would like to think about this. Patient and his wife state they will call us if they decide to start this medication. Patient advised that if his symptoms worsen or he develops any new symptoms he should let us know. He will follow-up in 6 months or sooner if needed.  Ward Givens, MSN, NP-C 05/14/2015, 11:12 AM [facility] [address], [place], [place] [address] [phone]

## 2015-05-14 NOTE — Patient Instructions (Signed)
Continue Keppra and Aricept Consider Namenda for memory If your symptoms worsen or you develop new symptoms please let us know.   Memantine Tablets What is this medicine? MEMANTINE (MEM an teen) is used to treat dementia caused by Alzheimer's disease. This medicine may be used for other purposes; ask your health care provider or pharmacist if you have questions. What should I tell my health care provider before I take this medicine? They need to know if you have any of these conditions: -difficulty passing urine -kidney disease -liver disease -seizures -an unusual or allergic reaction to memantine, other medicines, foods, dyes, or preservatives -pregnant or trying to get pregnant -breast-feeding How should I use this medicine? Take this medicine by mouth with a glass of water. Follow the directions on the prescription label. You may take this medicine with or without food. Take your doses at regular intervals. Do not take your medicine more often than directed. Continue to take your medicine even if you feel better. Do not stop taking except on the advice of your doctor or health care professional. Talk to your pediatrician regarding the use of this medicine in children. Special care may be needed. Overdosage: If you think you have taken too much of this medicine contact a poison control center or emergency room at once. NOTE: This medicine is only for you. Do not share this medicine with others. What if I miss a dose? If you miss a dose, take it as soon as you can. If it is almost time for your next dose, take only that dose. Do not take double or extra doses. If you do not take your medicine for several days, contact your health care provider. Your dose may need to be changed. What may interact with this medicine? -acetazolamide -amantadine -cimetidine -dextromethorphan -dofetilide -hydrochlorothiazide -ketamine -metformin -methazolamide -quinidine -ranitidine -sodium  bicarbonate -triamterene This list may not describe all possible interactions. Give your health care provider a list of all the medicines, herbs, non-prescription drugs, or dietary supplements you use. Also tell them if you smoke, drink alcohol, or use illegal drugs. Some items may interact with your medicine. What should I watch for while using this medicine? Visit your doctor or health care professional for regular checks on your progress. Check with your doctor or health care professional if there is no improvement in your symptoms or if they get worse. You may get drowsy or dizzy. Do not drive, use machinery, or do anything that needs mental alertness until you know how this drug affects you. Do not stand or sit up quickly, especially if you are an older patient. This reduces the risk of dizzy or fainting spells. Alcohol can make you more drowsy and dizzy. Avoid alcoholic drinks. What side effects may I notice from receiving this medicine? Side effects that you should report to your doctor or health care professional as soon as possible: -allergic reactions like skin rash, itching or hives, swelling of the face, lips, or tongue -agitation or a feeling of restlessness -depressed mood -dizziness -hallucinations -redness, blistering, peeling or loosening of the skin, including inside the mouth -seizures -vomiting Side effects that usually do not require medical attention (report to your doctor or health care professional if they continue or are bothersome): -constipation -diarrhea -headache -nausea -trouble sleeping This list may not describe all possible side effects. Call your doctor for medical advice about side effects. You may report side effects to FDA at 1-800-FDA-1088. Where should I keep my medicine? Keep out of the  reach of children. Store at room temperature between 15 degrees and 30 degrees C (59 degrees and 86 degrees F). Throw away any unused medicine after the expiration  date. NOTE: This sheet is a summary. It may not cover all possible information. If you have questions about this medicine, talk to your doctor, pharmacist, or health care provider.    2016, Elsevier/Gold Standard. (2013-04-15 14:10:42)

## 2015-05-15 ENCOUNTER — Ambulatory Visit: Payer: Medicare PPO | Admitting: Adult Health

## 2015-05-25 ENCOUNTER — Other Ambulatory Visit: Payer: Self-pay

## 2015-05-25 MED ORDER — AMLODIPINE BESYLATE 10 MG PO TABS: 10.0000 mg | ORAL_TABLET | Freq: Every day | ORAL | Status: DC

## 2015-05-26 ENCOUNTER — Ambulatory Visit (INDEPENDENT_AMBULATORY_CARE_PROVIDER_SITE_OTHER): Payer: PPO

## 2015-05-26 DIAGNOSIS — Z23 Encounter for immunization: Secondary | ICD-10-CM

## 2015-06-15 ENCOUNTER — Other Ambulatory Visit: Payer: Self-pay | Admitting: Neurology

## 2015-06-15 ENCOUNTER — Other Ambulatory Visit: Payer: Self-pay | Admitting: Family Medicine

## 2015-08-05 ENCOUNTER — Ambulatory Visit (INDEPENDENT_AMBULATORY_CARE_PROVIDER_SITE_OTHER): Payer: Commercial Managed Care - HMO | Admitting: Family Medicine

## 2015-08-05 ENCOUNTER — Encounter: Payer: Self-pay | Admitting: Family Medicine

## 2015-08-05 VITALS — BP 136/60 | HR 68 | Temp 98.0°F | Wt 175.0 lb

## 2015-08-05 DIAGNOSIS — I10 Essential (primary) hypertension: Secondary | ICD-10-CM

## 2015-08-05 DIAGNOSIS — E785 Hyperlipidemia, unspecified: Secondary | ICD-10-CM | POA: Diagnosis not present

## 2015-08-05 MED ORDER — CHLORTHALIDONE 25 MG PO TABS: 25.0000 mg | ORAL_TABLET | Freq: Every day | ORAL | Status: DC

## 2015-08-05 MED ORDER — AMLODIPINE BESYLATE 10 MG PO TABS: 10.0000 mg | ORAL_TABLET | Freq: Every day | ORAL | Status: DC

## 2015-08-05 MED ORDER — SIMVASTATIN 40 MG PO TABS: 40.0000 mg | ORAL_TABLET | Freq: Every day | ORAL | Status: DC

## 2015-08-05 NOTE — Addendum Note (Signed)
Addended by: Clyde Lundborg A on: 08/05/2015 10:47 AM   Modules accepted: Orders

## 2015-08-05 NOTE — Patient Instructions (Addendum)
Blood pressure looks great. Continue current medicine  Glad the stomach pain got better- removed prilosec from your list.   Continue cholesterol medicine and gout medicine  See me back and come in fasting next visit and we will update all your bloodwork

## 2015-08-05 NOTE — Progress Notes (Signed)
[name], MD  Subjective:  Isaiah Davidson is a 82 y.o. year old very pleasant male patient who presents for/with See problem oriented charting ROS- No chest pain or shortness of breath. No headache or blurry vision. Patient denies any worsening of memory- he had declined namenda addition at neurology  Past Medical History-  Patient Active Problem List   Diagnosis Date Noted  . MCI (mild cognitive impairment) with memory loss 05/14/2014    Priority: High  . Seizure disorder ([place]) 04/19/2011    Priority: High  . 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 Hyperlipidemia 02/22/2007    Priority: Medium  . Essential hypertension 02/22/2007    Priority: Medium  . Finger infection 06/11/2014    Priority: Low  . Carpal tunnel syndrome 08/18/2010    Priority: Low  . Cyst and pseudocyst of pancreas 04/12/2010    Priority: Low    Medications- reviewed and updated Current Outpatient Prescriptions  Medication Sig Dispense Refill  . amLODipine (NORVASC) 10 MG tablet Take 1 tablet (10 mg total) by mouth daily. 90 tablet 3  . aspirin 81 MG tablet Take 81 mg by mouth daily.      . chlorthalidone (HYGROTON) 25 MG tablet Take 1 tablet (25 mg total) by mouth daily. 90 tablet 3  . donepezil (ARICEPT) 5 MG tablet Take 1 tablet by mouth twice a day 180 tablet 1  . Febuxostat 80 MG TABS Take 1 tablet (80 mg total) by mouth daily. 30 tablet 3  . levETIRAcetam (KEPPRA) 500 MG tablet TAKE ONE TABLET BY MOUTH TWICE DAILY 180 tablet 1  . Multiple Vitamin (MULTIVITAMIN) capsule Take 1 capsule by mouth daily.      [name] Kitchen omeprazole (PRILOSEC) 40 MG capsule Take 1 capsule (40 mg total) by mouth daily. 30 capsule 3  . simvastatin (ZOCOR) 40 MG tablet Take 1 tablet by mouth once daily 90 tablet 2   No current facility-administered medications for this visit.    Objective: BP 136/60 mmHg  Pulse 68  Temp(Src) 98 F (36.7 C)  Wt 175 lb (79.379 kg) Gen: NAD, resting comfortably CV: RRR no  murmurs rubs or gallops Lungs: CTAB no crackles, wheeze, rhonchi Abdomen: soft/nontender/nondistended/normal bowel sounds. No rebound or guarding.  Ext: no edema Skin: warm, dry Neuro: grossly normal, moves all extremities   Assessment/Plan:  No longer having any abdominal pain after prilosec for ulcer- removed from medicine list  Hyperlipidemia S: well controlled on simvastatin 40mg . No myalgias.  Lab Results  Component Value Date   CHOL 201* 09/24/2013   HDL 51.90 09/24/2013   LDLCALC 93 09/24/2013   LDLDIRECT 99.0 01/19/2015   TRIG 281.0* 09/24/2013   CHOLHDL 4 09/24/2013   A/P: unclear benefit for primary prevention at his age but has done well with simvastatin- will continue    Essential hypertension S: controlled. On Chlorthalidone 25mg , amlodipine 10mg   BP Readings from Last 3 Encounters:  08/05/15 136/60  05/14/15 142/78  02/02/15 130/82  A/P:Continue current meds:  Doing very well    Return in about 6 months (around 02/02/2016) for physical. schedule at check out. . Return precautions advised. At that time will need to get uric acid as well on uloric- fortunately has had no gout flares [facility] to refill all non neurology meds

## 2015-08-05 NOTE — Assessment & Plan Note (Signed)
S: controlled. On Chlorthalidone 25mg , amlodipine 10mg   BP Readings from Last 3 Encounters:  08/05/15 136/60  05/14/15 142/78  02/02/15 130/82  A/P:Continue current meds:  Doing very well

## 2015-08-05 NOTE — Assessment & Plan Note (Signed)
S: well controlled on simvastatin 40mg . No myalgias.  Lab Results  Component Value Date   CHOL 201* 09/24/2013   HDL 51.90 09/24/2013   LDLCALC 93 09/24/2013   LDLDIRECT 99.0 01/19/2015   TRIG 281.0* 09/24/2013   CHOLHDL 4 09/24/2013   A/P: unclear benefit for primary prevention at his age but has done well with simvastatin- will continue

## 2015-08-12 ENCOUNTER — Other Ambulatory Visit: Payer: Self-pay

## 2015-08-12 MED ORDER — LEVETIRACETAM 500 MG PO TABS: 500.0000 mg | ORAL_TABLET | Freq: Two times a day (BID) | ORAL | Status: DC

## 2015-08-12 MED ORDER — DONEPEZIL HCL 5 MG PO TABS: 5.0000 mg | ORAL_TABLET | Freq: Two times a day (BID) | ORAL | Status: DC

## 2015-08-18 ENCOUNTER — Telehealth: Payer: Self-pay

## 2015-08-18 NOTE — Telephone Encounter (Signed)
I spoke with Isaiah Davidson at [facility] [phone]).  He said they did receive the PA request sent via covermymeds, and it is still being reviewed.  A decision should be made within 72 hours Ref # [id number].  (They have the patient DOB listed as Dec 04, 1931)  Ins will fax a response to the number provided on request, [phone].

## 2015-08-19 NOTE — Telephone Encounter (Signed)
PA approved for donepezil until 08/17/2016 from [facility]. Member ID [id number]

## 2015-11-10 ENCOUNTER — Encounter: Payer: Self-pay | Admitting: Adult Health

## 2015-11-10 ENCOUNTER — Ambulatory Visit (INDEPENDENT_AMBULATORY_CARE_PROVIDER_SITE_OTHER): Payer: Commercial Managed Care - HMO | Admitting: Adult Health

## 2015-11-10 VITALS — BP 141/69 | HR 71 | Ht 69.0 in | Wt 176.2 lb

## 2015-11-10 DIAGNOSIS — R569 Unspecified convulsions: Secondary | ICD-10-CM | POA: Diagnosis not present

## 2015-11-10 DIAGNOSIS — R413 Other amnesia: Secondary | ICD-10-CM | POA: Diagnosis not present

## 2015-11-10 MED ORDER — LEVETIRACETAM 500 MG PO TABS: 500.0000 mg | ORAL_TABLET | Freq: Two times a day (BID) | ORAL | Status: DC

## 2015-11-10 MED ORDER — DONEPEZIL HCL 5 MG PO TABS: 5.0000 mg | ORAL_TABLET | Freq: Two times a day (BID) | ORAL | Status: DC

## 2015-11-10 NOTE — Progress Notes (Signed)
PATIENT: Isaiah Davidson DOB: 25-Feb-1932  REASON FOR VISIT: follow up- seizures, memory loss HISTORY FROM: patient  HISTORY OF PRESENT ILLNESS: Isaiah Davidson is an 82 year old male with a history of seizures and memory loss. He returns today for follow-up. He continues to take Keppra and Aricept. He reports that he has not had any additional seizure events. He continues to operate a motor vehicle without difficulty. The patient feels that his memory has remained the same. He is able to complete all ADLs independently. He was at home with his wife. He denies having to give up anything due to his memory. He states that he is sleeping well. Denies any hallucinations or vivid dreams. Denies any changes with his gait or balance. He returns today for an evaluation.    05/14/15: Isaiah Davidson is an 82 year old male with a history of seizures and memory loss. He returns today for follow-up. He is currently taking Keppra and Aricept. He reports that he has not had any seizure events. He is able to complete all ADLs independently. He operates a Teacher, music without difficulty. The patient feels that he does not have any issues with his memory. His wife is with him today at the visit and she's not noticed any significant changes. He states that he sleeps well at night. Denies any agitation or aggressiveness. Denies any new medical issues. Patient states that since he is retired he has no need to remember the date so therefore that why he does not score good on her memory test. He returns today for follow-up.   HISTORY 05/14/14 (Isaiah Davidson): Isaiah Davidson, A married, right handed 49 -year-old african [place] male, presents for followup for memory loss and seizure control. [name] Kitchen He was last seen by Isaiah Davidson 05-13-13. A head CT in past showed established white matter disease. He has COPD, and high cholersterol.  No seizure activity in over 4 years. Hx of Carpal Tunnel Surgery. He has tolerated Aricept 5mg   bid , after presenting with GI side effects to 10mg  dose.  Memory per [facility] 05-14-14 was 24-30 points .  No increased confusion or behavior issues according to wife.  Remains independent in ADL's. Minimal exercise but does "keep up the yard", his appetite is reportedly good and he is sleeping well at night. His wife has always balanced the cheques and he has always done the maintenance on cars, garden and Appliances. He still does a "little plumming".  HISTORY: 05-18-12 Isaiah Davidson Rv for this established mild cognitive impairmed patient, who scores today 23-30 on MOCA and did score 29/30 on MMSE 6 month ago. he has had a single prolonged seizure 2011, no recurrence on Keppra. EEG reviewed. patient doing well on 5 mg daily aricept, but should try 5 mg bid po  REVIEW OF SYSTEMS: Out of a complete 14 system review of symptoms, the patient complains only of the following symptoms, and all other reviewed systems are negative.  See history of present illness  ALLERGIES: No Known Allergies  HOME MEDICATIONS: Outpatient Prescriptions Prior to Visit  Medication Sig Dispense Refill  . amLODipine (NORVASC) 10 MG tablet Take 1 tablet (10 mg total) by mouth daily. 90 tablet 3  . aspirin 81 MG tablet Take 81 mg by mouth daily.      . chlorthalidone (HYGROTON) 25 MG tablet Take 1 tablet (25 mg total) by mouth daily. 90 tablet 3  . donepezil (ARICEPT) 5 MG tablet Take 1 tablet (5 mg total) by mouth 2 (two) times daily.  180 tablet 1  . Febuxostat 80 MG TABS Take 1 tablet (80 mg total) by mouth daily. 30 tablet 3  . levETIRAcetam (KEPPRA) 500 MG tablet Take 1 tablet (500 mg total) by mouth 2 (two) times daily. 180 tablet 1  . Multiple Vitamin (MULTIVITAMIN) capsule Take 1 capsule by mouth daily.      . simvastatin (ZOCOR) 40 MG tablet Take 1 tablet (40 mg total) by mouth daily. 90 tablet 3   No facility-administered medications prior to visit.    PAST MEDICAL HISTORY: Past Medical History    Diagnosis Date  . HYPERLIPIDEMIA 02/22/2007  . HYPERTENSION 02/22/2007  . Seizures ([place])     treated for siezures around 2010  . MCI (mild cognitive impairment) with memory loss 05/14/2014    PAST SURGICAL HISTORY: Past Surgical History  Procedure Laterality Date  . Knee surgery      arthroscopic  . Carpal tunnel release      FAMILY HISTORY: Family History  Problem Relation Age of Onset  . Diabetes Paternal Grandmother     raised by grandparents    SOCIAL HISTORY: Social History   Social History  . Marital Status: Married    Spouse Name: [name]  . Number of Children: 2  . Years of Education: 10   Occupational History  .     Social History Main Topics  . Smoking status: Former Smoker -- 1.00 packs/day for 30 years    Types: Cigars, Cigarettes    Quit date: 07/11/1978  . Smokeless tobacco: Never Used  . Alcohol Use: No  . Drug Use: No  . Sexual Activity: Not on file   Other Topics Concern  . Not on file   Social History Narrative   Patient is married ([name]).   Patient has 3 children. 2 grandkids. 3 greatgrandkids.    Patient is retired Administrator.    Patient is right-handed.   Patient has a 10th grade education.   Patient drinks one cup of coffee daily.      Hobbies: working in the yard- gardening competition with neighbor on who can grow best vegetables, "messing around the house"      07/21/14- wants wife to make decisions for him. Not sure if he has advanced directives.             PHYSICAL EXAM  Filed Vitals:   11/10/15 0942  BP: 141/69  Pulse: 71  Height: 5\' 9"  (1.753 m)  Weight: 176 lb 3.2 oz (79.924 kg)   Body mass index is 26.01 kg/(m^2).   MMSE - Mini Mental State Exam 11/10/2015 05/14/2015  Orientation to time 4 1  Orientation to Place 5 4  Registration 3 3  Attention/ Calculation 1 5  Recall 3 2  Language- name 2 objects 2 2  Language- repeat 1 1  Language- follow 3 step command 3 3  Language- read & follow direction 1 1   Write a sentence 1 1  Copy design 1 0  Total score 25 23    Generalized: Well developed, in no acute distress   Neurological examination  Mentation: Alert oriented to time, place, history taking. Follows all commands speech and language fluent Cranial nerve II-XII: Pupils were equal round reactive to light. Extraocular movements were full, visual field were full on confrontational test. Facial sensation and strength were normal. Uvula tongue midline. Head turning and shoulder shrug  were normal and symmetric. Motor: The motor testing reveals 5 over 5 strength of all 4 extremities. Good  symmetric motor tone is noted throughout.  Sensory: Sensory testing is intact to soft touch on all 4 extremities. No evidence of extinction is noted.  Coordination: Cerebellar testing reveals good finger-nose-finger and heel-to-shin bilaterally.  Gait and station: Gait is normal. Tandem gait is normal. Romberg is negative. No drift is seen.  Reflexes: Deep tendon reflexes are symmetric and normal bilaterally.   DIAGNOSTIC DATA (LABS, IMAGING, TESTING) - I reviewed patient records, labs, notes, testing and imaging myself where available.  ASSESSMENT AND PLAN 82 y.o. year old male  has a past medical history of HYPERLIPIDEMIA (02/22/2007); HYPERTENSION (02/22/2007); Seizures ([place]); and MCI (mild cognitive impairment) with memory loss (05/14/2014). here with:  1. Seizures 2. Memory loss  Overall the patient is doing well. The patient will remain on Keppra 500 mg twice a day. His MMSE has remained stable. He will also remain on Aricept 10 mg daily- he does take this in a divided dose. Patient is advised that if his symptoms worsen or he develops any new symptoms he will let us know. He will follow-up in one year or sooner if needed.    Ward Givens, MSN, NP-C 11/10/2015, 9:58 AM [facility] [address], [place], [place] [address] [phone]

## 2015-11-10 NOTE — Patient Instructions (Signed)
Continue Aricept and Keppra Memory score is stable If your symptoms worsen or you develop new symptoms please let us know.

## 2015-11-10 NOTE — Progress Notes (Signed)
I agree with the assessment and plan as directed by NP .The patient is known to me .   Fidelia Cathers, MD  

## 2016-02-03 ENCOUNTER — Encounter: Payer: Self-pay | Admitting: Family Medicine

## 2016-02-03 ENCOUNTER — Ambulatory Visit (INDEPENDENT_AMBULATORY_CARE_PROVIDER_SITE_OTHER): Payer: Commercial Managed Care - HMO | Admitting: Family Medicine

## 2016-02-03 VITALS — BP 138/78 | HR 71 | Temp 98.5°F | Ht 69.25 in | Wt 175.6 lb

## 2016-02-03 DIAGNOSIS — E785 Hyperlipidemia, unspecified: Secondary | ICD-10-CM

## 2016-02-03 DIAGNOSIS — D179 Benign lipomatous neoplasm, unspecified: Secondary | ICD-10-CM | POA: Diagnosis not present

## 2016-02-03 DIAGNOSIS — Z0000 Encounter for general adult medical examination without abnormal findings: Secondary | ICD-10-CM | POA: Diagnosis not present

## 2016-02-03 DIAGNOSIS — I10 Essential (primary) hypertension: Secondary | ICD-10-CM | POA: Diagnosis not present

## 2016-02-03 DIAGNOSIS — M119 Crystal arthropathy, unspecified: Secondary | ICD-10-CM

## 2016-02-03 LAB — CBC
HEMATOCRIT: 42.3 % (ref 39.0–52.0)
HEMOGLOBIN: 14.1 g/dL (ref 13.0–17.0)
MCHC: 33.3 g/dL (ref 30.0–36.0)
MCV: 87.3 fl (ref 78.0–100.0)
PLATELETS: 317 10*3/uL (ref 150.0–400.0)
RBC: 4.85 Mil/uL (ref 4.22–5.81)
RDW: 13.5 % (ref 11.5–15.5)
WBC: 8.1 10*3/uL (ref 4.0–10.5)

## 2016-02-03 LAB — LIPID PANEL
CHOL/HDL RATIO: 4
CHOLESTEROL: 235 mg/dL — AB (ref 0–200)
HDL: 54.3 mg/dL (ref >39.00)
NonHDL: 181.1
Triglycerides: 234 mg/dL — ABNORMAL HIGH (ref 0.0–149.0)
VLDL: 46.8 mg/dL — AB (ref 0.0–40.0)

## 2016-02-03 LAB — COMPREHENSIVE METABOLIC PANEL
ALBUMIN: 4.5 g/dL (ref 3.5–5.2)
ALT: 18 U/L (ref 0–53)
AST: 25 U/L (ref 0–37)
Alkaline Phosphatase: 87 U/L (ref 39–117)
BUN: 19 mg/dL (ref 6–23)
CALCIUM: 10.1 mg/dL (ref 8.4–10.5)
CHLORIDE: 99 meq/L (ref 96–112)
CO2: 32 mEq/L (ref 19–32)
Creatinine, Ser: 1.36 mg/dL (ref 0.40–1.50)
GFR: 64.49 mL/min (ref >60.00)
Glucose, Bld: 85 mg/dL (ref 70–99)
POTASSIUM: 4.2 meq/L (ref 3.5–5.1)
Sodium: 140 mEq/L (ref 135–145)
Total Bilirubin: 0.7 mg/dL (ref 0.2–1.2)
Total Protein: 8.2 g/dL (ref 6.0–8.3)

## 2016-02-03 LAB — LDL CHOLESTEROL, DIRECT: Direct LDL: 101 mg/dL

## 2016-02-03 LAB — URIC ACID: Uric Acid, Serum: 9.4 mg/dL — ABNORMAL HIGH (ref 4.0–7.8)

## 2016-02-03 NOTE — Progress Notes (Signed)
Pre visit review using our clinic review tool, if applicable. No additional management support is needed unless otherwise documented below in the visit note. 

## 2016-02-03 NOTE — Progress Notes (Signed)
Phone: [phone]  Subjective:  Patient presents today for their annual physical. Chief complaint-noted.   See problem oriented charting- ROS- full  review of systems was completed and negative except for mild aches in pains. No chest pain or shortness of breath. No headache or blurry vision.   The following were reviewed and entered/updated in epic: Past Medical History:  Diagnosis Date  . HYPERLIPIDEMIA 02/22/2007  . HYPERTENSION 02/22/2007  . MCI (mild cognitive impairment) with memory loss 05/14/2014  . Seizures ([place])    treated for siezures around 2010   Patient Active Problem List   Diagnosis Date Noted  . MCI (mild cognitive impairment) with memory loss 05/14/2014    Priority: High  . Seizure disorder ([place]) 04/19/2011    Priority: High  . 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 Hyperlipidemia 02/22/2007    Priority: Medium  . Essential hypertension 02/22/2007    Priority: Medium  . Finger infection 06/11/2014    Priority: Low  . Carpal tunnel syndrome 08/18/2010    Priority: Low  . Cyst and pseudocyst of pancreas 04/12/2010    Priority: Low   Past Surgical History:  Procedure Laterality Date  . CARPAL TUNNEL RELEASE    . KNEE SURGERY     arthroscopic    Family History  Problem Relation Age of Onset  . Diabetes Paternal Grandmother     raised by grandparents    Medications- reviewed and updated Current Outpatient Prescriptions  Medication Sig Dispense Refill  . amLODipine (NORVASC) 10 MG tablet Take 1 tablet (10 mg total) by mouth daily. 90 tablet 3  . aspirin 81 MG tablet Take 81 mg by mouth daily.      . chlorthalidone (HYGROTON) 25 MG tablet Take 1 tablet (25 mg total) by mouth daily. 90 tablet 3  . donepezil (ARICEPT) 5 MG tablet Take 1 tablet (5 mg total) by mouth 2 (two) times daily. 180 tablet 3  . Febuxostat 80 MG TABS Take 1 tablet (80 mg total) by mouth daily. 30 tablet 3  . levETIRAcetam (KEPPRA) 500 MG tablet Take 1 tablet (500  mg total) by mouth 2 (two) times daily. 180 tablet 3  . Multiple Vitamin (MULTIVITAMIN) capsule Take 1 capsule by mouth daily.      . simvastatin (ZOCOR) 40 MG tablet Take 1 tablet (40 mg total) by mouth daily. 90 tablet 3   No current facility-administered medications for this visit.     Allergies-reviewed and updated No Known Allergies  Social History   Social History  . Marital status: Married    Spouse name: [name]  . Number of children: 2  . Years of education: 10   Occupational History  .  Retired   Social History Main Topics  . Smoking status: Former Smoker    Packs/day: 1.00    Years: 30.00    Types: Cigars, Cigarettes    Quit date: 07/11/1978  . Smokeless tobacco: Never Used  . Alcohol use No  . Drug use: No  . Sexual activity: Not Asked   Other Topics Concern  . None   Social History Narrative   Patient is married ([name]).   Patient has 3 children. 2 grandkids. 3 greatgrandkids.    Patient is retired Administrator.    Patient is right-handed.   Patient has a 10th grade education.   Patient drinks one cup of coffee daily.      Hobbies: working in the yard- gardening competition with neighbor on who can grow  best vegetables, "messing around the house"      07/21/14- wants wife to make decisions for him. Not sure if he has advanced directives.           Objective: BP 138/78 (BP Location: Left Arm, Patient Position: Sitting, Cuff Size: Normal)   Pulse 71   Temp 98.5 F (36.9 C) (Oral)   Ht 5' 9.25" (1.759 m)   Wt 175 lb 9.6 oz (79.7 kg)   SpO2 95%   BMI 25.74 kg/m  Gen: NAD, resting comfortably HEENT: Mucous membranes are moist. Oropharynx normal Neck: no thyromegaly CV: RRR no murmurs rubs or gallops Lungs: CTAB no crackles, wheeze, rhonchi Abdomen: soft/nontender/nondistended/normal bowel sounds. No rebound or guarding.  Ext: no edema Skin: warm, dry, behind right eye 2 x 2 cm freely mobile subcutaneous lesion Neuro: grossly normal, moves  all extremities, PERRLA  Assessment/Plan:  82 y.o. male presenting for annual physical.  Health Maintenance counseling: 1. Anticipatory guidance: Patient counseled regarding regular eye exams, wearing seatbelts.  2. Risk factor reduction:  Advised patient of need for regular exercise and diet rich and fruits and vegetables to reduce risk of heart attack and stroke.  3. Immunizations/screenings/ancillary studies Immunization History  Administered Date(s) Administered  . Influenza Split 04/19/2011, 04/03/2012  . Influenza Whole 06/05/2007, 05/20/2009  . Influenza,inj,Quad PF,36+ Mos 04/01/2013, 05/26/2015  . Influenza-Unspecified 04/19/2014  . Pneumococcal Conjugate-13 07/21/2014  . Pneumococcal Polysaccharide-23 06/18/2002  . Td 01/28/2010  4. Prostate cancer screening- would not advise screening at patient age  14. Colon cancer screening - 2013 adenoma with no recall due to age  Status of chronic or acute concerns  Seizure disorder- on keppra through neurology  MCI- on aricept 5mg  through neurology. 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 Growing over last 6 months.   Orders Placed This Encounter  Procedures  . CBC    [place]  . Comprehensive metabolic panel    [place]    Order Specific Question:   Has the patient fasted?    Answer:   No  . Lipid panel    [place]    Order Specific Question:   Has the patient fasted?    Answer:   No  . Uric Acid  . Ambulatory referral to Plastic Surgery    Referral Priority:   Routine    Referral Type:   Surgical    Referral Reason:   Specialty Services Required    Requested Specialty:   Plastic Surgery    Number of Visits Requested:   1   Return precautions advised.   [name], MD

## 2016-02-03 NOTE — Patient Instructions (Signed)
We will call you within a week about your referral to plastic surgery about spot behind right eye (likely lipoma). If you do not hear within 2 weeks, give [place] a call.   Update labs before you leave  Stay active like you are! But make sure to know your limitations  Lipoma A lipoma is a noncancerous (benign) tumor that is made up of fat cells. This is a very common type of soft-tissue growth. Lipomas are usually found under the skin (subcutaneous). They may occur in any tissue of the body that contains fat. Common areas for lipomas to appear include the back, shoulders, buttocks, and thighs. Lipomas grow slowly, and they are usually painless. Most lipomas do not cause problems and do not require treatment. CAUSES The cause of this condition is not known. RISK FACTORS This condition is more likely to develop in:  People who are 70-71 years old.  People who have a family history of lipomas. SYMPTOMS A lipoma usually appears as a small, round bump under the skin. It may feel soft or rubbery, but the firmness can vary. Most lipomas are not painful. However, a lipoma may become painful if it is located in an area where it pushes on nerves. DIAGNOSIS A lipoma can usually be diagnosed with a physical exam. You may also have tests to confirm the diagnosis and to rule out other conditions. Tests may include:  Imaging tests, such as a CT scan or MRI.  Removal of a tissue sample to be looked at under a microscope (biopsy). TREATMENT Treatment is not needed for small lipomas that are not causing problems. If a lipoma continues to get bigger or it causes problems, removal is often the best option. Lipomas can also be removed to improve appearance. Removal of a lipoma is usually done with a surgery in which the fatty cells and the surrounding capsule are removed. Most often, a medicine that numbs the area (local anesthetic) is used for this procedure. HOME CARE INSTRUCTIONS  Keep all follow-up visits as  directed by your health care provider. This is important. SEEK MEDICAL CARE IF:  Your lipoma becomes larger or hard.  Your lipoma becomes painful, red, or increasingly swollen. These could be signs of infection or a more serious condition.   This information is not intended to replace advice given to you by your health care provider. Make sure you discuss any questions you have with your health care provider.   Document Released: 06/17/2002 Document Revised: 11/11/2014 Document Reviewed: 06/23/2014 Elsevier Interactive Patient Education [employer].

## 2016-06-06 ENCOUNTER — Ambulatory Visit (HOSPITAL_COMMUNITY)
Admission: EM | Admit: 2016-06-06 | Discharge: 2016-06-06 | Disposition: A | Discharge status: Home / Self Care | Payer: Commercial Managed Care - HMO | Attending: Emergency Medicine | Admitting: Emergency Medicine

## 2016-06-06 ENCOUNTER — Encounter (HOSPITAL_COMMUNITY): Payer: Self-pay | Admitting: *Deleted

## 2016-06-06 DIAGNOSIS — J069 Acute upper respiratory infection, unspecified: Secondary | ICD-10-CM

## 2016-06-06 DIAGNOSIS — B9789 Other viral agents as the cause of diseases classified elsewhere: Secondary | ICD-10-CM | POA: Diagnosis not present

## 2016-06-06 MED ORDER — GUAIFENESIN ER 600 MG PO TB12: 600.0000 mg | ORAL_TABLET | Freq: Two times a day (BID) | ORAL | 0 refills | Status: DC | PRN

## 2016-06-06 MED ORDER — BENZONATATE 100 MG PO CAPS: 100.0000 mg | ORAL_CAPSULE | Freq: Three times a day (TID) | ORAL | 0 refills | Status: DC | PRN

## 2016-06-06 NOTE — ED Provider Notes (Signed)
CSN: [id number]     Arrival date & time 06/06/16  1103 History   First MD Initiated Contact with Patient 06/06/16 1243     Chief Complaint  Patient presents with  . URI   (Consider location/radiation/quality/duration/timing/severity/associated sxs/prior Treatment) HPI  Patient started having a "chest cold" on Wednesday. He notes nasal and chest congestion on Wednesday. On Thursday he started having a productive cough of yellow phlegm.  Patient notes intermittent subjective fevers and chills.  Myalgias in the upper extremities as well.   Appetite has been poor since Wednesday but he's able to eat and drink okay.  No SOB, Chest pain, chest tenderness, otalgias, dizziness, lightheadedness, nausea, vomiting, diarrhea, sore throat. He notes that he was with his niece and her daughter who was sick over the holidays.     Past Medical History:  Diagnosis Date  . HYPERLIPIDEMIA 02/22/2007  . HYPERTENSION 02/22/2007  . MCI (mild cognitive impairment) with memory loss 05/14/2014  . Seizures ([place])    treated for siezures around 2010   Past Surgical History:  Procedure Laterality Date  . CARPAL TUNNEL RELEASE    . KNEE SURGERY     arthroscopic   Family History  Problem Relation Age of Onset  . Diabetes Paternal Grandmother     raised by grandparents   Social History  Substance Use Topics  . Smoking status: Former Smoker    Packs/day: 1.00    Years: 30.00    Types: Cigars, Cigarettes    Quit date: 07/11/1978  . Smokeless tobacco: Never Used  . Alcohol use No    Review of Systems  Constitutional: Positive for appetite change, chills, fatigue and fever. Negative for activity change and diaphoresis.  HENT: Positive for congestion. Negative for drooling, ear discharge, ear pain, postnasal drip, rhinorrhea, sinus pain, sinus pressure, sneezing, sore throat and trouble swallowing.   Eyes: Negative for photophobia, discharge, redness and itching.  Respiratory: Positive for cough. Negative  for choking, chest tightness, shortness of breath, wheezing and stridor.   Cardiovascular: Negative for chest pain and palpitations.  Gastrointestinal: Negative.   Endocrine: Negative.   Genitourinary: Negative.   Musculoskeletal: Positive for myalgias. Negative for arthralgias and gait problem.  Allergic/Immunologic: Negative.   Neurological: Negative for dizziness, light-headedness and headaches.  Hematological: Negative.   Psychiatric/Behavioral: Negative.     Allergies  Patient has no known allergies.  Home Medications   Prior to Admission medications   Medication Sig Start Date End Date Taking? Authorizing Provider  amLODipine (NORVASC) 10 MG tablet Take 1 tablet (10 mg total) by mouth daily. 08/05/15   Marin Olp, MD  aspirin 81 MG tablet Take 81 mg by mouth daily.      Historical Provider, MD  benzonatate (TESSALON PERLES) 100 MG capsule Take 1 capsule (100 mg total) by mouth 3 (three) times daily as needed for cough. 06/06/16   Archie Patten, MD  chlorthalidone (HYGROTON) 25 MG tablet Take 1 tablet (25 mg total) by mouth daily. 08/05/15   Marin Olp, MD  donepezil (ARICEPT) 5 MG tablet Take 1 tablet (5 mg total) by mouth 2 (two) times daily. 11/10/15   Ward Givens, NP  Febuxostat 80 MG TABS Take 1 tablet (80 mg total) by mouth daily. 07/25/14   Lyndal Pulley, DO  guaiFENesin (MUCINEX) 600 MG 12 hr tablet Take 1 tablet (600 mg total) by mouth 2 (two) times daily as needed for to loosen phlegm. 06/06/16   Archie Patten, MD  levETIRAcetam (KEPPRA)  500 MG tablet Take 1 tablet (500 mg total) by mouth 2 (two) times daily. 11/10/15   Ward Givens, NP  Multiple Vitamin (MULTIVITAMIN) capsule Take 1 capsule by mouth daily.      Historical Provider, MD  simvastatin (ZOCOR) 40 MG tablet Take 1 tablet (40 mg total) by mouth daily. 08/05/15   Marin Olp, MD   Meds Ordered and Administered this Visit  Medications - No data to display  BP 148/90 (BP Location: Right  Arm)   Pulse 73   Temp 99.1 F (37.3 C) (Oral)   Resp 20   SpO2 97%  No data found.   Physical Exam  Constitutional: He appears well-developed and well-nourished. No distress.  HENT:  Head: Normocephalic and atraumatic.  Right Ear: External ear normal.  Left Ear: External ear normal.  Mouth/Throat: Oropharynx is clear and moist. No oropharyngeal exudate.  Eyes: Conjunctivae are normal. Pupils are equal, round, and reactive to light. Right eye exhibits no discharge. Left eye exhibits no discharge. No scleral icterus.  Neck: Normal range of motion. Neck supple.  Cardiovascular: Normal rate, regular rhythm, normal heart sounds and intact distal pulses.   No murmur heard. Pulmonary/Chest: Effort normal and breath sounds normal. No respiratory distress. He has no wheezes. He has no rales. He exhibits no tenderness.  Transmitted upper airway sounds  Abdominal: Soft. There is no tenderness.  Lymphadenopathy:    He has no cervical adenopathy.  Neurological: He is alert.  Skin: Skin is warm. Capillary refill takes less than 2 seconds. He is not diaphoretic.  Psychiatric: He has a normal mood and affect. His behavior is normal.    Urgent Care Course   Clinical Course     Procedures (including critical care time)  Labs Review Labs Reviewed - No data to display  Imaging Review No results found.     MDM   1. Viral upper respiratory tract infection    This is a relatively healthy 82 year old male presenting with chest congestion and cough for the last 6 days. He states that his symptoms are stable. He denies any underlying lung disease, and his lungs are clear on examination, with transmitted upper airway sounds. Discussed that this is most likely a viral infection that does not require antibiotics. Rx for guaifenesin and benzonatate for symptomatic treatment. Discussed return precautions with patient.     Archie Patten, MD 06/06/16 1340

## 2016-06-06 NOTE — Discharge Instructions (Signed)
You are coming in with chest congestion and cough.  I am not hearing anything worrisome for something like pneumonia. I have prescribed guaifenesin that you can take every 12 hours to loosen phlegm and benzonatate to help calm the cough. If your symptoms worsen or fail to improve, please follow up with your PCP's office.

## 2016-06-06 NOTE — ED Triage Notes (Signed)
Pt  Reports  He  Started  Having   Symptoms  Of  Body  Aches        Chills  Hot  And  Cold   Sensations      Onset  Of  Symptoms       Coughing  Up  Phlegm          Pt  Is   Sitting  Upright on the  Exam table  Speaking in  Complete  sentances

## 2016-06-09 ENCOUNTER — Ambulatory Visit (INDEPENDENT_AMBULATORY_CARE_PROVIDER_SITE_OTHER)
Admission: RE | Admit: 2016-06-09 | Discharge: 2016-06-09 | Disposition: A | Discharge status: Home / Self Care | Payer: Commercial Managed Care - HMO | Source: Ambulatory Visit | Attending: Adult Health | Admitting: Adult Health

## 2016-06-09 ENCOUNTER — Encounter: Payer: Self-pay | Admitting: Adult Health

## 2016-06-09 ENCOUNTER — Ambulatory Visit (INDEPENDENT_AMBULATORY_CARE_PROVIDER_SITE_OTHER): Payer: Commercial Managed Care - HMO | Admitting: Adult Health

## 2016-06-09 VITALS — BP 168/70 | HR 75 | Temp 98.5°F | Ht 68.4 in | Wt 174.7 lb

## 2016-06-09 DIAGNOSIS — R059 Cough, unspecified: Secondary | ICD-10-CM

## 2016-06-09 DIAGNOSIS — R05 Cough: Secondary | ICD-10-CM

## 2016-06-09 DIAGNOSIS — J0140 Acute pansinusitis, unspecified: Secondary | ICD-10-CM

## 2016-06-09 DIAGNOSIS — R0602 Shortness of breath: Secondary | ICD-10-CM | POA: Diagnosis not present

## 2016-06-09 MED ORDER — PREDNISONE 10 MG PO TABS: ORAL_TABLET | ORAL | 0 refills | Status: DC

## 2016-06-09 MED ORDER — DOXYCYCLINE HYCLATE 100 MG PO CAPS: 100.0000 mg | ORAL_CAPSULE | Freq: Two times a day (BID) | ORAL | 0 refills | Status: DC

## 2016-06-09 MED ORDER — DOXYCYCLINE HYCLATE 100 MG PO CAPS
100.0000 mg | ORAL_CAPSULE | Freq: Two times a day (BID) | ORAL | 0 refills | Status: DC
Start: 2016-06-09 — End: 2016-08-05

## 2016-06-09 MED ORDER — DOXYCYCLINE HYCLATE 100 MG PO CAPS
100.0000 mg | ORAL_CAPSULE | Freq: Two times a day (BID) | ORAL | 0 refills | Status: DC
Start: 2016-06-09 — End: 2016-06-09

## 2016-06-09 NOTE — Progress Notes (Signed)
Subjective:    Patient ID: Isaiah Davidson, male    DOB: 07/19/1931, 82 y.o.   MRN: [id number]  HPI 82 year old male who presents to the office today for the acute complaint of semi productive cough, chest congestion and nasal congestion with sinus pain and pressure and low grade subjective fevers.  His symptoms have been present for 9 days. 3-days ago he was seen at urgent care and was told that he had a viral illness. He was prescribed Tessalon Perles and Mucinex at the [facility] visit. Today in the office he feels as though his symptoms have started to become worse. He feels more short of breath and is having increased sinus pain and pressure. He does report being mailed to eat and drink normally although his appetite has decreased.  He denies any nausea vomiting or diarrhea  Review of Systems  Constitutional: Positive for activity change, appetite change, fatigue and fever.  HENT: Positive for congestion, postnasal drip, rhinorrhea, sinus pain and sinus pressure. Negative for sore throat.   Eyes: Negative.   Respiratory: Positive for cough and chest tightness.   Cardiovascular: Negative.   Neurological: Positive for headaches.  All other systems reviewed and are negative.  Past Medical History:  Diagnosis Date  . HYPERLIPIDEMIA 02/22/2007  . HYPERTENSION 02/22/2007  . MCI (mild cognitive impairment) with memory loss 05/14/2014  . Seizures ([place])    treated for siezures around 2010    Social History   Social History  . Marital status: Married    Spouse name: [name]  . Number of children: 2  . Years of education: 10   Occupational History  .  Retired   Social History Main Topics  . Smoking status: Former Smoker    Packs/day: 1.00    Years: 30.00    Types: Cigars, Cigarettes    Quit date: 07/11/1978  . Smokeless tobacco: Never Used  . Alcohol use No  . Drug use: No  . Sexual activity: Not on file   Other Topics Concern  . Not on file   Social History Narrative   Patient is  married ([name]).   Patient has 3 children. 2 grandkids. 3 greatgrandkids.    Patient is retired Administrator.    Patient is right-handed.   Patient has a 10th grade education.   Patient drinks one cup of coffee daily.      Hobbies: working in the yard- gardening competition with neighbor on who can grow best vegetables, "messing around the house"      07/21/14- wants wife to make decisions for him. Not sure if he has advanced directives.           Past Surgical History:  Procedure Laterality Date  . CARPAL TUNNEL RELEASE    . KNEE SURGERY     arthroscopic    Family History  Problem Relation Age of Onset  . Diabetes Paternal Grandmother     raised by grandparents    No Known Allergies  Current Outpatient Prescriptions on File Prior to Visit  Medication Sig Dispense Refill  . amLODipine (NORVASC) 10 MG tablet Take 1 tablet (10 mg total) by mouth daily. 90 tablet 3  . aspirin 81 MG tablet Take 81 mg by mouth daily.      . benzonatate (TESSALON PERLES) 100 MG capsule Take 1 capsule (100 mg total) by mouth 3 (three) times daily as needed for cough. 20 capsule 0  . chlorthalidone (HYGROTON) 25 MG tablet Take 1 tablet (25 mg  total) by mouth daily. 90 tablet 3  . donepezil (ARICEPT) 5 MG tablet Take 1 tablet (5 mg total) by mouth 2 (two) times daily. 180 tablet 3  . Febuxostat 80 MG TABS Take 1 tablet (80 mg total) by mouth daily. 30 tablet 3  . guaiFENesin (MUCINEX) 600 MG 12 hr tablet Take 1 tablet (600 mg total) by mouth 2 (two) times daily as needed for to loosen phlegm. 30 tablet 0  . levETIRAcetam (KEPPRA) 500 MG tablet Take 1 tablet (500 mg total) by mouth 2 (two) times daily. 180 tablet 3  . Multiple Vitamin (MULTIVITAMIN) capsule Take 1 capsule by mouth daily.      . simvastatin (ZOCOR) 40 MG tablet Take 1 tablet (40 mg total) by mouth daily. 90 tablet 3   No current facility-administered medications on file prior to visit.     BP (!) 168/70   Temp 98.5 F (36.9 C)  (Oral)   Ht 5' 8.4" (1.737 m)   Wt 174 lb 11.2 oz (79.2 kg)   BMI 26.25 kg/m       Objective:   Physical Exam  Constitutional: He is oriented to person, place, and time. He appears well-developed and well-nourished. No distress.  HENT:  Head: Normocephalic and atraumatic.  Right Ear: Hearing, tympanic membrane, external ear and ear canal normal.  Left Ear: Hearing, tympanic membrane, external ear and ear canal normal.  Nose: Mucosal edema and rhinorrhea present. Right sinus exhibits maxillary sinus tenderness and frontal sinus tenderness. Left sinus exhibits maxillary sinus tenderness and frontal sinus tenderness.  Mouth/Throat: Uvula is midline and oropharynx is clear and moist. No oropharyngeal exudate, posterior oropharyngeal edema, posterior oropharyngeal erythema or tonsillar abscesses.  Cardiovascular: Normal rate, regular rhythm, normal heart sounds and intact distal pulses.  Exam reveals no gallop and no friction rub.   No murmur heard. Pulmonary/Chest: Effort normal. No respiratory distress. He has wheezes. He has rhonchi. He has no rales. He exhibits no tenderness.  Neurological: He is alert and oriented to person, place, and time.  Skin: Skin is warm and dry. No rash noted. He is not diaphoretic. No erythema. No pallor.  Psychiatric: He has a normal mood and affect. His behavior is normal. Judgment and thought content normal.  Nursing note and vitals reviewed.     Assessment & Plan:  1. Acute non-recurrent pansinusitis - predniSONE (DELTASONE) 10 MG tablet; 40 mg x 3 days, 20 mg x 3 days, 10 mg x 3 days  Dispense: 21 tablet; Refill: 0 - doxycycline (VIBRAMYCIN) 100 MG capsule; Take 1 capsule (100 mg total) by mouth 2 (two) times daily.  Dispense: 14 capsule; Refill: 0  2. Cough - I will get a chest x-ray to rule out pneumonia since he has a low-grade fever and rhonchorous lung sounds as well as a productive cough. - DG Chest 2 View; Future - IMPRESSION: COPD changes with  bibasilar atelectasis. - predniSONE (DELTASONE) 10 MG tablet; 40 mg x 3 days, 20 mg x 3 days, 10 mg x 3 days  Dispense: 21 tablet; Refill: 0 Follow-up if no improvement in the next 2 or 3 days sooner if symptoms worsen - ipratropium-albuterol (DUONEB) 0.5-2.5 (3) MG/3ML nebulizer solution 3 mL; Take 3 mLs by nebulization once.  * Patient felt as though his breathing had improved after DuoNeb. Exam showed continued rhonchi and wheezing throughout lung fields. Oxygen saturation from 94% to 97% post DuoNeb.  Dorothyann Peng, NP

## 2016-06-10 MED ORDER — IPRATROPIUM-ALBUTEROL 0.5-2.5 (3) MG/3ML IN SOLN: 3.0000 mL | Freq: Once | RESPIRATORY_TRACT | Status: DC

## 2016-08-05 ENCOUNTER — Ambulatory Visit (INDEPENDENT_AMBULATORY_CARE_PROVIDER_SITE_OTHER): Payer: Commercial Managed Care - HMO | Admitting: Family Medicine

## 2016-08-05 ENCOUNTER — Encounter: Payer: Self-pay | Admitting: Family Medicine

## 2016-08-05 DIAGNOSIS — I10 Essential (primary) hypertension: Secondary | ICD-10-CM | POA: Diagnosis not present

## 2016-08-05 DIAGNOSIS — M119 Crystal arthropathy, unspecified: Secondary | ICD-10-CM | POA: Diagnosis not present

## 2016-08-05 DIAGNOSIS — G40909 Epilepsy, unspecified, not intractable, without status epilepticus: Secondary | ICD-10-CM

## 2016-08-05 DIAGNOSIS — E785 Hyperlipidemia, unspecified: Secondary | ICD-10-CM

## 2016-08-05 DIAGNOSIS — I700 Atherosclerosis of aorta: Secondary | ICD-10-CM

## 2016-08-05 NOTE — Assessment & Plan Note (Signed)
Seizure free over last year. Compliant with keppra. Sees neurolgoy

## 2016-08-05 NOTE — Assessment & Plan Note (Signed)
S: mild poorly controlled on simvastatin 40mg . No myalgias.  Lab Results  Component Value Date   CHOL 235 (H) 02/03/2016   HDL 54.30 02/03/2016   LDLCALC 93 09/24/2013   LDLDIRECT 101.0 02/03/2016   TRIG 234.0 (H) 02/03/2016   CHOLHDL 4 02/03/2016   A/P: given this is for primary prevention- will not push dose at this time despite aortic atherosclerosis- lipids largely reasonable

## 2016-08-05 NOTE — Assessment & Plan Note (Signed)
S: controlled on chlorthalidone 25mg , amlodipine 10mg .  BP Readings from Last 3 Encounters:  08/05/16 138/72  06/09/16 (!) 168/70  06/06/16 148/90  A/P:Continue current meds:  Much improve dtoday compared to when he was ill prior

## 2016-08-05 NOTE — Assessment & Plan Note (Signed)
S: compliant with uloric. No gout flares A/P: uric acid high but no gout flrares- will not adjust dose though could go to 120 mg uloric if needed

## 2016-08-05 NOTE — Progress Notes (Signed)
Pre visit review using our clinic review tool, if applicable. No additional management support is needed unless otherwise documented below in the visit note. 

## 2016-08-05 NOTE — Progress Notes (Signed)
Subjective:  Isaiah Davidson is a 83 y.o. year old very pleasant male patient who presents for/with See problem oriented charting ROS- no chest pain. Does have some SOB as noted below. No headache or blurry vision   Past Medical History-  Patient Active Problem List   Diagnosis Date Noted  . MCI (mild cognitive impairment) with memory loss 05/14/2014    Priority: High  . Seizure disorder ([place]) 04/19/2011    Priority: High  . 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 Hyperlipidemia 02/22/2007    Priority: Medium  . Essential hypertension 02/22/2007    Priority: Medium  . Finger infection 06/11/2014    Priority: Low  . Carpal tunnel syndrome 08/18/2010    Priority: Low  . Cyst and pseudocyst of pancreas 04/12/2010    Priority: Low  . Aortic atherosclerosis ([place]) 08/05/2016    Medications- reviewed and updated Current Outpatient Prescriptions  Medication Sig Dispense Refill  . amLODipine (NORVASC) 10 MG tablet Take 1 tablet (10 mg total) by mouth daily. 90 tablet 3  . aspirin 81 MG tablet Take 81 mg by mouth daily.      . chlorthalidone (HYGROTON) 25 MG tablet Take 1 tablet (25 mg total) by mouth daily. 90 tablet 3  . donepezil (ARICEPT) 5 MG tablet Take 1 tablet (5 mg total) by mouth 2 (two) times daily. 180 tablet 3  . Febuxostat 80 MG TABS Take 1 tablet (80 mg total) by mouth daily. 30 tablet 3  . levETIRAcetam (KEPPRA) 500 MG tablet Take 1 tablet (500 mg total) by mouth 2 (two) times daily. 180 tablet 3  . Multiple Vitamin (MULTIVITAMIN) capsule Take 1 capsule by mouth daily.      . simvastatin (ZOCOR) 40 MG tablet Take 1 tablet (40 mg total) by mouth daily. 90 tablet 3   Current Facility-Administered Medications  Medication Dose Route Frequency Provider Last Rate Last Dose  . ipratropium-albuterol (DUONEB) 0.5-2.5 (3) MG/3ML nebulizer solution 3 mL  3 mL Nebulization Once Dorothyann Peng, NP        Objective: BP 138/72 (BP Location: Left Arm, Patient Position:  Sitting, Cuff Size: Large)   Pulse 67   Temp 97.8 F (36.6 C) (Oral)   Ht 5\' 8"  (1.727 m)   Wt 178 lb 12.8 oz (81.1 kg)   SpO2 97%   BMI 27.19 kg/m  Gen: NAD, resting comfortably CV: RRR no murmurs rubs or gallops Lungs: CTAB no crackles, wheeze, rhonchi Abdomen: soft/nontender/nondistended/normal bowel sounds. Reasonable weight Ext: no edema Neuro: normal gait  Assessment/Plan:  Hyperlipidemia S: mild poorly controlled on simvastatin 40mg . No myalgias.  Lab Results  Component Value Date   CHOL 235 (H) 02/03/2016   HDL 54.30 02/03/2016   LDLCALC 93 09/24/2013   LDLDIRECT 101.0 02/03/2016   TRIG 234.0 (H) 02/03/2016   CHOLHDL 4 02/03/2016   A/P: given this is for primary prevention- will not push dose at this time despite aortic atherosclerosis- lipids largely reasonable  Essential hypertension S: controlled on chlorthalidone 25mg , amlodipine 10mg .  BP Readings from Last 3 Encounters:  08/05/16 138/72  06/09/16 (!) 168/70  06/06/16 148/90  A/P:Continue current meds:  Much improve dtoday compared to when he was ill prior  Crystal arthropathy S: compliant with uloric. No gout flares A/P: uric acid high but no gout flrares- will not adjust dose though could go to 120 mg uloric if needed  Seizure disorder Seizure free over last year. Compliant with keppra. Sees neurolgoy  Aortic atherosclerosis ([place])  S: discussed finding from x-ray in november A/P: focus on risk factor modification- is doing well with statin and BP control. LDL has been just above 100- do not feel strongly about pushing dose at his age.   We also discussed his chronic shortness of breath- saw cards in 2014 and not worsening. Cardiopulmonary stress testing stated likely deconditioning- advised regular exercise. He is trying to walk a block in his neighborhood daily- will try to slowly graduate  Plastic surgery referral last visit for Lipoma 2x2 behind right eye- referral was faxed to plastic surgery. I  printed this for him so he could call. [employer]- does not require referral this year and they should have info from last year. Can place if needed for 2018  Return in about 6 months (around 02/02/2017) for physical. 3 month awv susan advised  Return precautions advised.  [name], MD

## 2016-08-05 NOTE — Assessment & Plan Note (Signed)
S: discussed finding from x-ray in november A/P: focus on risk factor modification- is doing well with statin and BP control. LDL has been just above 100- do not feel strongly about pushing dose at his age.   We also discussed his chronic shortness of breath- saw cards in 2014 and not worsening. Cardiopulmonary stress testing stated likely deconditioning- advised regular exercise. He is trying to walk a block in his neighborhood daily- will try to slowly graduate

## 2016-08-05 NOTE — Patient Instructions (Signed)
I would also like for you to sign up for an annual wellness visit with our nurse, Manuela Schwartz, who specializes in the annual wellness exam in about 3 months to check in. This is a free benefit under medicare that may help [place] find additional ways to help you. Some highlights are reviewing medications, lifestyle, and doing a dementia screen.   Then see me in 6 months for physical  No changes today

## 2016-08-08 ENCOUNTER — Other Ambulatory Visit: Payer: Self-pay

## 2016-08-08 ENCOUNTER — Telehealth: Payer: Self-pay | Admitting: Family Medicine

## 2016-08-08 MED ORDER — CHLORTHALIDONE 25 MG PO TABS: 25.0000 mg | ORAL_TABLET | Freq: Every day | ORAL | 3 refills | Status: DC

## 2016-08-08 MED ORDER — AMLODIPINE BESYLATE 10 MG PO TABS: 10.0000 mg | ORAL_TABLET | Freq: Every day | ORAL | 3 refills | Status: DC

## 2016-08-08 MED ORDER — SIMVASTATIN 40 MG PO TABS: 40.0000 mg | ORAL_TABLET | Freq: Every day | ORAL | 3 refills | Status: DC

## 2016-08-08 NOTE — Telephone Encounter (Signed)
Prescriptions sent to pharmacy as requested 

## 2016-08-08 NOTE — Telephone Encounter (Signed)
Pt request refill  amLODipine (NORVASC) 10 MG tablet chlorthalidone (HYGROTON) 25 MG tablet simvastatin (ZOCOR) 40 MG tablet  [facility]

## 2016-09-15 ENCOUNTER — Telehealth: Payer: Self-pay | Admitting: Adult Health

## 2016-09-15 ENCOUNTER — Other Ambulatory Visit: Payer: Self-pay | Admitting: *Deleted

## 2016-09-15 MED ORDER — LEVETIRACETAM 500 MG PO TABS: 500.0000 mg | ORAL_TABLET | Freq: Two times a day (BID) | ORAL | 0 refills | Status: DC

## 2016-09-15 NOTE — Telephone Encounter (Signed)
Patients wife [name] called office in reference to levETIRAcetam (KEPPRA) 500 MG tablet.  Patient received a delivery of medications last night, but Keppra was not in the package.  Per wife [facility] pharmacy said it will be 5 days before he will get the medication and to call us to receive 10 day supply.  [place]  Please call

## 2016-09-15 NOTE — Telephone Encounter (Signed)
One month supply sent to requested local pharmacy, in case the mail order is delayed.

## 2016-11-03 ENCOUNTER — Ambulatory Visit (INDEPENDENT_AMBULATORY_CARE_PROVIDER_SITE_OTHER): Payer: Commercial Managed Care - HMO

## 2016-11-03 ENCOUNTER — Telehealth: Payer: Self-pay

## 2016-11-03 VITALS — BP 136/60 | HR 70 | Ht 69.0 in | Wt 180.0 lb

## 2016-11-03 DIAGNOSIS — Z0000 Encounter for general adult medical examination without abnormal findings: Secondary | ICD-10-CM | POA: Diagnosis not present

## 2016-11-03 NOTE — Progress Notes (Signed)
I have reviewed and agree with note, evaluation, plan. Separate phone note/message about plastic surgery referral.   [name], MD

## 2016-11-03 NOTE — Patient Instructions (Addendum)
Isaiah Davidson , Thank you for taking time to come for your Medicare Wellness Visit. I appreciate your ongoing commitment to your health goals. Please review the following plan we discussed and let me know if I can assist you in the future.   To schedule eye apt w [facility]   Shingrix is a vaccine for the prevention of Shingles in Adults 50 and older.  If you are on Medicare, you can request a prescription from your doctor to be filled at a pharmacy.  Please check with your benefits regarding applicable copays or out of pocket expenses.  The Shingrix is given in 2 vaccines approx 8 weeks apart. You must receive the 2nd dose prior to 6 months from receipt of the first.   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 patient          Would like to have to part time job;  If not; will do a little more yard work        This is a list of the screening recommended for you and due dates:  Health Maintenance  Topic Date Due  . Flu Shot  02/08/2017  . Tetanus Vaccine  01/29/2020  . Pneumonia vaccines  Completed        Fall Prevention in the Home Falls can cause injuries. They can happen to people of all ages. There are many things you can do to make your home safe and to help prevent falls. What can I do on the outside of my home?  Regularly fix the edges of walkways and driveways and fix any cracks.  Remove anything that might make you trip as you walk through a door, such as a raised step or threshold.  Trim any bushes or trees on the path to your home.  Use bright outdoor lighting.  Clear any walking paths of anything that might make someone trip, such as rocks or tools.  Regularly check to see if handrails are loose or broken. Make sure that both sides of any steps have handrails.  Any raised decks and porches should have guardrails on the edges.  Have any leaves, snow, or ice cleared regularly.  Use sand or salt on  walking paths during winter.  Clean up any spills in your garage right away. This includes oil or grease spills. What can I do in the bathroom?  Use night lights.  Install grab bars by the toilet and in the tub and shower. Do not use towel bars as grab bars.  Use non-skid mats or decals in the tub or shower.  If you need to sit down in the shower, use a plastic, non-slip stool.  Keep the floor dry. Clean up any water that spills on the floor as soon as it happens.  Remove soap buildup in the tub or shower regularly.  Attach bath mats securely with double-sided non-slip rug tape.  Do not have throw rugs and other things on the floor that can make you trip. What can I do in the bedroom?  Use night lights.  Make sure that you have a light by your bed that is easy to reach.  Do not use any sheets or blankets that are too big for your bed. They should not hang down onto the floor.  Have a firm chair that has side arms. You can use this for support while you get dressed.  Do not have throw rugs and other things on the floor that can make you trip. What can I do in the kitchen?  Clean up any spills right away.  Avoid walking on wet floors.  Keep items that you use a lot in easy-to-reach places.  If you need to reach something above you, use a strong step stool that has a grab bar.  Keep electrical cords out of the way.  Do not use floor polish or wax that makes floors slippery. If you must use wax, use non-skid floor wax.  Do not have throw rugs and other things on the floor that can make you trip. What can I do with my stairs?  Do not leave any items on the stairs.  Make sure that there are handrails on both sides of the stairs and use them. Fix handrails that are broken or loose. Make sure that handrails are as long as the stairways.  Check any carpeting to make sure that it is firmly attached to the stairs. Fix any carpet that is loose or worn.  Avoid having throw  rugs at the top or bottom of the stairs. If you do have throw rugs, attach them to the floor with carpet tape.  Make sure that you have a light switch at the top of the stairs and the bottom of the stairs. If you do not have them, ask someone to add them for you. What else can I do to help prevent falls?  Wear shoes that:  Do not have high heels.  Have rubber bottoms.  Are comfortable and fit you well.  Are closed at the toe. Do not wear sandals.  If you use a stepladder:  Make sure that it is fully opened. Do not climb a closed stepladder.  Make sure that both sides of the stepladder are locked into place.  Ask someone to hold it for you, if possible.  Clearly mark and make sure that you can see:  Any grab bars or handrails.  First and last steps.  Where the edge of each step is.  Use tools that help you move around (mobility aids) if they are needed. These include:  Canes.  Walkers.  Scooters.  Crutches.  Turn on the lights when you go into a dark area. Replace any light bulbs as soon as they burn out.  Set up your furniture so you have a clear path. Avoid moving your furniture around.  If any of your floors are uneven, fix them.  If there are any pets around you, be aware of where they are.  Review your medicines with your doctor. Some medicines can make you feel dizzy. This can increase your chance of falling. Ask your doctor what other things that you can do to help prevent falls. This information is not intended to replace advice given to you by your health care provider. Make sure you discuss any questions you have with your health care provider. Document Released: 04/23/2009 Document Revised: 12/03/2015 Document Reviewed: 08/01/2014 Elsevier Interactive Patient Education  2017 [place] Maintenance, Male A healthy lifestyle and preventive care is important for your health and wellness. Ask your health care provider about what schedule of  regular examinations is right for you. What should I know about weight and diet?  Eat a Healthy Diet  Eat plenty of vegetables, fruits, whole grains, low-fat dairy products, and lean protein.  Do not eat a lot of foods high in solid fats, added sugars,  or salt. Maintain a Healthy Weight  Regular exercise can help you achieve or maintain a healthy weight. You should:  Do at least 150 minutes of exercise each week. The exercise should increase your heart rate and make you sweat (moderate-intensity exercise).  Do strength-training exercises at least twice a week. Watch Your Levels of Cholesterol and Blood Lipids  Have your blood tested for lipids and cholesterol every 5 years starting at 81 years of age. If you are at high risk for heart disease, you should start having your blood tested when you are 81 years old. You may need to have your cholesterol levels checked more often if:  Your lipid or cholesterol levels are high.  You are older than 81 years of age.  You are at high risk for heart disease. What should I know about cancer screening? Many types of cancers can be detected early and may often be prevented. Lung Cancer  You should be screened every year for lung cancer if:  You are a current smoker who has smoked for at least 30 years.  You are a former smoker who has quit within the past 15 years.  Talk to your health care provider about your screening options, when you should start screening, and how often you should be screened. Colorectal Cancer  Routine colorectal cancer screening usually begins at 81 years of age and should be repeated every 5-10 years until you are 81 years old. You may need to be screened more often if early forms of precancerous polyps or small growths are found. Your health care provider may recommend screening at an earlier age if you have risk factors for colon cancer.  Your health care provider may recommend using home test kits to check for  hidden blood in the stool.  A small camera at the end of a tube can be used to examine your colon (sigmoidoscopy or colonoscopy). This checks for the earliest forms of colorectal cancer. Prostate and Testicular Cancer  Depending on your age and overall health, your health care provider may do certain tests to screen for prostate and testicular cancer.  Talk to your health care provider about any symptoms or concerns you have about testicular or prostate cancer. Skin Cancer  Check your skin from head to toe regularly.  Tell your health care provider about any new moles or changes in moles, especially if:  There is a change in a mole's size, shape, or color.  You have a mole that is larger than a pencil eraser.  Always use sunscreen. Apply sunscreen liberally and repeat throughout the day.  Protect yourself by wearing long sleeves, pants, a wide-brimmed hat, and sunglasses when outside. What should I know about heart disease, diabetes, and high blood pressure?  If you are 35-59 years of age, have your blood pressure checked every 3-5 years. If you are 31 years of age or older, have your blood pressure checked every year. You should have your blood pressure measured twice-once when you are at a hospital or clinic, and once when you are not at a hospital or clinic. Record the average of the two measurements. To check your blood pressure when you are not at a hospital or clinic, you can use:  An automated blood pressure machine at a pharmacy.  A home blood pressure monitor.  Talk to your health care provider about your target blood pressure.  If you are between 18-78 years old, ask your health care provider if you should take aspirin  to prevent heart disease.  Have regular diabetes screenings by checking your fasting blood sugar level.  If you are at a normal weight and have a low risk for diabetes, have this test once every three years after the age of 38.  If you are overweight and  have a high risk for diabetes, consider being tested at a younger age or more often.  A one-time screening for abdominal aortic aneurysm (AAA) by ultrasound is recommended for men aged 36-75 years who are current or former smokers. What should I know about preventing infection? Hepatitis B  If you have a higher risk for hepatitis B, you should be screened for this virus. Talk with your health care provider to find out if you are at risk for hepatitis B infection. Hepatitis C  Blood testing is recommended for:  Everyone born from [birth year] through 1963.  Anyone with known risk factors for hepatitis C. Sexually Transmitted Diseases (STDs)  You should be screened each year for STDs including gonorrhea and chlamydia if:  You are sexually active and are younger than 81 years of age.  You are older than 81 years of age and your health care provider tells you that you are at risk for this type of infection.  Your sexual activity has changed since you were last screened and you are at an increased risk for chlamydia or gonorrhea. Ask your health care provider if you are at risk.  Talk with your health care provider about whether you are at high risk of being infected with HIV. Your health care provider may recommend a prescription medicine to help prevent HIV infection. What else can I do?  Schedule regular health, dental, and eye exams.  Stay current with your vaccines (immunizations).  Do not use any tobacco products, such as cigarettes, chewing tobacco, and e-cigarettes. If you need help quitting, ask your health care provider.  Limit alcohol intake to no more than 2 drinks per day. One drink equals 12 ounces of beer, 5 ounces of wine, or 1 ounces of hard liquor.  Do not use street drugs.  Do not share needles.  Ask your health care provider for help if you need support or information about quitting drugs.  Tell your health care provider if you often feel depressed.  Tell your health  care provider if you have ever been abused or do not feel safe at home. This information is not intended to replace advice given to you by your health care provider. Make sure you discuss any questions you have with your health care provider. Document Released: 12/24/2007 Document Revised: 02/24/2016 Document Reviewed: 03/31/2015 Elsevier Interactive Patient Education  2017 [employer].

## 2016-11-03 NOTE — Telephone Encounter (Signed)
The patient was in for AWV 04/26 States he did was not able to make apt with plastic surgery and wife made several attempts. Was told by the office they did not get the referral .  Call to [facility] for Plastic Surgery. STated fax was sent 01/2016  Confirmed that the patient was Isaiah Davidson; medicare and they do not accept this coverage.  Please advise;

## 2016-11-03 NOTE — Progress Notes (Signed)
Subjective:   Isaiah Davidson is a 83 y.o. male who presents for Medicare Annual/Subsequent preventive examination.  The Patient was informed that the wellness visit is to identify future health risk and educate and initiate measures that can reduce risk for increased disease through the lifespan.    NO ROS; Medicare Wellness Visit Last OV 07/2016 Dr. Yong Channel had recommended a surgeon to check lipoma on face   Describes health as good, fair or great? Good   Preventive Screening -Counseling & Management  Colonoscopy 11/2011   Smoking history - former smoker; quit 1980;30 pack years  Has hx of echo; age post recommendation for AAA   Smokeless tobacco; before smoking  Second Hand Smoke status; No Smokers in the home ETOH - no  Medication adherence or issues? No  RISK FACTORS Diet -  Breakfast; eggs; bacon; oatmeal Sometimes Lunch; sandwich and fruit Supper cook for supper with meat and vegetables Not a sweet eater   Regular exercise  Work in the year; garden  Network engineer, cucumbers, tomatoes  Planted peach tree; works in the yard most days [employer] walking  Cardiac Risk Factors:  Advanced aged > 68 in men; >65 in women Hyperlipidemia - LDl 101; cho 235; HDL 54; trig 234  Does not eat a lot of fat; no fried foods  Diabetes neg Family History -  Obesity neg; BMI neg  Fall risk  Given education on "Fall Prevention in the Home" for more safety tips the patient can apply as appropriate.  Long term goal is to "age in place" or undecided   Mobility of Functional changes this year? Plans to age in place - has one child that lives close to  Home is one level;  Bathtub with shower;  Bars in the shower Both get in the tub   Safety; uses safe; ladder safety; admits to being nervous on a ladder due to the consequences of a fall   Community - good neighbors  wears sunscreen wears a hat; straw hat  safe place for firearms;  Motor vehicle accidents; no  Mental Health:  Any  emotional problems? Anxious, depressed, irritable, sad or blue? No Denies feeling depressed or hopeless; voices pleasure in daily life How many social activities have you been engaged in within the last 2 weeks? no    Activities of Daily Living - See functional screen   Cognitive testing; goes to neurology and will defer memory  Apt with dr. Brett Fairy 05/01  Declines memory test today; not an issues; no seizures activity; started x 83 yo 83 yo when he got lost driving   Advanced Directives yes this is completed per self report  Patient Care Team: Marin Olp, MD as PCP - General (Family Medicine) Dr. Brett Fairy, [place] to go to [facility] and schedule apt for eye check   Immunization History  Administered Date(s) Administered  . Influenza Split 04/19/2011, 04/03/2012  . Influenza Whole 06/05/2007, 05/20/2009  . Influenza,inj,Quad PF,36+ Mos 04/01/2013, 05/26/2015  . Influenza-Unspecified 04/19/2014, 04/27/2016  . Pneumococcal Conjugate-13 07/21/2014  . Pneumococcal Polysaccharide-23 06/18/2002  . Td 01/28/2010   Required Immunizations needed today  Screening test up to date or reviewed for plan of completion There are no preventive care reminders to display for this patient.  Cardiac Risk Factors include: advanced age (>79men, >23 women);dyslipidemia;family history of premature cardiovascular disease;hypertension;male genderno shingles vaccine; Educated regarding the shingrix     Objective:    Vitals: BP 136/60   Pulse 70   Ht  5\' 9"  (1.753 m)   Wt 180 lb (81.6 kg)   SpO2 93%   BMI 26.58 kg/m   Body mass index is 26.58 kg/m.  Tobacco History  Smoking Status  . Former Smoker  . Packs/day: 1.00  . Years: 30.00  . Types: Cigars, Cigarettes  . Quit date: 07/11/1978  Smokeless Tobacco  . Never Used     Counseling given: Yes age and post quit date does not qualify him for further screening   Past Medical History:  Diagnosis Date  . HYPERLIPIDEMIA  02/22/2007  . HYPERTENSION 02/22/2007  . MCI (mild cognitive impairment) with memory loss 05/14/2014  . Seizures ([place])    treated for siezures around 2010   Past Surgical History:  Procedure Laterality Date  . CARPAL TUNNEL RELEASE    . KNEE SURGERY     arthroscopic   Family History  Problem Relation Age of Onset  . Diabetes Paternal Grandmother     raised by grandparents   History  Sexual Activity  . Sexual activity: Not on file    Outpatient Encounter Prescriptions as of 11/03/2016  Medication Sig  . amLODipine (NORVASC) 10 MG tablet Take 1 tablet (10 mg total) by mouth daily.  [name] Kitchen aspirin 81 MG tablet Take 81 mg by mouth daily.    . chlorthalidone (HYGROTON) 25 MG tablet Take 1 tablet (25 mg total) by mouth daily.  [name] Kitchen donepezil (ARICEPT) 5 MG tablet Take 1 tablet (5 mg total) by mouth 2 (two) times daily.  [name] Kitchen levETIRAcetam (KEPPRA) 500 MG tablet Take 1 tablet (500 mg total) by mouth 2 (two) times daily.  . Multiple Vitamin (MULTIVITAMIN) capsule Take 1 capsule by mouth daily.    . simvastatin (ZOCOR) 40 MG tablet Take 1 tablet (40 mg total) by mouth daily.  . Febuxostat 80 MG TABS Take 1 tablet (80 mg total) by mouth daily. (Patient not taking: Reported on 11/03/2016)   Facility-Administered Encounter Medications as of 11/03/2016  Medication  . ipratropium-albuterol (DUONEB) 0.5-2.5 (3) MG/3ML nebulizer solution 3 mL    Activities of Daily Living In your present state of health, do you have any difficulty performing the following activities: 11/03/2016  Hearing? N  Vision? N  Difficulty concentrating or making decisions? N  Walking or climbing stairs? N  Dressing or bathing? N  Doing errands, shopping? N  Preparing Food and eating ? N  Using the Toilet? N  In the past six months, have you accidently leaked urine? N  Do you have problems with loss of bowel control? N  Managing your Medications? N  Managing your Finances? N  Housekeeping or managing your Housekeeping? N    Some recent data might be hidden    Patient Care Team: Marin Olp, MD as PCP - General (Family Medicine)   Assessment:     Exercise Activities and Dietary recommendations Current Exercise Habits: Home exercise routine, Type of exercise: walking;strength training/weights, Time (Minutes): 45, Frequency (Times/Week): 4, Weekly Exercise (Minutes/Week): 180, Intensity: Mild  Goals    . patient          Would like to have to part time job;  If not; will do a little more yard work       Fall Risk Fall Risk  11/03/2016 02/03/2016 07/21/2014 04/01/2013  Falls in the past year? No No No No   Depression Screen PHQ 2/9 Scores 11/03/2016 02/03/2016 07/21/2014 04/01/2013  PHQ - 2 Score 0 0 0 0    Cognitive Function MMSE - Mini  Mental State Exam 11/03/2016 11/10/2015 05/14/2015  Not completed: (No Data) - -  Orientation to time - 4 1  Orientation to Place - 5 4  Registration - 3 3  Attention/ Calculation - 1 5  Recall - 3 2  Language- name 2 objects - 2 2  Language- repeat - 1 1  Language- follow 3 step command - 3 3  Language- read & follow direction - 1 1  Write a sentence - 1 1  Copy design - 1 0  Total score - 25 23   Montreal Cognitive Assessment  05/14/2015 05/14/2014  Visuospatial/ Executive (0/5) 3 4  Naming (0/3) 2 3  Attention: Read list of digits (0/2) 2 1  Attention: Read list of letters (0/1) 1 1  Attention: Serial 7 subtraction starting at 100 (0/3) 1 2  Language: Repeat phrase (0/2) 1 2  Language : Fluency (0/1) 1 0  Abstraction (0/2) 1 2  Delayed Recall (0/5) 0 2  Orientation (0/6) 5 6  Total 17 23  Adjusted Score (based on education) 18 23      Immunization History  Administered Date(s) Administered  . Influenza Split 04/19/2011, 04/03/2012  . Influenza Whole 06/05/2007, 05/20/2009  . Influenza,inj,Quad PF,36+ Mos 04/01/2013, 05/26/2015  . Influenza-Unspecified 04/19/2014, 04/27/2016  . Pneumococcal Conjugate-13 07/21/2014  . Pneumococcal  Polysaccharide-23 06/18/2002  . Td 01/28/2010   Screening Tests Health Maintenance  Topic Date Due  . INFLUENZA VACCINE  02/08/2017  . TETANUS/TDAP  01/29/2020  . PNA vac Low Risk Adult  Completed      Plan:      PCP Notes  Health Maintenance Request deferment of cognitive testing to Dr. Brett Fairy in May   Abnormal Screens  To have eye exam and will schedule  Referrals states he never was scheduled with plastic surgeon  Will fup today   Patient concerns; fup regarding lipoma;  Plastic surgeon states they did not get referral   Nurse Concerns; none at this time; good historian;    Next PCP apt apt 07/26 with Dr. Yong Channel;  Managing independently; still drives; no accidents     During the course of the visit the patient was educated and counseled about the following appropriate screening and preventive services:   Vaccines to include Pneumoccal, Influenza, Hepatitis B, Td, Zostavax, HCV  Electrocardiogram  Cardiovascular Disease  Colorectal cancer screening  Diabetes screening  Prostate Cancer Screening  Glaucoma screening  Nutrition counseling   Smoking cessation counseling  Patient Instructions (the written plan) was given to the patient.    Wynetta Fines, RN  11/03/2016

## 2016-11-03 NOTE — Telephone Encounter (Signed)
Can you check with [name] to see if there are any that do accept his insurance? If not we could trial referral to dermatology to see if they would do it or had recommendations for who could remove this lipoma near his eye.

## 2016-11-04 NOTE — Telephone Encounter (Signed)
Isaiah Davidson can you call to give them the # for plastic surgery that their insurance accepts. I would enter a referral for plastic surgery under lipoma with the information so at least its in the system if insurance inquires or requires.

## 2016-11-04 NOTE — Telephone Encounter (Signed)
Call back from Ms. Lovena Le. [employer] uses [facility] You can refer to Dr. Dema Severin or Dr. Melissa Montane Address: [phone]  Do you want me to call or other?   [name] states we do not make plastic surgery referrals?  Manuela Schwartz

## 2016-11-04 NOTE — Telephone Encounter (Signed)
Call to Ms. Isaiah Davidson after speaking with [name]. Requested she call [employer] and see what plastic surgeon is in their network  Please let the office know and we will refer   She will fup

## 2016-11-08 ENCOUNTER — Encounter: Payer: Self-pay | Admitting: Neurology

## 2016-11-08 ENCOUNTER — Telehealth: Payer: Self-pay

## 2016-11-08 ENCOUNTER — Other Ambulatory Visit: Payer: Self-pay

## 2016-11-08 ENCOUNTER — Ambulatory Visit (INDEPENDENT_AMBULATORY_CARE_PROVIDER_SITE_OTHER): Payer: Medicare HMO | Admitting: Neurology

## 2016-11-08 VITALS — BP 136/76 | HR 82 | Ht 69.0 in | Wt 181.0 lb

## 2016-11-08 DIAGNOSIS — D179 Benign lipomatous neoplasm, unspecified: Secondary | ICD-10-CM

## 2016-11-08 DIAGNOSIS — G3184 Mild cognitive impairment, so stated: Secondary | ICD-10-CM

## 2016-11-08 MED ORDER — LEVETIRACETAM 500 MG PO TABS: 500.0000 mg | ORAL_TABLET | Freq: Two times a day (BID) | ORAL | 0 refills | Status: DC

## 2016-11-08 MED ORDER — DONEPEZIL HCL 5 MG PO TABS: 5.0000 mg | ORAL_TABLET | Freq: Two times a day (BID) | ORAL | 3 refills | Status: DC

## 2016-11-08 NOTE — Telephone Encounter (Signed)
Order entered for plastic surgery referral.

## 2016-11-08 NOTE — Progress Notes (Signed)
PATIENT: Isaiah Davidson DOB: [birth year]  REASON FOR VISIT: follow up- seizures, memory loss HISTORY FROM: patient  HISTORY OF PRESENT ILLNESS: 11-08-2016;  Isaiah Davidson is an 83 year old male with a history of seizures and memory loss. He returns for memory testing.  Over the last year , since last see by Isaiah Givens, NP , he has felt stabile.He continues to take Keppra and Aricept. He reports that he has not had any additional seizure events. He continues to operate a motor vehicle without difficulty. The patient feels that his memory has remained the same. He is able to complete all ADLs independently.  He lives at home with his wife, who is physically fit as well. [name] Kitchen He denies having to give up anything due to his memory.   Denies any hallucinations or vivid dreams. Denies again any changes with his sleep, gait or balance.     05/14/15: Isaiah Davidson is an 83 year old male with a history of seizures and memory loss. He returns today for follow-up. He is currently taking Keppra and Aricept. He reports that he has not had any seizure events. He is able to complete all ADLs independently. He operates a Teacher, music without difficulty. The patient feels that he does not have any issues with his memory. His wife is with him today at the visit and she's not noticed any significant changes. He states that he sleeps well at night. Denies any agitation or aggressiveness. Denies any new medical issues. Patient states that since he is retired he has no need to remember the date so therefore that why he does not score good on her memory test. He returns today for follow-up.   HISTORY 05/14/14 (Isaiah Davidson): Isaiah Davidson, A married, right handed 70 -year-old african [place] male, presents for followup for memory loss and seizure control. [name] Kitchen He was last seen by Isaiah Rubin, NP on 05-13-13. A head CT in past showed established white matter disease. He has COPD, and high cholersterol.  No seizure  activity in over 4 years. Hx of Carpal Tunnel Surgery. He has tolerated Aricept 5mg  bid , after presenting with GI side effects to 10mg  dose.  Memory per [facility] 05-14-14 was 24-30 points .  No increased confusion or behavior issues according to wife.  Remains independent in ADL's. Minimal exercise but does "keep up the yard", his appetite is reportedly good and he is sleeping well at night. His wife has always balanced the cheques and he has always done the maintenance on cars, garden and Appliances. He still does a "little plumming".  HISTORY: 05-18-12 Dr. Brett [place] Rv for this established mild cognitive impairmed patient, who scores today 23-30 on MOCA and did score 29/30 on MMSE 6 month ago. he has had a single prolonged seizure 2011, no recurrence on Keppra. EEG reviewed.patient doing well on 5 mg daily aricept, but should try 5 mg bid po  REVIEW OF SYSTEMS: Out of a complete 14 system review of symptoms, the patient complains only of the following symptoms, and all other reviewed systems are negative.  See history of present illness. Memory loss. No recurrent seizures since 2011.   ALLERGIES: No Known Allergies  HOME MEDICATIONS: Outpatient Medications Prior to Visit  Medication Sig Dispense Refill  . amLODipine (NORVASC) 10 MG tablet Take 1 tablet (10 mg total) by mouth daily. 90 tablet 3  . aspirin 81 MG tablet Take 81 mg by mouth daily.      . chlorthalidone (HYGROTON) 25 MG tablet Take 1 tablet (  25 mg total) by mouth daily. 90 tablet 3  . donepezil (ARICEPT) 5 MG tablet Take 1 tablet (5 mg total) by mouth 2 (two) times daily. 180 tablet 3  . Febuxostat 80 MG TABS Take 1 tablet (80 mg total) by mouth daily. 30 tablet 3  . levETIRAcetam (KEPPRA) 500 MG tablet Take 1 tablet (500 mg total) by mouth 2 (two) times daily. 60 tablet 0  . Multiple Vitamin (MULTIVITAMIN) capsule Take 1 capsule by mouth daily.      . simvastatin (ZOCOR) 40 MG tablet Take 1 tablet (40 mg total) by  mouth daily. 90 tablet 3   Facility-Administered Medications Prior to Visit  Medication Dose Route Frequency Provider Last Rate Last Dose  . ipratropium-albuterol (DUONEB) 0.5-2.5 (3) MG/3ML nebulizer solution 3 mL  3 mL Nebulization Once Dorothyann Peng, NP        PAST MEDICAL HISTORY: Past Medical History:  Diagnosis Date  . HYPERLIPIDEMIA 02/22/2007  . HYPERTENSION 02/22/2007  . MCI (mild cognitive impairment) with memory loss 05/14/2014  . Seizures ([place])    treated for siezures around 2010    PAST SURGICAL HISTORY: Past Surgical History:  Procedure Laterality Date  . CARPAL TUNNEL RELEASE    . KNEE SURGERY     arthroscopic    FAMILY HISTORY: Family History  Problem Relation Age of Onset  . Diabetes Paternal Grandmother     raised by grandparents    SOCIAL HISTORY: Social History   Social History  . Marital status: Married    Spouse name: [name]  . Number of children: 2  . Years of education: 10   Occupational History  .  Retired   Social History Main Topics  . Smoking status: Former Smoker    Packs/day: 1.00    Years: 30.00    Types: Cigars, Cigarettes    Quit date: 07/11/1978  . Smokeless tobacco: Never Used  . Alcohol use No  . Drug use: No  . Sexual activity: Not on file   Other Topics Concern  . Not on file   Social History Narrative   Patient is married ([name]).   Patient has 3 children. 2 grandkids. 3 greatgrandkids.    Patient is retired Administrator.    Patient is right-handed.   Patient has a 10th grade education.   Patient drinks one cup of coffee daily.      Hobbies: working in the yard- gardening competition with neighbor on who can grow best vegetables, "messing around the house"      07/21/14- wants wife to make decisions for him. Not sure if he has advanced directives.             PHYSICAL EXAM  Vitals:   11/08/16 1053  BP: 136/76  Pulse: 82  Weight: 181 lb (82.1 kg)  Height: 5\' 9"  (1.753 m)   Body mass index is 26.73  kg/m.   MMSE - Mini Mental State Exam 11/08/2016 11/03/2016 11/10/2015  Not completed: - (No Data) -  Orientation to time 4 - 4  Orientation to Place 5 - 5  Registration 3 - 3  Attention/ Calculation 1 - 1  Attention/Calculation-comments counted back by 10s even though he was reminded to subtract 7 - -  Recall 2 - 3  Language- name 2 objects 2 - 2  Language- repeat 1 - 1  Language- follow 3 step command 3 - 3  Language- read & follow direction 1 - 1  Write a sentence 0 - 1  Copy  design 0 - 1  Total score 22 - 25    Generalized: Well developed, in no acute distress   Neurological examination  Mentation: Alert oriented to time, place, history taking. Follows all commands speech and language fluent Cranial nerve : food tastes good, smell sense is unchanged.   Pupils were equal round reactive to light. Extraocular movements were full, visual field were full on confrontational test. Facial sensation and strength were normal. Uvula tongue midline. Head turning and shoulder shrug  were normal and symmetric. Motor:  5 / strength of all 4 extremities.no rigor and  symmetric motor tone is noted throughout.  Sensory: Sensory testing is intact to soft touch on all 4 extremities. Coordination:  finger-nose-finger and heel-to-shin bilaterally intact .  Gait and station: Gait is normal. Tandem gait is normal. Romberg is negative. No drift is seen.  Reflexes: Deep tendon reflexes are symmetric  bilaterally.   DIAGNOSTIC DATA (LABS, IMAGING, TESTING) - I reviewed patient records, labs, notes, testing and imaging myself where available.  ASSESSMENT AND PLAN 83 y.o. year old male  has a past medical history of HYPERLIPIDEMIA (02/22/2007); HYPERTENSION (02/22/2007); MCI (mild cognitive impairment) with memory loss (05/14/2014); and Seizures ([place]). here with:  1. Mr. Laforge suffered one single seizure in 2011 and has been controlled on Keppra ever since. My question is if he should wean him off seizure  medication as he has been 7 years seizure-free and may not need continuous medication. 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 2. Memory loss, the Mini-Mental Status Examination score 22 out of 30 points please note that his short-term memory was actually quite intact, the problems with writing and calculation, and these can be educationally explained. The patient attended school until 9 th grade, raised by share cropper grandfather.   Overall the patient is doing well. The patient will remain on Keppra 500 mg twice a day. His MMSE has remained stable. He will also remain on Aricept 10 mg daily- he does take this in a divided dose. Patient is advised that if his symptoms worsen or he develops any new symptoms he will let us know. He will follow-up in one year or sooner if needed.   Larey Seat, MD   11/08/2016, 11:07 AM [facility] [address] [place], [place] [address] [phone]

## 2016-11-08 NOTE — Telephone Encounter (Signed)
I completed pa for donepezil 5mg  BID via phone with [employer]. Should have a determination in 1-3 business days.

## 2016-11-08 NOTE — Telephone Encounter (Signed)
Tried to call patient but line is busy. I will call again

## 2016-11-09 NOTE — Telephone Encounter (Signed)
[employer] approved the pa for donepezil until 11/09/2017. I called pt and spoke to pt's wife, [name], (per DPR) advising her of this information. A refill on the donepezil was sent to [facility] yesterday. Pt's wife verbalized understanding.

## 2016-11-09 NOTE — Telephone Encounter (Signed)
Spoke with Bryon Lions this morning who is the referral coordinator. She is working on the referral.

## 2017-01-16 ENCOUNTER — Telehealth: Payer: Self-pay | Admitting: Neurology

## 2017-01-16 MED ORDER — LEVETIRACETAM 500 MG PO TABS: 500.0000 mg | ORAL_TABLET | Freq: Two times a day (BID) | ORAL | 0 refills | Status: DC

## 2017-01-16 MED ORDER — DONEPEZIL HCL 5 MG PO TABS: 5.0000 mg | ORAL_TABLET | Freq: Two times a day (BID) | ORAL | 3 refills | Status: DC

## 2017-01-16 NOTE — Telephone Encounter (Signed)
Patient's wife requesting levETIRAcetam (KEPPRA) 500 MG tablet#30 called to [facility] on [address] and she also wants Rx sent to [facility]. She also wants donepezil (ARICEPT) 5 MG tablet called to [facility].

## 2017-01-16 NOTE — Addendum Note (Signed)
Addended by: Darleen Crocker on: 01/16/2017 03:37 PM   Modules accepted: Orders

## 2017-01-16 NOTE — Telephone Encounter (Signed)
Spoke with wife and made her aware that these medications were taken care of. She stated that [facility] said they did not get scripts for Keppra and Aricept. I reassured I would resend them. I was able to send a 30 day supply to [facility] and then the rest to [facility].

## 2017-01-17 ENCOUNTER — Other Ambulatory Visit: Payer: Self-pay | Admitting: *Deleted

## 2017-01-17 MED ORDER — DONEPEZIL HCL 5 MG PO TABS: 5.0000 mg | ORAL_TABLET | Freq: Two times a day (BID) | ORAL | 0 refills | Status: DC

## 2017-01-17 MED ORDER — LEVETIRACETAM 500 MG PO TABS: 500.0000 mg | ORAL_TABLET | Freq: Two times a day (BID) | ORAL | 0 refills | Status: DC

## 2017-01-17 NOTE — Telephone Encounter (Signed)
Local pharmacy updated in patient's chart and 30-day supply sent.  Pt's wife is aware that this has been handled.

## 2017-01-17 NOTE — Telephone Encounter (Signed)
Patients wife called office in reference to Isaiah Davidson not having prescriptions for Keppra or Aricept.  Patient is out of both medications.

## 2017-02-02 ENCOUNTER — Ambulatory Visit (INDEPENDENT_AMBULATORY_CARE_PROVIDER_SITE_OTHER): Payer: Commercial Managed Care - HMO | Admitting: Family Medicine

## 2017-02-02 ENCOUNTER — Encounter: Payer: Self-pay | Admitting: Family Medicine

## 2017-02-02 VITALS — BP 132/76 | HR 71 | Temp 98.3°F | Ht 69.0 in | Wt 179.6 lb

## 2017-02-02 DIAGNOSIS — M119 Crystal arthropathy, unspecified: Secondary | ICD-10-CM | POA: Diagnosis not present

## 2017-02-02 DIAGNOSIS — G40909 Epilepsy, unspecified, not intractable, without status epilepticus: Secondary | ICD-10-CM

## 2017-02-02 DIAGNOSIS — I10 Essential (primary) hypertension: Secondary | ICD-10-CM | POA: Diagnosis not present

## 2017-02-02 DIAGNOSIS — D170 Benign lipomatous neoplasm of skin and subcutaneous tissue of head, face and neck: Secondary | ICD-10-CM | POA: Diagnosis not present

## 2017-02-02 DIAGNOSIS — R0602 Shortness of breath: Secondary | ICD-10-CM

## 2017-02-02 DIAGNOSIS — E785 Hyperlipidemia, unspecified: Secondary | ICD-10-CM

## 2017-02-02 DIAGNOSIS — D179 Benign lipomatous neoplasm, unspecified: Secondary | ICD-10-CM | POA: Insufficient documentation

## 2017-02-02 LAB — CBC
HCT: 41.7 % (ref 39.0–52.0)
Hemoglobin: 14.3 g/dL (ref 13.0–17.0)
MCHC: 34.2 g/dL (ref 30.0–36.0)
MCV: 86.8 fl (ref 78.0–100.0)
Platelets: 307 10*3/uL (ref 150.0–400.0)
RBC: 4.8 Mil/uL (ref 4.22–5.81)
RDW: 13 % (ref 11.5–15.5)
WBC: 6.2 10*3/uL (ref 4.0–10.5)

## 2017-02-02 LAB — COMPREHENSIVE METABOLIC PANEL
ALT: 21 U/L (ref 0–53)
AST: 25 U/L (ref 0–37)
Albumin: 4.3 g/dL (ref 3.5–5.2)
Alkaline Phosphatase: 83 U/L (ref 39–117)
BUN: 14 mg/dL (ref 6–23)
CHLORIDE: 99 meq/L (ref 96–112)
CO2: 30 meq/L (ref 19–32)
CREATININE: 1.44 mg/dL (ref 0.40–1.50)
Calcium: 10.1 mg/dL (ref 8.4–10.5)
GFR: 60.23 mL/min (ref >60.00)
Glucose, Bld: 93 mg/dL (ref 70–99)
Potassium: 4.1 mEq/L (ref 3.5–5.1)
SODIUM: 138 meq/L (ref 135–145)
Total Bilirubin: 0.7 mg/dL (ref 0.2–1.2)
Total Protein: 7.7 g/dL (ref 6.0–8.3)

## 2017-02-02 LAB — LIPID PANEL
CHOL/HDL RATIO: 4
Cholesterol: 224 mg/dL — ABNORMAL HIGH (ref 0–200)
HDL: 50.7 mg/dL (ref >39.00)
NONHDL: 173.45
Triglycerides: 297 mg/dL — ABNORMAL HIGH (ref 0.0–149.0)
VLDL: 59.4 mg/dL — ABNORMAL HIGH (ref 0.0–40.0)

## 2017-02-02 LAB — URIC ACID: URIC ACID, SERUM: 8.6 mg/dL — AB (ref 4.0–7.8)

## 2017-02-02 LAB — LDL CHOLESTEROL, DIRECT: Direct LDL: 77 mg/dL

## 2017-02-02 NOTE — Assessment & Plan Note (Signed)
S: mild poorly controlled on simvastatin 40mg . No myalgias.  Lab Results  Component Value Date   CHOL 235 (H) 02/03/2016   HDL 54.30 02/03/2016   LDLCALC 93 09/24/2013   LDLDIRECT 101.0 02/03/2016   TRIG 234.0 (H) 02/03/2016   CHOLHDL 4 02/03/2016   A/P: discussed trial of atorvastatin 20mg (due to simvastatin limits and to see if more effective) if lipids remain elevated with LDL >100

## 2017-02-02 NOTE — Progress Notes (Signed)
Subjective:  Isaiah Davidson is a 83 y.o. year old very pleasant male patient who presents for/with See problem oriented charting ROS- No chest pain.  shortness of breath with activity mildly improved. No headache or blurry vision.    Past Medical History-  Patient Active Problem List   Diagnosis Date Noted  . MCI (mild cognitive impairment) with memory loss 05/14/2014    Priority: High  . Seizure disorder ([place]) 04/19/2011    Priority: High  . Aortic atherosclerosis ([place]) 08/05/2016    Priority: Medium  . 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 Hyperlipidemia 02/22/2007    Priority: Medium  . Essential hypertension 02/22/2007    Priority: Medium  . Finger infection 06/11/2014    Priority: Low  . Shortness of breath 10/12/2012    Priority: Low  . Carpal tunnel syndrome 08/18/2010    Priority: Low  . Cyst and pseudocyst of pancreas 04/12/2010    Priority: Low  . Lipoma 02/02/2017    Medications- reviewed and updated Current Outpatient Prescriptions  Medication Sig Dispense Refill  . amLODipine (NORVASC) 10 MG tablet Take 1 tablet (10 mg total) by mouth daily. 90 tablet 3  . aspirin 81 MG tablet Take 81 mg by mouth daily.      . chlorthalidone (HYGROTON) 25 MG tablet Take 1 tablet (25 mg total) by mouth daily. 90 tablet 3  . donepezil (ARICEPT) 5 MG tablet Take 1 tablet (5 mg total) by mouth 2 (two) times daily. 60 tablet 0  . Febuxostat 80 MG TABS Take 1 tablet (80 mg total) by mouth daily. 30 tablet 3  . levETIRAcetam (KEPPRA) 500 MG tablet Take 1 tablet (500 mg total) by mouth 2 (two) times daily. 180 tablet 0  . Multiple Vitamin (MULTIVITAMIN) capsule Take 1 capsule by mouth daily.      . simvastatin (ZOCOR) 40 MG tablet Take 1 tablet (40 mg total) by mouth daily. 90 tablet 3   Current Facility-Administered Medications  Medication Dose Route Frequency Provider Last Rate Last Dose  . ipratropium-albuterol (DUONEB) 0.5-2.5 (3) MG/3ML nebulizer solution 3 mL  3  mL Nebulization Once Nafziger, Cory, NP        Objective: BP 132/76 (BP Location: Left Arm, Patient Position: Sitting, Cuff Size: Large)   Pulse 71   Temp 98.3 F (36.8 C) (Oral)   Ht 5\' 9"  (1.753 m)   Wt 179 lb 9.6 oz (81.5 kg)   SpO2 96%   BMI 26.52 kg/m  Gen: NAD, resting comfortably TM normal, oropharynx normal CV: RRR no murmurs rubs or gallops Lungs: CTAB no crackles, wheeze, rhonchi Abdomen: soft/nontender/nondistended/normal bowel sounds. No rebound or guarding.  Ext: no edema Skin: warm, dry, see notes below on lipoma  Assessment/Plan:  Hypertension S: controlled on chlorthalidone 25mg  and amlodipine 10mg .  BP Readings from Last 3 Encounters:  02/02/17 132/76  11/08/16 136/76  11/03/16 136/60  A/P: We discussed blood pressure goal of <140/90. Continue current meds  Seizure disorder S: compliant with keppra. Still following with neurology. No seizures in years A/P: continue current meds. Neurology has not wanted to reduce as he still rides a large riding lawn mower  Hyperlipidemia S: mild poorly controlled on simvastatin 40mg . No myalgias.  Lab Results  Component Value Date   CHOL 235 (H) 02/03/2016   HDL 54.30 02/03/2016   LDLCALC 93 09/24/2013   LDLDIRECT 101.0 02/03/2016   TRIG 234.0 (H) 02/03/2016   CHOLHDL 4 02/03/2016   A/P: discussed trial of  atorvastatin 20mg (due to simvastatin limits and to see if more effective) if lipids remain elevated with LDL >100  Crystal arthropathy S: on uloric has had no gout flares despite high uric acid A/P: continue current medications  Shortness of breath See note 08/05/16. He has tried to walk more and the dyspnea seems to be improving. Has been thought to be deconditioning based on prior workup.   Lipoma O: 1.5 x 1.5 cm lateral to right eye A/P: has been referred several times and we have had insurance issues- we are going to follow up on 11/08/16 referral and I will check in with him 3 weeks to see status. He wants  it removed- seems to have slightly grown outward to him   Return in about 6 months (around 08/05/2017) for physical.  Orders Placed This Encounter  Procedures  . CBC    [place]  . Comprehensive metabolic panel    [place]    Order Specific Question:   Has the patient fasted?    Answer:   No  . Lipid panel        Order Specific Question:   Has the patient fasted?    Answer:   No  . Uric Acid   Return precautions advised.  [name], MD

## 2017-02-02 NOTE — Assessment & Plan Note (Signed)
S: on uloric has had no gout flares despite high uric acid A/P: continue current medications

## 2017-02-02 NOTE — Patient Instructions (Addendum)
We are going to follow up about the referral for the spot beside your eye- if you have not heard in 3 weeks call us as my plan is to touch base with you in 3 weeks  Please stop by lab before you go

## 2017-02-02 NOTE — Assessment & Plan Note (Addendum)
O: 1.5 x 1.5 cm lateral to right eye A/P: has been referred several times and we have had insurance issues- we are going to follow up on 11/08/16 referral and I will check in with him 3 weeks to see status. He wants it removed- seems to have slightly grown outward to him

## 2017-02-02 NOTE — Assessment & Plan Note (Signed)
See note 08/05/16. He has tried to walk more and the dyspnea seems to be improving. Has been thought to be deconditioning based on prior workup.

## 2017-03-24 ENCOUNTER — Telehealth: Payer: Self-pay | Admitting: Neurology

## 2017-03-24 MED ORDER — DONEPEZIL HCL 5 MG PO TABS: 5.0000 mg | ORAL_TABLET | Freq: Two times a day (BID) | ORAL | 3 refills | Status: DC

## 2017-03-24 NOTE — Telephone Encounter (Signed)
The wife called for a refill on aricept today, a Rx was sent in to the mail order pharmacy.

## 2017-05-16 ENCOUNTER — Encounter: Payer: Self-pay | Admitting: Adult Health

## 2017-05-16 ENCOUNTER — Ambulatory Visit: Payer: Medicare HMO | Admitting: Adult Health

## 2017-05-16 ENCOUNTER — Encounter (INDEPENDENT_AMBULATORY_CARE_PROVIDER_SITE_OTHER): Payer: Self-pay

## 2017-05-16 VITALS — BP 128/68 | HR 74 | Wt 179.8 lb

## 2017-05-16 DIAGNOSIS — R413 Other amnesia: Secondary | ICD-10-CM | POA: Diagnosis not present

## 2017-05-16 DIAGNOSIS — R569 Unspecified convulsions: Secondary | ICD-10-CM | POA: Diagnosis not present

## 2017-05-16 MED ORDER — DONEPEZIL HCL 5 MG PO TABS: 5.0000 mg | ORAL_TABLET | Freq: Two times a day (BID) | ORAL | 3 refills | Status: DC

## 2017-05-16 MED ORDER — LEVETIRACETAM 500 MG PO TABS: 500.0000 mg | ORAL_TABLET | Freq: Two times a day (BID) | ORAL | 3 refills | Status: DC

## 2017-05-16 NOTE — Progress Notes (Signed)
PATIENT: Isaiah Davidson DOB: 08/30/1931  REASON FOR VISIT: follow up-memory disturbance HISTORY FROM: patient  HISTORY OF PRESENT ILLNESS: Today 05/17/17  Isaiah Davidson is an 83 year old male with a history of memory disturbance and seizures.  He returns today for follow-up.  He reports overall he is doing well.  He continues on Keppra.  He denies any seizure activity.  He feels that his memory has remained stable.  He is able to complete all ADLs independently.  Denies any trouble sleeping.  Denies any changes in his mood or behavior.  He does assist with his own finances.  He remains on Aricept 10 mg daily.  He returns today for an evaluation.  HISTORY 11/08/16 ( dohmeier):Isaiah Davidson is an 83 year old male with a history of seizures and memory loss. He returns for memory testing.  Over the last year , since last see by Ward Givens, NP , he has felt stabile.He continues to take Keppra and Aricept. He reports that he has not had any additional seizure events. He continues to operate a motor vehicle without difficulty. The patient feels that his memory has remained the same. He is able to complete all ADLs independently.  He lives at home with his wife, who is physically fit as well. [name] Kitchen He denies having to give up anything due to his memory.   Denies any hallucinations or vivid dreams. Denies again any changes with his sleep, gait or balance.    REVIEW OF SYSTEMS: Out of a complete 14 system review of symptoms, the patient complains only of the following symptoms, and all other reviewed systems are negative.  See HPI  ALLERGIES: No Known Allergies  HOME MEDICATIONS: Outpatient Medications Prior to Visit  Medication Sig Dispense Refill  . amLODipine (NORVASC) 10 MG tablet Take 1 tablet (10 mg total) by mouth daily. 90 tablet 3  . aspirin 81 MG tablet Take 81 mg by mouth daily.      . chlorthalidone (HYGROTON) 25 MG tablet Take 1 tablet (25 mg total) by mouth daily. 90 tablet 3  .  donepezil (ARICEPT) 5 MG tablet Take 1 tablet (5 mg total) by mouth 2 (two) times daily. 180 tablet 3  . Febuxostat 80 MG TABS Take 1 tablet (80 mg total) by mouth daily. 30 tablet 3  . levETIRAcetam (KEPPRA) 500 MG tablet Take 1 tablet (500 mg total) by mouth 2 (two) times daily. 180 tablet 0  . Multiple Vitamin (MULTIVITAMIN) capsule Take 1 capsule by mouth daily.      . simvastatin (ZOCOR) 40 MG tablet Take 1 tablet (40 mg total) by mouth daily. 90 tablet 3   Facility-Administered Medications Prior to Visit  Medication Dose Route Frequency Provider Last Rate Last Dose  . ipratropium-albuterol (DUONEB) 0.5-2.5 (3) MG/3ML nebulizer solution 3 mL  3 mL Nebulization Once Dorothyann Peng, NP        PAST MEDICAL HISTORY: Past Medical History:  Diagnosis Date  . HYPERLIPIDEMIA 02/22/2007  . HYPERTENSION 02/22/2007  . MCI (mild cognitive impairment) with memory loss 05/14/2014  . Seizures ([place])    treated for siezures around 2010    PAST SURGICAL HISTORY: Past Surgical History:  Procedure Laterality Date  . CARPAL TUNNEL RELEASE    . KNEE SURGERY     arthroscopic    FAMILY HISTORY: Family History  Problem Relation Age of Onset  . Diabetes Paternal Grandmother        raised by grandparents    SOCIAL HISTORY: Social History  Socioeconomic History  . Marital status: Married    Spouse name: [name]  . Number of children: 2  . Years of education: 10  . Highest education level: Not on file  Social Needs  . Financial resource strain: Not on file  . Food insecurity - worry: Not on file  . Food insecurity - inability: Not on file  . Transportation needs - medical: Not on file  . Transportation needs - non-medical: Not on file  Occupational History    Employer: RETIRED  Tobacco Use  . Smoking status: Former Smoker    Packs/day: 1.00    Years: 30.00    Pack years: 30.00    Types: Cigars, Cigarettes    Last attempt to quit: 07/11/1978    Years since quitting: 38.8  . Smokeless  tobacco: Never Used  Substance and Sexual Activity  . Alcohol use: No    Alcohol/week: 0.0 oz  . Drug use: No  . Sexual activity: Not on file  Other Topics Concern  . Not on file  Social History Narrative   Patient is married ([name]).   Patient has 3 children. 2 grandkids. 3 greatgrandkids.    Patient is retired Administrator.    Patient is right-handed.   Patient has a 10th grade education.   Patient drinks one cup of coffee daily.      Hobbies: working in the yard- gardening competition with neighbor on who can grow best vegetables, "messing around the house"      07/21/14- wants wife to make decisions for him. Not sure if he has advanced directives.          PHYSICAL EXAM  Vitals:   05/16/17 1010  BP: 128/68  Pulse: 74  Weight: 179 lb 12.8 oz (81.6 kg)   Body mass index is 26.55 kg/m.   MMSE - Mini Mental State Exam 05/16/2017 11/08/2016 11/03/2016  Not completed: - - (No Data)  Orientation to time 5 4 -  Orientation to Place 5 5 -  Registration 3 3 -  Attention/ Calculation 1 1 -  Attention/Calculation-comments - counted back by 10s even though he was reminded to subtract 7 -  Recall 2 2 -  Language- name 2 objects 2 2 -  Language- repeat 1 1 -  Language- follow 3 step command 3 3 -  Language- read & follow direction 1 1 -  Write a sentence 1 0 -  Copy design 0 0 -  Total score 24 22 -     Generalized: Well developed, in no acute distress   Neurological examination  Mentation: Alert oriented to time, place, history taking. Follows all commands speech and language fluent Cranial nerve II-XII: Pupils were equal round reactive to light. Extraocular movements were full, visual field were full on confrontational test. Facial sensation and strength were normal. Uvula tongue midline. Head turning and shoulder shrug  were normal and symmetric. Motor: The motor testing reveals 5 over 5 strength of all 4 extremities. Good symmetric motor tone is noted throughout.    Sensory: Sensory testing is intact to soft touch on all 4 extremities. No evidence of extinction is noted.  Coordination: Cerebellar testing reveals good finger-nose-finger and heel-to-shin bilaterally.  Gait and station: Gait is normal. Tandem gait is normal. Romberg is negative. No drift is seen.  Reflexes: Deep tendon reflexes are symmetric and normal bilaterally.   DIAGNOSTIC DATA (LABS, IMAGING, TESTING) - I reviewed patient records, labs, notes, testing and imaging myself where available.  Lab Results  Component Value Date   WBC 6.2 02/02/2017   HGB 14.3 02/02/2017   HCT 41.7 02/02/2017   MCV 86.8 02/02/2017   PLT 307.0 02/02/2017      Component Value Date/Time   NA 138 02/02/2017 0833   K 4.1 02/02/2017 0833   CL 99 02/02/2017 0833   CO2 30 02/02/2017 0833   GLUCOSE 93 02/02/2017 0833   BUN 14 02/02/2017 0833   CREATININE 1.44 02/02/2017 0833   CALCIUM 10.1 02/02/2017 0833   PROT 7.7 02/02/2017 0833   ALBUMIN 4.3 02/02/2017 0833   AST 25 02/02/2017 0833   ALT 21 02/02/2017 0833   ALKPHOS 83 02/02/2017 0833   BILITOT 0.7 02/02/2017 0833   GFRNONAA 77.16 01/28/2010 0809   GFRAA  12/11/2009 0615    >60        The eGFR has been calculated using the MDRD equation. This calculation has not been validated in all clinical situations. eGFR's persistently <60 mL/min signify possible Chronic Kidney Disease.   Lab Results  Component Value Date   CHOL 224 (H) 02/02/2017   HDL 50.70 02/02/2017   LDLCALC 93 09/24/2013   LDLDIRECT 77.0 02/02/2017   TRIG 297.0 (H) 02/02/2017   CHOLHDL 4 02/02/2017     Lab Results  Component Value Date   TSH 1.30 10/01/2012      ASSESSMENT AND PLAN 83 y.o. year old male  has a past medical history of HYPERLIPIDEMIA (02/22/2007), HYPERTENSION (02/22/2007), MCI (mild cognitive impairment) with memory loss (05/14/2014), and Seizures ([place]). here with:   1.  Seizures 2.  Memory disturbance  Overall patient is doing well.  He will  continue on Keppra 500 mg twice a day.  The patient's memory score has remained stable.  We will continue to monitor.  He will remain on Aricept 5 mg twice a day.  Advised that if his symptoms worsen or he develops new symptoms he should let us know.  He will follow-up in 6 months or sooner if needed.   Ward Givens, MSN, NP-C 05/16/2017, 10:18 AM [facility] [address], [place], [place] [address] [phone]

## 2017-05-16 NOTE — Patient Instructions (Signed)
Your Plan:  Continue Aricept for memory Continue Keppra for seizures If your symptoms worsen or you develop new symptoms please let us know.    Thank you for coming to see [place] at [facility]. I hope we have been able to provide you high quality care today.  You may receive a patient satisfaction survey over the next few weeks. We would appreciate your feedback and comments so that we may continue to improve ourselves and the health of our patients.

## 2017-05-17 ENCOUNTER — Encounter: Payer: Self-pay | Admitting: Adult Health

## 2017-05-24 ENCOUNTER — Ambulatory Visit (INDEPENDENT_AMBULATORY_CARE_PROVIDER_SITE_OTHER): Payer: Commercial Managed Care - HMO | Admitting: Family Medicine

## 2017-05-24 ENCOUNTER — Encounter: Payer: Self-pay | Admitting: Family Medicine

## 2017-05-24 VITALS — BP 130/80 | HR 70 | Temp 98.6°F | Ht 69.0 in | Wt 175.0 lb

## 2017-05-24 DIAGNOSIS — R0602 Shortness of breath: Secondary | ICD-10-CM

## 2017-05-24 DIAGNOSIS — K5900 Constipation, unspecified: Secondary | ICD-10-CM | POA: Diagnosis not present

## 2017-05-24 DIAGNOSIS — R0609 Other forms of dyspnea: Secondary | ICD-10-CM

## 2017-05-24 DIAGNOSIS — Z23 Encounter for immunization: Secondary | ICD-10-CM

## 2017-05-24 NOTE — Progress Notes (Signed)
    Subjective:  Isaiah Davidson is a 83 y.o. male who presents today with a chief complaint of abdominal pain.   HPI:  Abdominal Pain, Acute Issue Started about a week ago. Pain has been stable over that time. He has been having some constipation. Pain is located in lower abdomen. He took a laxative yesterday. Had a hard bowel movement yesterday. Pain has improved since his bowel movement.  No nausea or vomiting. No fevers or chills.   Cough, Acute Issue Started about a month ago. Started with some cold-like symptoms. He has chronic shortness of breath for the last several years it has worsened over the last few weeks. Cough is non productive. No LE swelling.  Only has shortness of breath with exertion.  No chest pain.  No orthopnea.  No fevers or chills.  No recent sick contacts.  ROS: Per HPI  PMH: Smoking history reviewed. Former smoker.   Objective:  Physical Exam: BP 130/80   Pulse 70   Temp 98.6 F (37 C) (Oral)   Ht 5\' 9"  (1.753 m)   Wt 175 lb (79.4 kg)   SpO2 96%   BMI 25.84 kg/m   Gen: NAD, resting comfortably CV: Regular rate and rhythm.  2 out of 6 systolic murmur noted. Pulm: NWOB, CTAB with no crackles, wheezes, or rhonchi GI: Normal bowel sounds present. Soft, Nontender, Nondistended. MSK: No edema, cyanosis, or clubbing noted Skin: Warm, dry   Assessment/Plan:  Shortness of breath Chronic problem with mild exacerbation today likely related to recent URI.    Patient has had cardiopulmonary workup 4 years ago with PFTs and stress testing which was essentially normal.  He is not having any sort of anginal symptoms.  No orthopnea or lower extremity swelling concerning for CHF.   He usually only has shortness of breath with exertion.  No chest pain.  Physical exam notable for 2 out of 6 systolic murmur-aortic sclerosis/stenosis may be a possible source of his DOE.  Will obtain echocardiogram to rule this out.  Will likely need repeat PFTs in the near future giving  his smoking history.  Strict return precautions reviewed.  Abdominal pain/constipation Abdominal exam benign today.  Pain likely secondary to constipation.  Advised patient to start MiraLAX daily and titrate as needed to 1 soft bowel movement daily.  Return precautions reviewed including severe pain, fever, nausea/vomiting.  Follow-up as needed.  Preventative healthcare Flu shot given today.  Algis Greenhouse. Jerline Pain, MD 05/24/2017 11:11 AM

## 2017-05-24 NOTE — Assessment & Plan Note (Addendum)
Chronic problem with mild exacerbation today likely related to recent URI.    Patient has had cardiopulmonary workup 4 years ago with PFTs and stress testing which was essentially normal.  He is not having any sort of anginal symptoms.  No orthopnea or lower extremity swelling concerning for CHF.   He usually only has shortness of breath with exertion.  No chest pain.  Physical exam notable for 2 out of 6 systolic murmur-aortic sclerosis/stenosis may be a possible source of his DOE.  Will obtain echocardiogram to rule this out.  Will likely need repeat PFTs in the near future giving his smoking history.  Strict return precautions reviewed.

## 2017-05-24 NOTE — Patient Instructions (Signed)
Try miralax for your constipation.  We will schedule to you to have a heart ultrasound.  Let us know if your symptoms do not improve.  Take care,  Dr Jerline Pain

## 2017-05-30 ENCOUNTER — Other Ambulatory Visit: Payer: Self-pay | Admitting: Family Medicine

## 2017-06-15 ENCOUNTER — Telehealth: Payer: Self-pay | Admitting: Family Medicine

## 2017-06-15 NOTE — Telephone Encounter (Signed)
Called and spoke with [facility] to f/u on plastic surgery referral for patient.  Per [facility] they had reached out to patient multiple (3) times to contact and schedule. They requested we place a new referral and fax over referral and office notes. Once referral has been placed I will fax it to them at [phone].   Please speak with patient and ensure he knows they should be reaching out to him after it has been reviewed to schedule, and the number they call on should be- [phone]. He will need to make sure he either gets their call, or returns it ASAP once they have reached out to him.

## 2017-06-22 ENCOUNTER — Ambulatory Visit (HOSPITAL_COMMUNITY)
Admission: RE | Admit: 2017-06-22 | Discharge: 2017-06-22 | Disposition: A | Discharge status: Home / Self Care | Payer: Commercial Managed Care - HMO | Source: Ambulatory Visit | Attending: Family Medicine | Admitting: Family Medicine

## 2017-06-22 DIAGNOSIS — I5030 Unspecified diastolic (congestive) heart failure: Secondary | ICD-10-CM | POA: Insufficient documentation

## 2017-06-22 DIAGNOSIS — I071 Rheumatic tricuspid insufficiency: Secondary | ICD-10-CM | POA: Diagnosis not present

## 2017-06-22 DIAGNOSIS — R0609 Other forms of dyspnea: Secondary | ICD-10-CM

## 2017-06-22 NOTE — Progress Notes (Signed)
  Echocardiogram 2D Echocardiogram has been performed.  Darlina Sicilian M 06/22/2017, 11:29 AM

## 2017-06-23 NOTE — Progress Notes (Signed)
Patient's echocardiogram did not show any clear etiology for his shortness of breath. He does have some thickness to the walls of his heart and mild to moderate regurgitation of his tricuspid valve - these are not likely causing his shortness of breath and dyspnea on exertion. He should follow up with myself or his PCP to discuss further management soon.  Algis Greenhouse. Jerline Pain, MD 06/23/2017 8:18 AM

## 2017-07-12 ENCOUNTER — Other Ambulatory Visit: Payer: Self-pay

## 2017-07-12 DIAGNOSIS — D179 Benign lipomatous neoplasm, unspecified: Secondary | ICD-10-CM

## 2017-07-12 NOTE — Telephone Encounter (Signed)
Placed a new referral 

## 2017-07-13 NOTE — Telephone Encounter (Signed)
Referral has been placed and sent to [facility]. After review they will contact patient for scheduling.

## 2017-07-18 ENCOUNTER — Ambulatory Visit (INDEPENDENT_AMBULATORY_CARE_PROVIDER_SITE_OTHER): Payer: Commercial Managed Care - HMO | Admitting: Family Medicine

## 2017-07-18 ENCOUNTER — Encounter: Payer: Self-pay | Admitting: Family Medicine

## 2017-07-18 VITALS — BP 108/64 | HR 63 | Temp 98.5°F | Ht 69.0 in | Wt 174.6 lb

## 2017-07-18 DIAGNOSIS — E785 Hyperlipidemia, unspecified: Secondary | ICD-10-CM | POA: Diagnosis not present

## 2017-07-18 DIAGNOSIS — G3184 Mild cognitive impairment, so stated: Secondary | ICD-10-CM | POA: Diagnosis not present

## 2017-07-18 DIAGNOSIS — M119 Crystal arthropathy, unspecified: Secondary | ICD-10-CM | POA: Diagnosis not present

## 2017-07-18 DIAGNOSIS — R0602 Shortness of breath: Secondary | ICD-10-CM

## 2017-07-18 DIAGNOSIS — I10 Essential (primary) hypertension: Secondary | ICD-10-CM

## 2017-07-18 DIAGNOSIS — D170 Benign lipomatous neoplasm of skin and subcutaneous tissue of head, face and neck: Secondary | ICD-10-CM | POA: Diagnosis not present

## 2017-07-18 DIAGNOSIS — I700 Atherosclerosis of aorta: Secondary | ICD-10-CM | POA: Diagnosis not present

## 2017-07-18 DIAGNOSIS — G40909 Epilepsy, unspecified, not intractable, without status epilepticus: Secondary | ICD-10-CM

## 2017-07-18 DIAGNOSIS — Z0000 Encounter for general adult medical examination without abnormal findings: Secondary | ICD-10-CM | POA: Diagnosis not present

## 2017-07-18 LAB — POC URINALSYSI DIPSTICK (AUTOMATED)
Bilirubin, UA: NEGATIVE
Blood, UA: NEGATIVE
Glucose, UA: NEGATIVE
Ketones, UA: NEGATIVE
LEUKOCYTES UA: NEGATIVE
NITRITE UA: NEGATIVE
PROTEIN UA: NEGATIVE
Spec Grav, UA: 1.015 (ref 1.010–1.025)
Urobilinogen, UA: 0.2 E.U./dL
pH, UA: 7 (ref 5.0–8.0)

## 2017-07-18 LAB — COMPREHENSIVE METABOLIC PANEL
ALT: 14 U/L (ref 0–53)
AST: 21 U/L (ref 0–37)
Albumin: 4.3 g/dL (ref 3.5–5.2)
Alkaline Phosphatase: 80 U/L (ref 39–117)
BUN: 18 mg/dL (ref 6–23)
CALCIUM: 9.8 mg/dL (ref 8.4–10.5)
CHLORIDE: 96 meq/L (ref 96–112)
CO2: 30 meq/L (ref 19–32)
CREATININE: 1.41 mg/dL (ref 0.40–1.50)
GFR: 61.64 mL/min (ref >60.00)
Glucose, Bld: 96 mg/dL (ref 70–99)
Potassium: 3.8 mEq/L (ref 3.5–5.1)
SODIUM: 135 meq/L (ref 135–145)
Total Bilirubin: 0.6 mg/dL (ref 0.2–1.2)
Total Protein: 7.9 g/dL (ref 6.0–8.3)

## 2017-07-18 LAB — CBC
HCT: 40.5 % (ref 39.0–52.0)
Hemoglobin: 13.5 g/dL (ref 13.0–17.0)
MCHC: 33.4 g/dL (ref 30.0–36.0)
MCV: 88.3 fl (ref 78.0–100.0)
Platelets: 309 10*3/uL (ref 150.0–400.0)
RBC: 4.58 Mil/uL (ref 4.22–5.81)
RDW: 13.4 % (ref 11.5–15.5)
WBC: 6.3 10*3/uL (ref 4.0–10.5)

## 2017-07-18 LAB — URIC ACID: Uric Acid, Serum: 8.6 mg/dL — ABNORMAL HIGH (ref 4.0–7.8)

## 2017-07-18 NOTE — Assessment & Plan Note (Signed)
HLD- update cbc, cmp. Lipids updated back in July with LDL at goal under 100. Some slight poor control of total and triglycerides but at his age will not push dose for primary prevention Lab Results  Component Value Date   CHOL 224 (H) 02/02/2017   HDL 50.70 02/02/2017   LDLCALC 93 09/24/2013   LDLDIRECT 77.0 02/02/2017   TRIG 297.0 (H) 02/02/2017   CHOLHDL 4 02/02/2017

## 2017-07-18 NOTE — Assessment & Plan Note (Signed)
From 08/05/16 with Dr. Doristine Mango also discussed his chronic shortness of breath- saw cards in 2014 and not worsening. Cardiopulmonary stress testing stated likely deconditioning- advised regular exercise. He is trying to walk a block in his neighborhood daily- will try to slowly graduate" He has seen Dr. Jerline Pain since that time and DOE had worsened and noted murmur so echo was done. Echo largely reassuring with diastolic dysfunction and mild to moderate tricuspid regurgitation. He had PFTs and stress testing 4 years ago for the same issue. Patient tells me he had URi symptoms for around 3 weeks and once that congestion cleared his SOB went back to baseline. He is not having any chest pain.  - we discussed repeat cardiology referral or pulmonary workup- since he is back to baseline he does not want to pursue further workup at this time.

## 2017-07-18 NOTE — Addendum Note (Signed)
Addended by: Kayren Eaves T on: 07/18/2017 09:26 AM   Modules accepted: Orders

## 2017-07-18 NOTE — Assessment & Plan Note (Signed)
MCI vs. Educational limitations- has had fluctuations with MMSE from 22/30 at lowest up to 25/30. aricept given through neurology

## 2017-07-18 NOTE — Assessment & Plan Note (Signed)
Lipoma 2x 2 cm behind right eye- referred to plastic surgery over a year ago- patient had reported growth last year- now in que for referral to [facility] after several other failed attempts at referral- referred just 2 weeks ago

## 2017-07-18 NOTE — Patient Instructions (Addendum)
Please consider shingrix (the new shingles vaccination) at your pharmacy if you can find it. It is a 2 shot series and is only covered at your pharmacy. Please let us know when/if you get this.   No changes today  Should be hearing from [facility] plastic surgery- contact us if you havent heard in another 2-3 weeks so we can follow up with them.   Please stop by lab before you go

## 2017-07-18 NOTE — Progress Notes (Addendum)
Phone: [phone]  Subjective:  Patient presents today for their annual physical. Chief complaint-noted.   See problem oriented charting- ROS- full  review of systems was completed and negative except for shortness of breath as noted below  The following were reviewed and entered/updated in epic: Past Medical History:  Diagnosis Date  . HYPERLIPIDEMIA 02/22/2007  . HYPERTENSION 02/22/2007  . MCI (mild cognitive impairment) with memory loss 05/14/2014  . Seizures ([place])    treated for siezures around 2010   Patient Active Problem List   Diagnosis Date Noted  . MCI (mild cognitive impairment) with memory loss 05/14/2014    Priority: High  . Seizure disorder ([place]) 04/19/2011    Priority: High  . Aortic atherosclerosis ([place]) 08/05/2016    Priority: Medium  . 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 Shortness of breath 10/12/2012    Priority: Medium  . Hyperlipidemia 02/22/2007    Priority: Medium  . Essential hypertension 02/22/2007    Priority: Medium  . Lipoma 02/02/2017    Priority: Low  . Finger infection 06/11/2014    Priority: Low  . Carpal tunnel syndrome 08/18/2010    Priority: Low  . Cyst and pseudocyst of pancreas 04/12/2010    Priority: Low   Past Surgical History:  Procedure Laterality Date  . CARPAL TUNNEL RELEASE    . KNEE SURGERY     arthroscopic    Family History  Problem Relation Age of Onset  . Diabetes Paternal Grandmother        raised by grandparents    Medications- reviewed and updated Current Outpatient Medications  Medication Sig Dispense Refill  . amLODipine (NORVASC) 10 MG tablet TAKE 1 TABLET EVERY DAY 90 tablet 3  . aspirin 81 MG tablet Take 81 mg by mouth daily.      . chlorthalidone (HYGROTON) 25 MG tablet TAKE 1 TABLET EVERY DAY 90 tablet 3  . donepezil (ARICEPT) 5 MG tablet Take 1 tablet (5 mg total) 2 (two) times daily by mouth. 180 tablet 3  . Febuxostat 80 MG TABS Take 1 tablet (80 mg total) by mouth daily. 30  tablet 3  . levETIRAcetam (KEPPRA) 500 MG tablet Take 1 tablet (500 mg total) 2 (two) times daily by mouth. 180 tablet 3  . Multiple Vitamin (MULTIVITAMIN) capsule Take 1 capsule by mouth daily.      . simvastatin (ZOCOR) 40 MG tablet TAKE 1 TABLET EVERY DAY 90 tablet 3   Allergies-reviewed and updated No Known Allergies  Social History   Socioeconomic History  . Marital status: Married    Spouse name: [name]  . Number of children: 2  . Years of education: 10  . Highest education level: None  Social Needs  . Financial resource strain: None  . Food insecurity - worry: None  . Food insecurity - inability: None  . Transportation needs - medical: None  . Transportation needs - non-medical: None  Occupational History    Employer: RETIRED  Tobacco Use  . Smoking status: Former Smoker    Packs/day: 1.00    Years: 30.00    Pack years: 30.00    Types: Cigars, Cigarettes    Last attempt to quit: 07/11/1978    Years since quitting: 39.0  . Smokeless tobacco: Never Used  Substance and Sexual Activity  . Alcohol use: No    Alcohol/week: 0.0 oz  . Drug use: No  . Sexual activity: None  Other Topics Concern  . None  Social History Narrative  Patient is married ([name]).   Patient has 3 children. 2 grandkids. 3 greatgrandkids.    Patient is retired Administrator.    Patient is right-handed.   Patient has a 10th grade education.   Patient drinks one cup of coffee daily.      Hobbies: working in the yard- gardening competition with neighbor on who can grow best vegetables, "messing around the house"      07/21/14- wants wife to make decisions for him. Not sure if he has advanced directives.        Objective: BP 108/64 (BP Location: Left Arm, Patient Position: Sitting, Cuff Size: Large)   Pulse 63   Temp 98.5 F (36.9 C) (Oral)   Ht 5\' 9"  (1.753 m)   Wt 174 lb 9.6 oz (79.2 kg)   SpO2 95%   BMI 25.78 kg/m  Gen: NAD, resting comfortably HEENT: Mucous membranes are moist.  Oropharynx normal Neck: no thyromegaly CV: RRR. Faint murmur.  rubs or gallops Lungs: CTAB no crackles, wheeze, rhonchi Abdomen: soft/nontender/nondistended/normal bowel sounds. No rebound or guarding. Reasonable weight Ext: no edema Skin: warm, dry, about 2 x 2 cm lipoma lateral to right eye Neuro: grossly normal, moves all extremities, PERRLA  Assessment/Plan:  84 y.o. male presenting for annual physical.  Health Maintenance counseling: 1. Anticipatory guidance: Patient counseled regarding regular dental exams - false teeth so doesn't see dentist, eye exams -yearly, wearing seatbelts.  2. Risk factor reduction:  Advised patient of need for regular exercise and diet rich and fruits and vegetables to reduce risk of heart attack and stroke. Exercise- working to stay active- encouraged regular graduated exercise such as 5 minutes a day build to 15 minutes walking over 6 months. Diet-balanced diet.  Wt Readings from Last 3 Encounters:  07/18/17 174 lb 9.6 oz (79.2 kg)  05/24/17 175 lb (79.4 kg)  05/16/17 179 lb 12.8 oz (81.6 kg)  3. Immunizations/screenings/ancillary studies- discussed shingrix at the pharmacy  Immunization History  Administered Date(s) Administered  . Influenza Split 04/19/2011, 04/03/2012  . Influenza Whole 06/05/2007, 05/20/2009  . Influenza, High Dose Seasonal PF 05/24/2017  . Influenza,inj,Quad PF,6+ Mos 04/01/2013, 05/26/2015  . Influenza-Unspecified 04/19/2014, 04/27/2016  . Pneumococcal Conjugate-13 07/21/2014  . Pneumococcal Polysaccharide-23 06/18/2002  . Td 01/28/2010   4. Prostate cancer screening- passed age recommended screening  5. Colon cancer screening - adenoma in 2013 but no recall due to age  Status of chronic or acute concerns   Seizure disorder - remains on keppra through neurology.   HTN- controlled on chlorthalidone 25mg , amlodipien 10mg   Gout/crystal arthropathy- on uloric- will update uric acid  Aortic atherosclerosis- noted on x-ray  November 2017. Will continue statinand BP control.   Shortness of breath From 08/05/16 with Dr. Doristine Mango also discussed his chronic shortness of breath- saw cards in 2014 and not worsening. Cardiopulmonary stress testing stated likely deconditioning- advised regular exercise. He is trying to walk a block in his neighborhood daily- will try to slowly graduate" He has seen Dr. Jerline Pain since that time and DOE had worsened and noted murmur so echo was done. Echo largely reassuring with diastolic dysfunction and mild to moderate tricuspid regurgitation. He had PFTs and stress testing 4 years ago for the same issue. Patient tells me he had URi symptoms for around 3 weeks and once that congestion cleared his SOB went back to baseline. He is not having any chest pain.  - we discussed repeat cardiology referral or pulmonary workup- since he is back to baseline  he does not want to pursue further workup at this time.   Lipoma Lipoma 2x 2 cm behind right eye- referred to plastic surgery over a year ago- patient had reported growth last year- now in que for referral to [facility] after several other failed attempts at referral- referred just 2 weeks ago  MCI (mild cognitive impairment) with memory loss MCI vs. Educational limitations- has had fluctuations with MMSE from 22/30 at lowest up to 25/30. aricept given through neurology   Hyperlipidemia HLD- update cbc, cmp. Lipids updated back in July with LDL at goal under 100. Some slight poor control of total and triglycerides but at his age will not push dose for primary prevention Lab Results  Component Value Date   CHOL 224 (H) 02/02/2017   HDL 50.70 02/02/2017   LDLCALC 93 09/24/2013   LDLDIRECT 77.0 02/02/2017   TRIG 297.0 (H) 02/02/2017   CHOLHDL 4 02/02/2017    Future Appointments  Date Time Provider [place]  11/14/2017 11:00 AM Ward Givens, NP [facility] None   No Follow-up on file.  Orders Placed This Encounter  Procedures  . CBC     [place]  . Comprehensive metabolic panel    [place]  . Uric acid  . POCT Urinalysis Dipstick (Automated)    Standing Status:   Future    Standing Expiration Date:   08/18/2017    No orders of the defined types were placed in this encounter.   Return precautions advised.  [name], MD

## 2017-08-16 DIAGNOSIS — H524 Presbyopia: Secondary | ICD-10-CM | POA: Diagnosis not present

## 2017-08-18 DIAGNOSIS — Z0100 Encounter for examination of eyes and vision without abnormal findings: Secondary | ICD-10-CM | POA: Diagnosis not present

## 2017-11-14 ENCOUNTER — Encounter: Payer: Self-pay | Admitting: Adult Health

## 2017-11-14 ENCOUNTER — Ambulatory Visit: Payer: Medicare HMO | Admitting: Adult Health

## 2017-11-14 VITALS — BP 131/68 | HR 70 | Ht 69.0 in | Wt 175.5 lb

## 2017-11-14 DIAGNOSIS — R413 Other amnesia: Secondary | ICD-10-CM | POA: Diagnosis not present

## 2017-11-14 DIAGNOSIS — R569 Unspecified convulsions: Secondary | ICD-10-CM

## 2017-11-14 MED ORDER — DONEPEZIL HCL 5 MG PO TABS: 5.0000 mg | ORAL_TABLET | Freq: Every day | ORAL | 3 refills | Status: DC

## 2017-11-14 MED ORDER — LEVETIRACETAM 500 MG PO TABS: 500.0000 mg | ORAL_TABLET | Freq: Two times a day (BID) | ORAL | 3 refills | Status: DC

## 2017-11-14 NOTE — Progress Notes (Signed)
PATIENT: Isaiah Davidson DOB: [birth year]  REASON FOR VISIT: follow up HISTORY FROM: patient  HISTORY OF PRESENT ILLNESS: Today 11/14/17 Isaiah Davidson is an 84 year old male with a history of memory disturbance and seizures.  He returns today for follow-up.  He denies any seizure events.  He remains on Keppra 500 mg twice a day.  He is able to complete all ADLs independently.  He manages his own finances.  Denies any change in his mood or behavior.  No change in his gait or balance.  He feels that overall his memory has remained stable.  He lives at home alone.  He remains on Aricept 5 mg daily.  He returns today for an evaluation.  HISTORY11/07/18  Isaiah Davidson is an 84 year old male with a history of memory disturbance and seizures.  He returns today for follow-up.  He reports overall he is doing well.  He continues on Keppra.  He denies any seizure activity.  He feels that his memory has remained stable.  He is able to complete all ADLs independently.  Denies any trouble sleeping.  Denies any changes in his mood or behavior.  He does assist with his own finances.  He remains on Aricept 10 mg daily.  He returns today for an evaluation.    REVIEW OF SYSTEMS: Out of a complete 14 system review of symptoms, the patient complains only of the following symptoms, and all other reviewed systems are negative.  ALLERGIES: No Known Allergies  HOME MEDICATIONS: Outpatient Medications Prior to Visit  Medication Sig Dispense Refill  . amLODipine (NORVASC) 10 MG tablet TAKE 1 TABLET EVERY DAY 90 tablet 3  . aspirin 81 MG tablet Take 81 mg by mouth daily.      . chlorthalidone (HYGROTON) 25 MG tablet TAKE 1 TABLET EVERY DAY 90 tablet 3  . donepezil (ARICEPT) 5 MG tablet Take 1 tablet (5 mg total) 2 (two) times daily by mouth. 180 tablet 3  . Febuxostat 80 MG TABS Take 1 tablet (80 mg total) by mouth daily. 30 tablet 3  . levETIRAcetam (KEPPRA) 500 MG tablet Take 1 tablet (500 mg total) 2 (two) times  daily by mouth. 180 tablet 3  . Multiple Vitamin (MULTIVITAMIN) capsule Take 1 capsule by mouth daily.      . simvastatin (ZOCOR) 40 MG tablet TAKE 1 TABLET EVERY DAY 90 tablet 3   No facility-administered medications prior to visit.     PAST MEDICAL HISTORY: Past Medical History:  Diagnosis Date  . HYPERLIPIDEMIA 02/22/2007  . HYPERTENSION 02/22/2007  . MCI (mild cognitive impairment) with memory loss 05/14/2014  . Seizures ([place])    treated for siezures around 2010    PAST SURGICAL HISTORY: Past Surgical History:  Procedure Laterality Date  . CARPAL TUNNEL RELEASE    . KNEE SURGERY     arthroscopic    FAMILY HISTORY: Family History  Problem Relation Age of Onset  . Diabetes Paternal Grandmother        raised by grandparents    SOCIAL HISTORY: Social History   Socioeconomic History  . Marital status: Married    Spouse name: [name]  . Number of children: 2  . Years of education: 10  . Highest education level: Not on file  Occupational History    Employer: RETIRED  Social Needs  . Financial resource strain: Not on file  . Food insecurity:    Worry: Not on file    Inability: Not on file  . Transportation needs:  Medical: Not on file    Non-medical: Not on file  Tobacco Use  . Smoking status: Former Smoker    Packs/day: 1.00    Years: 30.00    Pack years: 30.00    Types: Cigars, Cigarettes    Last attempt to quit: 07/11/1978    Years since quitting: 39.3  . Smokeless tobacco: Never Used  Substance and Sexual Activity  . Alcohol use: No    Alcohol/week: 0.0 oz  . Drug use: No  . Sexual activity: Not on file  Lifestyle  . Physical activity:    Days per week: Not on file    Minutes per session: Not on file  . Stress: Not on file  Relationships  . Social connections:    Talks on phone: Not on file    Gets together: Not on file    Attends religious service: Not on file    Active member of club or organization: Not on file    Attends meetings of clubs  or organizations: Not on file    Relationship status: Not on file  . Intimate partner violence:    Fear of current or ex partner: Not on file    Emotionally abused: Not on file    Physically abused: Not on file    Forced sexual activity: Not on file  Other Topics Concern  . Not on file  Social History Narrative   Patient is married ([name]).   Patient has 3 children. 2 grandkids. 3 greatgrandkids.    Patient is retired Administrator.    Patient is right-handed.   Patient has a 10th grade education.   Patient drinks one cup of coffee daily.      Hobbies: working in the yard- gardening competition with neighbor on who can grow best vegetables, "messing around the house"      07/21/14- wants wife to make decisions for him. Not sure if he has advanced directives.          PHYSICAL EXAM  Vitals:   11/14/17 1050  BP: 131/68  Pulse: 70  Weight: 175 lb 8 oz (79.6 kg)  Height: '5\' 9"'  (1.753 m)   Body mass index is 25.92 kg/m.   MMSE - Mini Mental State Exam 11/14/2017 05/16/2017 11/08/2016  Not completed: - - -  Orientation to time '5 5 4  ' Orientation to Place '5 5 5  ' Registration '3 3 3  ' Attention/ Calculation '4 1 1  ' Attention/Calculation-comments - - counted back by 10s even though he was reminded to subtract 7  Recall '3 2 2  ' Language- name 2 objects '2 2 2  ' Language- repeat '1 1 1  ' Language- follow 3 step command '3 3 3  ' Language- read & follow direction '1 1 1  ' Write a sentence 0 1 0  Copy design 0 0 0  Total score '27 24 22    ' Generalized: Well developed, in no acute distress   Neurological examination  Mentation: Alert oriented to time, place, history taking. Follows all commands speech and language fluent Cranial nerve II-XII: Pupils were equal round reactive to light. Extraocular movements were full, visual field were full on confrontational test. Facial sensation and strength were normal. Uvula tongue midline. Head turning and shoulder shrug  were normal and  symmetric. Motor: The motor testing reveals 5 over 5 strength of all 4 extremities. Good symmetric motor tone is noted throughout.  Sensory: Sensory testing is intact to soft touch on all 4 extremities. No evidence of extinction is  noted.  Coordination: Cerebellar testing reveals good finger-nose-finger and heel-to-shin bilaterally.  Gait and station: Gait is normal. Tandem gait is normal. Romberg is negative. No drift is seen.  Reflexes: Deep tendon reflexes are symmetric and normal bilaterally.   DIAGNOSTIC DATA (LABS, IMAGING, TESTING) - I reviewed patient records, labs, notes, testing and imaging myself where available.  Lab Results  Component Value Date   WBC 6.3 07/18/2017   HGB 13.5 07/18/2017   HCT 40.5 07/18/2017   MCV 88.3 07/18/2017   PLT 309.0 07/18/2017      Component Value Date/Time   NA 135 07/18/2017 0901   K 3.8 07/18/2017 0901   CL 96 07/18/2017 0901   CO2 30 07/18/2017 0901   GLUCOSE 96 07/18/2017 0901   BUN 18 07/18/2017 0901   CREATININE 1.41 07/18/2017 0901   CALCIUM 9.8 07/18/2017 0901   PROT 7.9 07/18/2017 0901   ALBUMIN 4.3 07/18/2017 0901   AST 21 07/18/2017 0901   ALT 14 07/18/2017 0901   ALKPHOS 80 07/18/2017 0901   BILITOT 0.6 07/18/2017 0901   GFRNONAA 77.16 01/28/2010 0809   GFRAA  12/11/2009 0615    >60        The eGFR has been calculated using the MDRD equation. This calculation has not been validated in all clinical situations. eGFR's persistently <60 mL/min signify possible Chronic Kidney Disease.   Lab Results  Component Value Date   CHOL 224 (H) 02/02/2017   HDL 50.70 02/02/2017   LDLCALC 93 09/24/2013   LDLDIRECT 77.0 02/02/2017   TRIG 297.0 (H) 02/02/2017   CHOLHDL 4 02/02/2017   Lab Results  Component Value Date   HGBA1C  02/23/2009    5.6 (NOTE) The ADA recommends the following therapeutic goal for glycemic control related to Hgb A1c measurement: Goal of therapy: <6.5 Hgb A1c  Reference: American Diabetes Association:  Clinical Practice Recommendations 2010, Diabetes Care, 2010, 33: (Suppl  1).   No results found for: VITAMINB12 Lab Results  Component Value Date   TSH 1.30 10/01/2012      ASSESSMENT AND PLAN 84 y.o. year old male  has a past medical history of HYPERLIPIDEMIA (02/22/2007), HYPERTENSION (02/22/2007), MCI (mild cognitive impairment) with memory loss (05/14/2014), and Seizures ([place]). here with:  1.  Memory disturbance 2.  Seizures  The patient's memory score has remained stable.  He will continue on Aricept 5 mg daily.  He denies any seizure events.  He will remain on Keppra 500 mg twice a day.  He is advised that if he has any seizure events he should let us know.  He will follow-up in 6 months or sooner if needed.     Ward Givens, MSN, NP-C 11/14/2017, 11:01 AM [facility] [address] [place], [place] [address] [phone]

## 2017-11-14 NOTE — Patient Instructions (Signed)
Your Plan:  Continue Keppra  Continue Aricept Memory score has improved If your symptoms worsen or you develop new symptoms please let us know.    Thank you for coming to see [place] at [facility]. I hope we have been able to provide you high quality care today.  You may receive a patient satisfaction survey over the next few weeks. We would appreciate your feedback and comments so that we may continue to improve ourselves and the health of our patients.

## 2017-11-17 NOTE — Progress Notes (Signed)
I agree with the assessment and plan as directed by NP .The patient is known to me .   Una Yeomans, MD  

## 2018-01-05 ENCOUNTER — Telehealth: Payer: Self-pay | Admitting: Family Medicine

## 2018-01-05 NOTE — Telephone Encounter (Signed)
Called and spoke to patient who verbalized understanding of calling here locally to schedule an appointment. Advised patient to call insurance and see who they would pay for her in [place]. He verbalized understanding

## 2018-01-05 NOTE — Telephone Encounter (Signed)
See note.  >> Jan 05, 2018  3:01 PM Marin Olp L wrote: Patient missed a call possibly about the referral? No note in Epic. Please call back.

## 2018-01-05 NOTE — Telephone Encounter (Signed)
Called and left a voicemail message asking for a return phone call.  They will need to call and schedule an appointment with someone here local.

## 2018-01-05 NOTE — Telephone Encounter (Signed)
Copied from [place] [id number]. Topic: General - Other >> Jan 04, 2018 10:18 AM Carolyn Stare wrote:  Pt wife call and said they can not go to [facility] will need to see someone local. Referral was sent to [facility]      I am unsure of where or what to do with this referral now. We have sent this multiple times and I believe to multiple locations.  Please advise.

## 2018-01-15 ENCOUNTER — Ambulatory Visit: Payer: Commercial Managed Care - HMO | Admitting: Family Medicine

## 2018-01-18 ENCOUNTER — Encounter: Payer: Self-pay | Admitting: Family Medicine

## 2018-01-18 ENCOUNTER — Ambulatory Visit (INDEPENDENT_AMBULATORY_CARE_PROVIDER_SITE_OTHER): Payer: Commercial Managed Care - HMO | Admitting: Family Medicine

## 2018-01-18 VITALS — BP 132/84 | HR 69 | Temp 98.1°F | Ht 69.0 in | Wt 175.8 lb

## 2018-01-18 DIAGNOSIS — E785 Hyperlipidemia, unspecified: Secondary | ICD-10-CM | POA: Diagnosis not present

## 2018-01-18 DIAGNOSIS — G40909 Epilepsy, unspecified, not intractable, without status epilepticus: Secondary | ICD-10-CM | POA: Diagnosis not present

## 2018-01-18 DIAGNOSIS — M119 Crystal arthropathy, unspecified: Secondary | ICD-10-CM | POA: Diagnosis not present

## 2018-01-18 DIAGNOSIS — R0602 Shortness of breath: Secondary | ICD-10-CM

## 2018-01-18 DIAGNOSIS — I10 Essential (primary) hypertension: Secondary | ICD-10-CM

## 2018-01-18 LAB — COMPREHENSIVE METABOLIC PANEL
ALBUMIN: 4.4 g/dL (ref 3.5–5.2)
ALT: 17 U/L (ref 0–53)
AST: 26 U/L (ref 0–37)
Alkaline Phosphatase: 93 U/L (ref 39–117)
BILIRUBIN TOTAL: 0.6 mg/dL (ref 0.2–1.2)
BUN: 20 mg/dL (ref 6–23)
CALCIUM: 10.1 mg/dL (ref 8.4–10.5)
CHLORIDE: 99 meq/L (ref 96–112)
CO2: 31 meq/L (ref 19–32)
CREATININE: 1.45 mg/dL (ref 0.40–1.50)
GFR: 59.61 mL/min — AB (ref >60.00)
Glucose, Bld: 98 mg/dL (ref 70–99)
Potassium: 4.6 mEq/L (ref 3.5–5.1)
Sodium: 138 mEq/L (ref 135–145)
Total Protein: 8.4 g/dL — ABNORMAL HIGH (ref 6.0–8.3)

## 2018-01-18 LAB — POC URINALSYSI DIPSTICK (AUTOMATED)
BILIRUBIN UA: NEGATIVE
Blood, UA: NEGATIVE
Glucose, UA: NEGATIVE
KETONES UA: NEGATIVE
LEUKOCYTES UA: NEGATIVE
NITRITE UA: NEGATIVE
PH UA: 7 (ref 5.0–8.0)
PROTEIN UA: NEGATIVE
Spec Grav, UA: 1.01 (ref 1.010–1.025)
Urobilinogen, UA: 0.2 E.U./dL

## 2018-01-18 LAB — LIPID PANEL
CHOL/HDL RATIO: 4
Cholesterol: 220 mg/dL — ABNORMAL HIGH (ref 0–200)
HDL: 52.5 mg/dL (ref >39.00)
NonHDL: 167
TRIGLYCERIDES: 212 mg/dL — AB (ref 0.0–149.0)
VLDL: 42.4 mg/dL — AB (ref 0.0–40.0)

## 2018-01-18 LAB — LDL CHOLESTEROL, DIRECT: Direct LDL: 97 mg/dL

## 2018-01-18 NOTE — Progress Notes (Signed)
Subjective:  Isaiah Davidson is a 84 y.o. year old very pleasant male patient who presents for/with See problem oriented charting ROS- No chest pain or shortness of breath. No headache or blurry vision. No seizures   Past Medical History-  Patient Active Problem List   Diagnosis Date Noted  . MCI (mild cognitive impairment) with memory loss 05/14/2014    Priority: High  . Seizure disorder ([place]) 04/19/2011    Priority: High  . Aortic atherosclerosis ([place]) 08/05/2016    Priority: Medium  . 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 Hyperlipidemia 02/22/2007    Priority: Medium  . Essential hypertension 02/22/2007    Priority: Medium  . Lipoma 02/02/2017    Priority: Low  . Finger infection 06/11/2014    Priority: Low  . Shortness of breath 10/12/2012    Priority: Low  . Carpal tunnel syndrome 08/18/2010    Priority: Low  . Cyst and pseudocyst of pancreas 04/12/2010    Priority: Low    Medications- reviewed and updated Current Outpatient Medications  Medication Sig Dispense Refill  . amLODipine (NORVASC) 10 MG tablet TAKE 1 TABLET EVERY DAY 90 tablet 3  . chlorthalidone (HYGROTON) 25 MG tablet TAKE 1 TABLET EVERY DAY 90 tablet 3  . donepezil (ARICEPT) 5 MG tablet Take 1 tablet (5 mg total) by mouth at bedtime. 90 tablet 3  . levETIRAcetam (KEPPRA) 500 MG tablet Take 1 tablet (500 mg total) by mouth 2 (two) times daily. 180 tablet 3  . Multiple Vitamin (MULTIVITAMIN) capsule Take 1 capsule by mouth daily.      . simvastatin (ZOCOR) 40 MG tablet TAKE 1 TABLET EVERY DAY 90 tablet 3   No current facility-administered medications for this visit.     Objective: BP 132/84 (BP Location: Left Arm, Patient Position: Sitting, Cuff Size: Normal)   Pulse 69   Temp 98.1 F (36.7 C) (Oral)   Ht 5\' 9"  (1.753 m)   Wt 175 lb 12.8 oz (79.7 kg)   SpO2 96%   BMI 25.96 kg/m  Gen: NAD, resting comfortably CV: RRR no murmurs rubs or gallops Lungs: CTAB no crackles, wheeze,  rhonchi Abdomen: soft/nontender/nondistended/normal bowel sounds.  Ext: no edema Skin: warm, dry, 2x2 cm lesion on right face near eye- unchanged from prior  Assessment/Plan:  Other notes: 1.remains on aricept through neurology. MMSEs have been from 22-25   Essential hypertension S: controlled on chlorthalidone 25mg , amlodipine 10mg  BP Readings from Last 3 Encounters:  01/18/18 132/84  11/14/17 131/68  07/18/17 108/64  A/P: We discussed blood pressure goal of <140/90. Continue current meds  Hyperlipidemia S: reasonably controlled on simvastatin 40mg  Lab Results  Component Value Date   CHOL 224 (H) 02/02/2017   HDL 50.70 02/02/2017   LDLCALC 93 09/24/2013   LDLDIRECT 77.0 02/02/2017   TRIG 297.0 (H) 02/02/2017   CHOLHDL 4 02/02/2017   A/P: we opted to do bloodwork as fasting- not full year so may get charged for lipid panel (discussed should be under $30 though likely)- he agrees.   For primary prevention- discussed he could stop aspirin given his age and recent evidence about intracerebral hemorrhage risk as well as GI bleed- risks likely outweigh benefits  Shortness of breath Declines any issues with this today thankfully- will move to low priority.   Seizure disorder S: remains on keppra throug h neuro. No recent seizures A/P: continue rx and neuro follow up  Crystal arthropathy S: patient has not been taking uloric- last fill  date was 2016- has elevated uric acid but no recent gout flares Lab Results  Component Value Date   LABURIC 8.6 (H) 07/18/2017  A/P: will remain off medication- consider medication to lower uric acid if has gout flares again   Future Appointments  Date Time Provider [place]  11/20/2018 11:00 AM Ward Givens, NP [facility] None   Return in about 6 months (around 07/21/2018). For CPE advised  Lab/Order associations: Hyperlipidemia, unspecified hyperlipidemia type - Plan: Lipid panel, Comprehensive metabolic panel, POCT Urinalysis  Dipstick (Automated)  Essential hypertension  Shortness of breath  Seizure disorder ([place])  Crystal arthropathy  Return precautions advised.  [name], MD

## 2018-01-18 NOTE — Assessment & Plan Note (Signed)
S: controlled on chlorthalidone 25mg , amlodipine 10mg  BP Readings from Last 3 Encounters:  01/18/18 132/84  11/14/17 131/68  07/18/17 108/64  A/P: We discussed blood pressure goal of <140/90. Continue current meds

## 2018-01-18 NOTE — Assessment & Plan Note (Signed)
S: remains on keppra throug h neuro. No recent seizures A/P: continue rx and neuro follow up

## 2018-01-18 NOTE — Assessment & Plan Note (Signed)
S: reasonably controlled on simvastatin 40mg  Lab Results  Component Value Date   CHOL 224 (H) 02/02/2017   HDL 50.70 02/02/2017   LDLCALC 93 09/24/2013   LDLDIRECT 77.0 02/02/2017   TRIG 297.0 (H) 02/02/2017   CHOLHDL 4 02/02/2017   A/P: we opted to do bloodwork as fasting- not full year so may get charged for lipid panel (discussed should be under $30 though likely)- he agrees.   For primary prevention- discussed he could stop aspirin given his age and recent evidence about intracerebral hemorrhage risk as well as GI bleed- risks likely outweigh benefits

## 2018-01-18 NOTE — Assessment & Plan Note (Signed)
Declines any issues with this today thankfully- will move to low priority.

## 2018-01-18 NOTE — Assessment & Plan Note (Signed)
S: patient has not been taking uloric- last fill date was 2016- has elevated uric acid but no recent gout flares Lab Results  Component Value Date   LABURIC 8.6 (H) 07/18/2017  A/P: will remain off medication- consider medication to lower uric acid if has gout flares again

## 2018-01-18 NOTE — Patient Instructions (Addendum)
You may stop your aspirin- due to being above age 82 and never having heart attack, stroke, or other vascular blockage.   No other changes today  I would also like for you to sign up for an annual wellness visit with one of our nurses, Cassie or Manuela Schwartz, who both specialize in the annual wellness visit. This is a free benefit under medicare that may help [place] find additional ways to help you. Some highlights are reviewing medications, lifestyle, and doing a dementia screen.  Please check with your pharmacy to see if they have the shingrix vaccine. If they do- please get this immunization and update [place] by phone call or mychart with dates you receive the vaccine  Please stop by the lab before you go.

## 2018-01-18 NOTE — Addendum Note (Signed)
Addended by: Frutoso Chase A on: 01/18/2018 10:40 AM   Modules accepted: Orders

## 2018-02-19 ENCOUNTER — Telehealth: Payer: Self-pay | Admitting: Adult Health

## 2018-02-19 MED ORDER — LEVETIRACETAM 500 MG PO TABS: 500.0000 mg | ORAL_TABLET | Freq: Two times a day (BID) | ORAL | 0 refills | Status: DC

## 2018-02-19 NOTE — Telephone Encounter (Signed)
Pt's wife Virginia/DPR called he needs 30 day supply for levETIRAcetam (KEPPRA) 500 MG tablet. She was not aware a new RX had been sent to [facility] on 5/7. He is out today and need this asap. She will call [facility] and request medication to be sent. Please call to advise when it has been sent to [place]

## 2018-02-19 NOTE — Telephone Encounter (Signed)
I contacted the patient's wife Virgina/DPR and advised we received the refill request and submitted a 30 day supply for  Keppra 500 mg 1 tablet twice per day to [facility] to bridge Mr. Isaiah Davidson until he received his mail order prescription. Mrs. Leventhal voiced understanding and had no further questions at this time. MB RN.

## 2018-03-06 ENCOUNTER — Telehealth: Payer: Self-pay | Admitting: Family Medicine

## 2018-03-06 DIAGNOSIS — D179 Benign lipomatous neoplasm, unspecified: Secondary | ICD-10-CM

## 2018-03-06 NOTE — Telephone Encounter (Signed)
See note

## 2018-03-06 NOTE — Telephone Encounter (Signed)
Copied from [place] [id number]. Topic: Referral - Request >> Mar 06, 2018  3:08 PM Gardiner Ramus wrote: Reason for CRM: pt wife called and stated that pt would like a referral to have a cyst removed from face. She would like him referred to [facility] [place] phone [phone]

## 2018-03-06 NOTE — Telephone Encounter (Signed)
Please see message and advise 

## 2018-03-06 NOTE — Telephone Encounter (Signed)
May refer- I thought he had been rejected from everywhere in town that takes his insurance as we had to set him up referrals out of city previously. If they will accept- Im fine with referral- try to get this referral in before [facility] leaves/get processed

## 2018-03-07 NOTE — Telephone Encounter (Signed)
Spoke to pt's wife [name], told her I put order in for referral to [facility] as requested someone will be contacting him to schedule an appt. [name] verbalized understanding.

## 2018-03-20 DIAGNOSIS — R220 Localized swelling, mass and lump, head: Secondary | ICD-10-CM | POA: Diagnosis not present

## 2018-03-21 ENCOUNTER — Other Ambulatory Visit: Payer: Self-pay | Admitting: Family Medicine

## 2018-03-21 DIAGNOSIS — L720 Epidermal cyst: Secondary | ICD-10-CM | POA: Diagnosis not present

## 2018-03-21 DIAGNOSIS — L729 Follicular cyst of the skin and subcutaneous tissue, unspecified: Secondary | ICD-10-CM | POA: Diagnosis not present

## 2018-06-14 ENCOUNTER — Encounter (HOSPITAL_COMMUNITY): Payer: Self-pay | Admitting: Radiology

## 2018-06-14 ENCOUNTER — Other Ambulatory Visit: Payer: Self-pay

## 2018-06-14 ENCOUNTER — Ambulatory Visit (INDEPENDENT_AMBULATORY_CARE_PROVIDER_SITE_OTHER)
Admission: EM | Admit: 2018-06-14 | Discharge: 2018-06-14 | Disposition: A | Discharge status: Home / Self Care | Payer: Medicare HMO | Source: Home / Self Care | Attending: Family Medicine | Admitting: Family Medicine

## 2018-06-14 ENCOUNTER — Encounter (HOSPITAL_COMMUNITY): Payer: Self-pay

## 2018-06-14 ENCOUNTER — Emergency Department (HOSPITAL_COMMUNITY): Payer: Medicare HMO

## 2018-06-14 ENCOUNTER — Inpatient Hospital Stay (HOSPITAL_COMMUNITY)
Admission: EM | Admit: 2018-06-14 | Discharge: 2018-06-18 | DRG: 418 | Disposition: A | Discharge status: Home / Self Care | Payer: Medicare HMO | Attending: General Surgery | Admitting: General Surgery

## 2018-06-14 DIAGNOSIS — K828 Other specified diseases of gallbladder: Secondary | ICD-10-CM | POA: Diagnosis not present

## 2018-06-14 DIAGNOSIS — R1084 Generalized abdominal pain: Secondary | ICD-10-CM | POA: Diagnosis not present

## 2018-06-14 DIAGNOSIS — Z87891 Personal history of nicotine dependence: Secondary | ICD-10-CM

## 2018-06-14 DIAGNOSIS — Z79899 Other long term (current) drug therapy: Secondary | ICD-10-CM

## 2018-06-14 DIAGNOSIS — K921 Melena: Secondary | ICD-10-CM | POA: Diagnosis not present

## 2018-06-14 DIAGNOSIS — G3184 Mild cognitive impairment, so stated: Secondary | ICD-10-CM | POA: Diagnosis present

## 2018-06-14 DIAGNOSIS — N179 Acute kidney failure, unspecified: Secondary | ICD-10-CM | POA: Diagnosis present

## 2018-06-14 DIAGNOSIS — E876 Hypokalemia: Secondary | ICD-10-CM | POA: Diagnosis present

## 2018-06-14 DIAGNOSIS — K5730 Diverticulosis of large intestine without perforation or abscess without bleeding: Secondary | ICD-10-CM | POA: Diagnosis not present

## 2018-06-14 DIAGNOSIS — I10 Essential (primary) hypertension: Secondary | ICD-10-CM | POA: Diagnosis present

## 2018-06-14 DIAGNOSIS — R109 Unspecified abdominal pain: Secondary | ICD-10-CM | POA: Diagnosis not present

## 2018-06-14 DIAGNOSIS — K8000 Calculus of gallbladder with acute cholecystitis without obstruction: Principal | ICD-10-CM | POA: Diagnosis present

## 2018-06-14 DIAGNOSIS — R1110 Vomiting, unspecified: Secondary | ICD-10-CM | POA: Diagnosis not present

## 2018-06-14 DIAGNOSIS — E785 Hyperlipidemia, unspecified: Secondary | ICD-10-CM | POA: Diagnosis present

## 2018-06-14 DIAGNOSIS — N289 Disorder of kidney and ureter, unspecified: Secondary | ICD-10-CM | POA: Diagnosis not present

## 2018-06-14 DIAGNOSIS — N2889 Other specified disorders of kidney and ureter: Secondary | ICD-10-CM

## 2018-06-14 DIAGNOSIS — G40909 Epilepsy, unspecified, not intractable, without status epilepticus: Secondary | ICD-10-CM | POA: Diagnosis present

## 2018-06-14 DIAGNOSIS — K829 Disease of gallbladder, unspecified: Secondary | ICD-10-CM | POA: Diagnosis not present

## 2018-06-14 DIAGNOSIS — K82A1 Gangrene of gallbladder in cholecystitis: Secondary | ICD-10-CM | POA: Diagnosis present

## 2018-06-14 DIAGNOSIS — Z9049 Acquired absence of other specified parts of digestive tract: Secondary | ICD-10-CM | POA: Diagnosis present

## 2018-06-14 DIAGNOSIS — K819 Cholecystitis, unspecified: Secondary | ICD-10-CM | POA: Diagnosis not present

## 2018-06-14 DIAGNOSIS — I700 Atherosclerosis of aorta: Secondary | ICD-10-CM | POA: Diagnosis present

## 2018-06-14 DIAGNOSIS — R112 Nausea with vomiting, unspecified: Secondary | ICD-10-CM | POA: Diagnosis not present

## 2018-06-14 DIAGNOSIS — K812 Acute cholecystitis with chronic cholecystitis: Secondary | ICD-10-CM | POA: Diagnosis not present

## 2018-06-14 LAB — COMPREHENSIVE METABOLIC PANEL
ALBUMIN: 3.1 g/dL — AB (ref 3.5–5.0)
ALT: 28 U/L (ref 0–44)
AST: 49 U/L — ABNORMAL HIGH (ref 15–41)
Alkaline Phosphatase: 86 U/L (ref 38–126)
Anion gap: 16 — ABNORMAL HIGH (ref 5–15)
BUN: 44 mg/dL — ABNORMAL HIGH (ref 8–23)
CHLORIDE: 90 mmol/L — AB (ref 98–111)
CO2: 25 mmol/L (ref 22–32)
Calcium: 9 mg/dL (ref 8.9–10.3)
Creatinine, Ser: 1.82 mg/dL — ABNORMAL HIGH (ref 0.61–1.24)
GFR calc Af Amer: 39 mL/min — ABNORMAL LOW (ref >60)
GFR calc non Af Amer: 33 mL/min — ABNORMAL LOW (ref >60)
Glucose, Bld: 133 mg/dL — ABNORMAL HIGH (ref 70–99)
Potassium: 3.3 mmol/L — ABNORMAL LOW (ref 3.5–5.1)
Sodium: 131 mmol/L — ABNORMAL LOW (ref 135–145)
Total Bilirubin: 1 mg/dL (ref 0.3–1.2)
Total Protein: 7.8 g/dL (ref 6.5–8.1)

## 2018-06-14 LAB — POCT URINALYSIS DIP (DEVICE)
Bilirubin Urine: NEGATIVE
GLUCOSE, UA: 100 mg/dL — AB
Ketones, ur: NEGATIVE mg/dL
Nitrite: NEGATIVE
Protein, ur: >=300 mg/dL — AB
Specific Gravity, Urine: 1.015 (ref 1.005–1.030)
Urobilinogen, UA: 0.2 mg/dL (ref 0.0–1.0)
pH: >=9 (ref 5.0–8.0)

## 2018-06-14 LAB — POCT I-STAT, CHEM 8
BUN: 41 mg/dL — ABNORMAL HIGH (ref 8–23)
Calcium, Ion: 0.78 mmol/L — CL (ref 1.15–1.40)
Chloride: 99 mmol/L (ref 98–111)
Creatinine, Ser: 1.7 mg/dL — ABNORMAL HIGH (ref 0.61–1.24)
Glucose, Bld: 133 mg/dL — ABNORMAL HIGH (ref 70–99)
HEMATOCRIT: 46 % (ref 39.0–52.0)
Hemoglobin: 15.6 g/dL (ref 13.0–17.0)
Potassium: 3.2 mmol/L — ABNORMAL LOW (ref 3.5–5.1)
Sodium: 128 mmol/L — ABNORMAL LOW (ref 135–145)
TCO2: 22 mmol/L (ref 22–32)

## 2018-06-14 LAB — CBC
HCT: 42.9 % (ref 39.0–52.0)
Hemoglobin: 14.2 g/dL (ref 13.0–17.0)
MCH: 28.5 pg (ref 26.0–34.0)
MCHC: 33.1 g/dL (ref 30.0–36.0)
MCV: 86.1 fL (ref 80.0–100.0)
Platelets: 194 10*3/uL (ref 150–400)
RBC: 4.98 MIL/uL (ref 4.22–5.81)
RDW: 13.1 % (ref 11.5–15.5)
WBC: 21.5 10*3/uL — ABNORMAL HIGH (ref 4.0–10.5)
nRBC: 0 % (ref 0.0–0.2)

## 2018-06-14 LAB — LIPASE, BLOOD: Lipase: 40 U/L (ref 11–51)

## 2018-06-14 LAB — OCCULT BLOOD, POC DEVICE: Fecal Occult Bld: POSITIVE — AB

## 2018-06-14 MED ORDER — ONDANSETRON 4 MG PO TBDP
4.0000 mg | ORAL_TABLET | Freq: Once | ORAL | Status: AC
  Administered 2018-06-14: 4 mg via ORAL

## 2018-06-14 MED ORDER — SODIUM CHLORIDE 0.9 % IV BOLUS
500.0000 mL | Freq: Once | INTRAVENOUS | Status: AC
  Administered 2018-06-14: 500 mL via INTRAVENOUS

## 2018-06-14 MED ORDER — SODIUM CHLORIDE 0.9 % IV SOLN
2.0000 g | Freq: Once | INTRAVENOUS | Status: AC
  Administered 2018-06-14: 2 g via INTRAVENOUS
  Filled 2018-06-14: qty 20

## 2018-06-14 MED ORDER — IOHEXOL 300 MG/ML  SOLN
100.0000 mL | Freq: Once | INTRAMUSCULAR | Status: AC | PRN
  Administered 2018-06-14: 100 mL via INTRAVENOUS

## 2018-06-14 MED ORDER — ONDANSETRON 4 MG PO TBDP
ORAL_TABLET | ORAL | Status: AC
  Filled 2018-06-14: qty 1

## 2018-06-14 NOTE — ED Notes (Signed)
Pt. To U/S via stretcher.

## 2018-06-14 NOTE — ED Notes (Signed)
Patient transported to CT 

## 2018-06-14 NOTE — ED Provider Notes (Addendum)
[facility] EMERGENCY DEPARTMENT Provider Note   CSN: [id number] Arrival date & time: 06/14/18  1847     History   Chief Complaint Chief Complaint  Patient presents with  . Emesis    HPI Isaiah Davidson is a 84 y.o. male.  HPI   He presents for evaluation of nausea and vomiting, with decreased oral intake since today.  He denies diarrhea, constipation, dysuria or urinary frequency.  He occasionally coughs and brings up sputum.  He went to an urgent care today had some labs there and then they directed him here for further evaluation.  He had a similar problem about 10 years ago when he had a "bacteria in my stomach."  He denies weakness, dizziness, chest pain or shortness of breath.  There are no other known modifying factors.  Past Medical History:  Diagnosis Date  . HYPERLIPIDEMIA 02/22/2007  . HYPERTENSION 02/22/2007  . MCI (mild cognitive impairment) with memory loss 05/14/2014  . Seizures ([place])    treated for siezures around 2010    Patient Active Problem List   Diagnosis Date Noted  . Lipoma 02/02/2017  . Aortic atherosclerosis ([place]) 08/05/2016  . 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 Finger infection 06/11/2014  . MCI (mild cognitive impairment) with memory loss 05/14/2014  . Shortness of breath 10/12/2012  . Seizure disorder ([place]) 04/19/2011  . Carpal tunnel syndrome 08/18/2010  . Cyst and pseudocyst of pancreas 04/12/2010  . Hyperlipidemia 02/22/2007  . Essential hypertension 02/22/2007    Past Surgical History:  Procedure Laterality Date  . CARPAL TUNNEL RELEASE    . KNEE SURGERY     arthroscopic        Home Medications    Prior to Admission medications   Medication Sig Start Date End Date Taking? Authorizing Provider  amLODipine (NORVASC) 10 MG tablet TAKE 1 TABLET EVERY DAY Patient taking differently: Take 10 mg by mouth daily.  03/21/18  Yes Marin Olp, MD  chlorthalidone (HYGROTON) 25 MG tablet TAKE 1 TABLET EVERY  DAY Patient taking differently: Take 25 mg by mouth daily.  03/21/18  Yes Marin Olp, MD  donepezil (ARICEPT) 5 MG tablet Take 1 tablet (5 mg total) by mouth at bedtime. 11/14/17  Yes Ward Givens, NP  levETIRAcetam (KEPPRA) 500 MG tablet Take 500 mg by mouth 2 (two) times daily.   Yes [provider]  Multiple Vitamin (MULTIVITAMIN) capsule Take 1 capsule by mouth daily.     Yes [provider]  simvastatin (ZOCOR) 40 MG tablet TAKE 1 TABLET EVERY DAY Patient taking differently: Take 40 mg by mouth daily.  03/21/18  Yes Marin Olp, MD    Family History Family History  Problem Relation Age of Onset  . Diabetes Paternal Grandmother        raised by grandparents    Social History Social History   Tobacco Use  . Smoking status: Former Smoker    Packs/day: 1.00    Years: 30.00    Pack years: 30.00    Types: Cigars, Cigarettes    Last attempt to quit: 07/11/1978    Years since quitting: 39.9  . Smokeless tobacco: Never Used  Substance Use Topics  . Alcohol use: No    Alcohol/week: 0.0 standard drinks  . Drug use: No     Allergies   Patient has no known allergies.   Review of Systems Review of Systems  All other systems reviewed and are negative.    Physical Exam Updated Vital  Signs BP (!) 139/92   Pulse 89   Temp 99.2 F (37.3 C) (Oral)   Resp 20   SpO2 92%   Physical Exam  Constitutional: He is oriented to person, place, and time. He appears well-developed and well-nourished.  HENT:  Head: Normocephalic and atraumatic.  Right Ear: External ear normal.  Left Ear: External ear normal.  Eyes: Pupils are equal, round, and reactive to light. Conjunctivae and EOM are normal.  Neck: Normal range of motion and phonation normal. Neck supple.  Cardiovascular: Normal rate, regular rhythm and normal heart sounds.  Pulmonary/Chest: Effort normal and breath sounds normal. No respiratory distress. He has no wheezes. He has no rales. He exhibits  no bony tenderness.  Abdominal: Soft. He exhibits distension. He exhibits no mass. There is no tenderness. There is no rebound and no guarding. No hernia.  Musculoskeletal: Normal range of motion. He exhibits no edema or deformity.  Neurological: He is alert and oriented to person, place, and time. No cranial nerve deficit or sensory deficit. He exhibits normal muscle tone. Coordination normal.  Skin: Skin is warm, dry and intact.  Psychiatric: He has a normal mood and affect. His behavior is normal. Judgment and thought content normal.  Nursing note and vitals reviewed.    ED Treatments / Results  Labs (all labs ordered are listed, but only abnormal results are displayed) Labs Reviewed  COMPREHENSIVE METABOLIC PANEL - Abnormal; Notable for the following components:      Result Value   Sodium 131 (*)    Potassium 3.3 (*)    Chloride 90 (*)    Glucose, Bld 133 (*)    BUN 44 (*)    Creatinine, Ser 1.82 (*)    Albumin 3.1 (*)    AST 49 (*)    GFR calc non Af Amer 33 (*)    GFR calc Af Amer 39 (*)    Anion gap 16 (*)    All other components within normal limits  CBC - Abnormal; Notable for the following components:   WBC 21.5 (*)    All other components within normal limits  URINALYSIS, ROUTINE W REFLEX MICROSCOPIC - Abnormal; Notable for the following components:   APPearance HAZY (*)    Specific Gravity, Urine >1.046 (*)    pH 9.0 (*)    Hgb urine dipstick SMALL (*)    Protein, ur 100 (*)    Leukocytes, UA MODERATE (*)    All other components within normal limits  LIPASE, BLOOD    EKG EKG Interpretation  Date/Time:  Thursday June 14 2018 21:03:12 EST Ventricular Rate:  94 PR Interval:    QRS Duration: 93 QT Interval:  382 QTC Calculation: 478 R Axis:   -25 Text Interpretation:  Sinus rhythm Borderline left axis deviation Borderline T wave abnormalities Borderline prolonged QT interval Since last tracing of earlier today No significant change was found Confirmed  by Daleen Bo [phone]) on 06/14/2018 11:47:11 PM   Radiology Dg Chest 2 View  Result Date: 06/14/2018 CLINICAL DATA:  Abdominal pain, nausea, vomiting and diarrhea for the past 3 days. Ex-smoker. EXAM: CHEST - 2 VIEW COMPARISON:  06/09/2016. FINDINGS: Normal sized heart. The previously demonstrated left diaphragmatic eventration is smaller. Interval linear densities at the right lung base and increased linear densities at the left lung base. Mild peribronchial thickening. Mild bilateral shoulder degenerative changes. IMPRESSION: Interval bibasilar linear atelectasis or scarring and mild bronchitic changes. Electronically Signed   By: Claudie Revering M.D.   On:  06/14/2018 19:22   Ct Abdomen Pelvis W Contrast  Result Date: 06/14/2018 CLINICAL DATA:  Abdominal pain EXAM: CT ABDOMEN AND PELVIS WITH CONTRAST TECHNIQUE: Multidetector CT imaging of the abdomen and pelvis was performed using the standard protocol following bolus administration of intravenous contrast. CONTRAST:  132mL OMNIPAQUE IOHEXOL 300 MG/ML  SOLN COMPARISON:  Ultrasound 01/28/2015.  CT 10/07/2009. FINDINGS: Lower chest: Linear scarring or atelectasis in the lung bases. Cardiomegaly. Hepatobiliary: Scattered cysts within the liver, stable. Gallbladder is moderately distended. Small stones or polyps noted along the gallbladder wall. Slight inflammation noted around the gallbladder. : No focal abnormality or ductal dilatation. Spleen: No focal abnormality.  Normal size. Adrenals/Urinary Tract: Multiple bilateral renal cysts. There is a new 1.8 cm low-density lesion in the mid to lower pole of the left kidney on image 32 which appears to demonstrate some internal enhancement concerning for a solid renal lesion. No hydronephrosis. Adrenal glands and urinary bladder unremarkable. Stomach/Bowel: Sigmoid diverticulosis. No active diverticulitis. Appendix is normal. No evidence of bowel obstruction. Vascular/Lymphatic: Aortic atherosclerosis. No  enlarged abdominal or pelvic lymph nodes. Reproductive: No visible focal abnormality. Other: No free fluid or free air. Musculoskeletal: No acute bony abnormality. Diffuse degenerative disc and facet disease in the lumbar spine. IMPRESSION: Mildly distended gallbladder. There are noncalcified structures along the gallbladder wall which could reflect gallstones or polyps. There is mild stranding/inflammation around the pancreas. Cannot exclude cholecystitis. New small low-density lesion in the mid to lower pole of the left kidney measures 1.8 cm. Suggestion of possible internal enhancement. Cannot exclude solid enhancing lesion. This could be further evaluated with elective MRI if felt clinically indicated. Multiple bilateral renal cysts. Aortic atherosclerosis. Sigmoid diverticulosis. Bibasilar scarring or atelectasis.  Cardiomegaly. Electronically Signed   By: Rolm Baptise M.D.   On: 06/14/2018 21:48   US Abdomen Limited  Result Date: 06/14/2018 CLINICAL DATA:  Initial evaluation for acute abdominal pain, nausea, vomiting, diarrhea. EXAM: ULTRASOUND ABDOMEN LIMITED RIGHT UPPER QUADRANT COMPARISON:  Prior CT from earlier the same day. FINDINGS: Gallbladder: Gallbladder distended with heterogeneous echogenic material within the gallbladder lumen, likely sludge and/or sludge balls. 1.8 cm hyperechoic structure with posterior acoustic shadowing likely reflects a superimposed stone. Gallbladder wall thickened up to 7.2 mm with mild mural edema. No free pericholecystic fluid. No sonographic Murphy sign elicited on exam. Common bile duct: Diameter: 5.7 mm Liver: 1.5 cm cyst noted within the left hepatic lobe. Additional previously seen liver cysts not well visualized by sonography. Mildly increased echotexture of the hepatic parenchyma suggesting mild steatosis. Portal vein not assessed on this examination. IMPRESSION: 1. Heterogeneous echogenic and hyperechoic material within the gallbladder lumen, most likely  reflecting stones and/or sludge. Possible gallbladder mass not entirely excluded, although felt to be less likely. Finding could be further assessed with dedicated MRI/MRCP for further evaluation as warranted. 2. Associated gallbladder wall thickening up to 7 mm. Clinical correlation for possible acute cholecystitis recommended. 3. No biliary dilatation. Electronically Signed   By: Jeannine Boga M.D.   On: 06/14/2018 23:58    Procedures .Critical Care Performed by: Daleen Bo, MD Authorized by: Daleen Bo, MD   Critical care provider statement:    Critical care time (minutes):  35   Critical care start time:  06/14/2018 8:50 PM   Critical care end time:  06/15/2018 12:38 AM   Critical care time was exclusive of:  Separately billable procedures and treating other patients   Critical care was necessary to treat or prevent imminent or life-threatening deterioration of  the following conditions: Cholecystitis.   Critical care was time spent personally by me on the following activities:  Blood draw for specimens, development of treatment plan with patient or surrogate, discussions with consultants, evaluation of patient's response to treatment, examination of patient, obtaining history from patient or surrogate, ordering and performing treatments and interventions, ordering and review of laboratory studies, pulse oximetry, re-evaluation of patient's condition, review of old charts and ordering and review of radiographic studies   (including critical care time)  Medications Ordered in ED Medications  sodium chloride 0.9 % bolus 500 mL (0 mLs Intravenous Stopped 06/14/18 2303)  iohexol (OMNIPAQUE) 300 MG/ML solution 100 mL (100 mLs Intravenous Contrast Given 06/14/18 2126)  cefTRIAXone (ROCEPHIN) 2 g in sodium chloride 0.9 % 100 mL IVPB (0 g Intravenous Stopped 06/15/18 0032)     Initial Impression / Assessment and Plan / ED Course  I have reviewed the triage vital signs and the nursing  notes.  Pertinent labs & imaging results that were available during my care of the patient were reviewed by me and considered in my medical decision making (see chart for details).  Clinical Course as of Jun 15 40  Fri Jun 15, 2018  0024 Normal except sodium low, potassium low, chloride low, glucose high, BUN high, creatinine high, albumin low, AST high, GFR low, anion gap elevated  Comprehensive metabolic panel(!) [EW]  1638 Normal  Lipase, blood [EW]  0025 Normal except white count high  CBC(!) [EW]  0025 CT Abdomen Pelvis W Contrast [EW]  0025 DG Chest 2 View [EW]  0025 No CHF or infiltrate, images reviewed by me  DG Chest 2 View [EW]  0025 CT Abdomen Pelvis W Contrast [EW]  0026 Imaging indicates, gallstones versus gallbladder polyps, with possible cholecystitis.  Incidental left kidney mass 1.8 cm.  This will need additional imaging.  Images reviewed by me.  CT Abdomen Pelvis W Contrast [EW]  0027 Ultrasound ordered to assess the right upper quadrant and gallbladder.   [EW]  0028 Findings are consistent with cholecystitis, if clinically appropriate.  Additional imaging may be indicated.   [EW]  4665 Discussed with general surgery, Dr. Redmond Pulling who will see the patient and admit him.   [EW]  0041 Normal except elevated specific gravity, elevated pH, small amount of blood, small amount of protein, elevated leukocytes, 11-20 white blood cells per high-power field, but no bacteria were seen.  These are nonspecific findings indicate primarily dehydration.  Urinalysis, Routine w reflex microscopic(!) [EW]    Clinical Course User Index [EW] Daleen Bo, MD     Patient Vitals for the past 24 hrs:  BP Temp Temp src Pulse Resp SpO2  06/14/18 2330 (!) 139/92 - - 89 20 92 %  06/14/18 2300 134/89 - - 90 (!) 22 93 %  06/14/18 2245 135/89 - - 86 (!) 21 92 %  06/14/18 2209 134/87 - - 91 (!) 28 94 %  06/14/18 2115 - - - 95 (!) 26 95 %  06/14/18 2100 (!) 142/91 - - - - -  06/14/18 1851  (!) 143/92 99.2 F (37.3 C) Oral (!) 106 16 91 %    12:28 AM Reevaluation with update and discussion. After initial assessment and treatment, an updated evaluation reveals he is comfortable has no further complaints.  At this time he has mild right upper quadrant tenderness.  Findings discussed and questions answered. Daleen Bo   Medical Decision Making: Evaluation consistent with gallbladder disease causing abdominal discomfort, nausea and vomiting.  Incidental left renal mass, will need additional imaging later.  Patient will require admission.  Treatment begun for cholecystitis.  CRITICAL CARE-yes Performed by: Daleen Bo  Nursing Notes Reviewed/ Care Coordinated Applicable Imaging Reviewed Interpretation of Laboratory Data incorporated into ED treatment  Plan: Admit to surgery  Final Clinical Impressions(s) / ED Diagnoses   Final diagnoses:  Gallbladder disease  Left renal mass    ED Discharge Orders    None       Daleen Bo, MD 06/15/18 0349    Daleen Bo, MD 06/15/18 0041

## 2018-06-14 NOTE — ED Provider Notes (Signed)
[place]    CSN: [id number] Arrival date & time: 06/14/18  1619     History   Chief Complaint Chief Complaint  Patient presents with  . Abdominal Pain  . Emesis    HPI Isaiah Davidson is a 84 y.o. male.   HPI  Isaiah Davidson is here with his wife.  [name].  Much of the history comes from the wife.  She states that he has not had any solid food in his belly since Monday or Tuesday.  He has had vomiting ever since then.  He will keep down sips of fluids.  Sometimes he throws of the water.  He is having regular bowel movements.  He states the bowel movements are "black".  He is developed right-sided abdominal pain.  He states he does not keep down much food or fluids today.  He states the pain is crampy and comes and goes.  He states it was worse before and somewhat better today.  He is feeling somewhat tired.  No fever chills.  No cough or chest congestion.  No chest pain. He had a colonoscopy in 2013.  He did have some diverticular disease.  A small polyp.  Otherwise negative.  Past Medical History:  Diagnosis Date  . HYPERLIPIDEMIA 02/22/2007  . HYPERTENSION 02/22/2007  . MCI (mild cognitive impairment) with memory loss 05/14/2014  . Seizures ([place])    treated for siezures around 2010    Patient Active Problem List   Diagnosis Date Noted  . Lipoma 02/02/2017  . Aortic atherosclerosis ([place]) 08/05/2016  . 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 Finger infection 06/11/2014  . MCI (mild cognitive impairment) with memory loss 05/14/2014  . Shortness of breath 10/12/2012  . Seizure disorder ([place]) 04/19/2011  . Carpal tunnel syndrome 08/18/2010  . Cyst and pseudocyst of pancreas 04/12/2010  . Hyperlipidemia 02/22/2007  . Essential hypertension 02/22/2007    Past Surgical History:  Procedure Laterality Date  . CARPAL TUNNEL RELEASE    . KNEE SURGERY     arthroscopic       Home Medications    Prior to  Admission medications   Medication Sig Start Date End Date Taking? Authorizing Provider  amLODipine (NORVASC) 10 MG tablet TAKE 1 TABLET EVERY DAY 03/21/18   Marin Olp, MD  chlorthalidone (HYGROTON) 25 MG tablet TAKE 1 TABLET EVERY DAY 03/21/18   Marin Olp, MD  donepezil (ARICEPT) 5 MG tablet Take 1 tablet (5 mg total) by mouth at bedtime. 11/14/17   Ward Givens, NP  levETIRAcetam (KEPPRA) 500 MG tablet Take 500 mg by mouth 2 (two) times daily.    [provider]  Multiple Vitamin (MULTIVITAMIN) capsule Take 1 capsule by mouth daily.      [provider]  simvastatin (ZOCOR) 40 MG tablet TAKE 1 TABLET EVERY DAY 03/21/18   Marin Olp, MD    Family History Family History  Problem Relation Age of Onset  . Diabetes Paternal Grandmother        raised by grandparents    Social History Social History   Tobacco Use  . Smoking status: Former Smoker    Packs/day: 1.00    Years: 30.00    Pack years: 30.00    Types: Cigars, Cigarettes    Last attempt to quit: 07/11/1978    Years since quitting: 39.9  . Smokeless tobacco: Never Used  Substance Use Topics  . Alcohol use: No  Alcohol/week: 0.0 standard drinks  . Drug use: No     Allergies   Patient has no known allergies.   Review of Systems Review of Systems  Constitutional: Positive for fatigue. Negative for chills and fever.  HENT: Negative for ear pain and sore throat.   Eyes: Negative for pain and visual disturbance.  Respiratory: Negative for cough and shortness of breath.   Cardiovascular: Negative for chest pain and palpitations.  Gastrointestinal: Positive for abdominal distention, abdominal pain, blood in stool, nausea and vomiting.  Genitourinary: Negative for dysuria and hematuria.  Musculoskeletal: Negative for arthralgias and back pain.  Skin: Negative for color change and rash.  Neurological: Negative for seizures and syncope.  Psychiatric/Behavioral: Positive for decreased  concentration.  All other systems reviewed and are negative.    Physical Exam Triage Vital Signs ED Triage Vitals  Enc Vitals Group     BP 06/14/18 1639 127/79     Pulse Rate 06/14/18 1639 88     Resp 06/14/18 1639 18     Temp 06/14/18 1639 99.7 F (37.6 C)     Temp Source 06/14/18 1639 Oral     SpO2 06/14/18 1639 100 %     Weight 06/14/18 1642 170 lb (77.1 kg)     Height --      Head Circumference --      Peak Flow --      Pain Score 06/14/18 1641 9     Pain Loc --      Pain Edu? --      Excl. in [place]? --    No data found.  Updated Vital Signs BP 127/79 (BP Location: Right Arm)   Pulse 88   Temp 99.7 F (37.6 C) (Oral)   Resp 18   Wt 77.1 kg   SpO2 100%   BMI 25.10 kg/m      Physical Exam  Constitutional: He appears well-developed and well-nourished. He appears ill. No distress.  HENT:  Head: Normocephalic and atraumatic.  Mouth/Throat: Oropharynx is clear and moist.  Eyes: Pupils are equal, round, and reactive to light. Conjunctivae are normal.  Neck: Normal range of motion.  Cardiovascular: Regular rhythm and normal heart sounds.  Tachycardia  Pulmonary/Chest: Effort normal and breath sounds normal. No respiratory distress.  Abdominal: Soft. He exhibits no distension. Bowel sounds are decreased. There is hepatosplenomegaly. There is generalized tenderness. There is rigidity and guarding.  Genitourinary: Rectal exam shows guaiac positive stool. Rectal exam shows no mass and no tenderness.  Musculoskeletal: Normal range of motion. He exhibits no edema.  Neurological: He is alert.  Skin: Skin is warm and dry.  Psychiatric: He has a normal mood and affect. His behavior is normal.  Looks to wife for some answers  Vitals reviewed.    UC Treatments / Results  Labs (all labs ordered are listed, but only abnormal results are displayed) Labs Reviewed  OCCULT BLOOD, POC DEVICE - Abnormal; Notable for the following components:      Result Value   Fecal Occult  Bld POSITIVE (*)    All other components within normal limits  POCT I-STAT, CHEM 8 - Abnormal; Notable for the following components:   Sodium 128 (*)    Potassium 3.2 (*)    BUN 41 (*)    Creatinine, Ser 1.70 (*)    Glucose, Bld 133 (*)    Calcium, Ion 0.78 (*)    All other components within normal limits  POCT URINALYSIS DIP (DEVICE) - Abnormal; Notable for the  following components:   Glucose, UA 100 (*)    Hgb urine dipstick MODERATE (*)    Protein, ur >=300 (*)    Leukocytes, UA TRACE (*)    All other components within normal limits    EKG None  Radiology Dg Chest 2 View  Result Date: 06/14/2018 CLINICAL DATA:  Abdominal pain, nausea, vomiting and diarrhea for the past 3 days. Ex-smoker. EXAM: CHEST - 2 VIEW COMPARISON:  06/09/2016. FINDINGS: Normal sized heart. The previously demonstrated left diaphragmatic eventration is smaller. Interval linear densities at the right lung base and increased linear densities at the left lung base. Mild peribronchial thickening. Mild bilateral shoulder degenerative changes. IMPRESSION: Interval bibasilar linear atelectasis or scarring and mild bronchitic changes. Electronically Signed   By: Claudie Revering M.D.   On: 06/14/2018 19:22    Procedures Procedures (including critical care time)  Medications Ordered in UC Medications  ondansetron (ZOFRAN-ODT) disintegrating tablet 4 mg (4 mg Oral Given 06/14/18 1809)    Initial Impression / Assessment and Plan / UC Course  I have reviewed the triage vital signs and the nursing notes.  Pertinent labs & imaging results that were available during my care of the patient were reviewed by me and considered in my medical decision making (see chart for details).     With heme pos stool, pain, and abnormal labs will refer to ER  Final Clinical Impressions(s) / UC Diagnoses   Final diagnoses:  Blood in stool  Non-intractable vomiting, presence of nausea not specified, unspecified vomiting type    Generalized abdominal pain  Hypokalemia     Discharge Instructions     Go to ER   ED Prescriptions    None     Controlled Substance Prescriptions  Controlled Substance Registry consulted? Not Applicable   Raylene Everts, MD 06/14/18 2153

## 2018-06-14 NOTE — ED Triage Notes (Addendum)
Pt cc abdominal pain and vomiting. Pt side abdominal pain. Pt states it feels like a burning in his chest in the center. This started after he ate and went to bed.

## 2018-06-14 NOTE — ED Notes (Signed)
Pt discharged to go to [facility]; pt and wife verbalized understanding of plan

## 2018-06-14 NOTE — ED Triage Notes (Signed)
Pt from [facility] for abd pain, n/v/d x 3 days. Sent here for further evaluation and low potassium. Tachy

## 2018-06-14 NOTE — Discharge Instructions (Addendum)
Go to ER

## 2018-06-15 ENCOUNTER — Encounter (HOSPITAL_COMMUNITY): Admission: EM | Disposition: A | Discharge status: Home / Self Care | Payer: Self-pay | Source: Home / Self Care

## 2018-06-15 ENCOUNTER — Encounter (HOSPITAL_COMMUNITY): Payer: Self-pay | Admitting: Anesthesiology

## 2018-06-15 ENCOUNTER — Inpatient Hospital Stay (HOSPITAL_COMMUNITY): Payer: Medicare HMO | Admitting: Anesthesiology

## 2018-06-15 DIAGNOSIS — G3184 Mild cognitive impairment, so stated: Secondary | ICD-10-CM | POA: Diagnosis present

## 2018-06-15 DIAGNOSIS — I700 Atherosclerosis of aorta: Secondary | ICD-10-CM | POA: Diagnosis present

## 2018-06-15 DIAGNOSIS — Z79899 Other long term (current) drug therapy: Secondary | ICD-10-CM | POA: Diagnosis not present

## 2018-06-15 DIAGNOSIS — K82A1 Gangrene of gallbladder in cholecystitis: Secondary | ICD-10-CM | POA: Diagnosis present

## 2018-06-15 DIAGNOSIS — E785 Hyperlipidemia, unspecified: Secondary | ICD-10-CM | POA: Diagnosis present

## 2018-06-15 DIAGNOSIS — K8000 Calculus of gallbladder with acute cholecystitis without obstruction: Secondary | ICD-10-CM | POA: Diagnosis present

## 2018-06-15 DIAGNOSIS — Z87891 Personal history of nicotine dependence: Secondary | ICD-10-CM | POA: Diagnosis not present

## 2018-06-15 DIAGNOSIS — E876 Hypokalemia: Secondary | ICD-10-CM | POA: Diagnosis present

## 2018-06-15 DIAGNOSIS — G40909 Epilepsy, unspecified, not intractable, without status epilepticus: Secondary | ICD-10-CM | POA: Diagnosis present

## 2018-06-15 DIAGNOSIS — Z9049 Acquired absence of other specified parts of digestive tract: Secondary | ICD-10-CM | POA: Diagnosis present

## 2018-06-15 DIAGNOSIS — I10 Essential (primary) hypertension: Secondary | ICD-10-CM | POA: Diagnosis present

## 2018-06-15 DIAGNOSIS — N179 Acute kidney failure, unspecified: Secondary | ICD-10-CM | POA: Diagnosis present

## 2018-06-15 HISTORY — PX: CHOLECYSTECTOMY: SHX55

## 2018-06-15 LAB — COMPREHENSIVE METABOLIC PANEL
ALBUMIN: 2.8 g/dL — AB (ref 3.5–5.0)
ALT: 27 U/L (ref 0–44)
AST: 39 U/L (ref 15–41)
Alkaline Phosphatase: 83 U/L (ref 38–126)
Anion gap: 14 (ref 5–15)
BUN: 46 mg/dL — AB (ref 8–23)
CO2: 25 mmol/L (ref 22–32)
Calcium: 8.6 mg/dL — ABNORMAL LOW (ref 8.9–10.3)
Chloride: 94 mmol/L — ABNORMAL LOW (ref 98–111)
Creatinine, Ser: 1.81 mg/dL — ABNORMAL HIGH (ref 0.61–1.24)
GFR calc Af Amer: 39 mL/min — ABNORMAL LOW (ref >60)
GFR calc non Af Amer: 34 mL/min — ABNORMAL LOW (ref >60)
Glucose, Bld: 124 mg/dL — ABNORMAL HIGH (ref 70–99)
Potassium: 3 mmol/L — ABNORMAL LOW (ref 3.5–5.1)
Sodium: 133 mmol/L — ABNORMAL LOW (ref 135–145)
Total Bilirubin: 0.8 mg/dL (ref 0.3–1.2)
Total Protein: 7.4 g/dL (ref 6.5–8.1)

## 2018-06-15 LAB — CBC
HCT: 40.7 % (ref 39.0–52.0)
HEMATOCRIT: 40.8 % (ref 39.0–52.0)
Hemoglobin: 13.8 g/dL (ref 13.0–17.0)
Hemoglobin: 13.3 g/dL (ref 13.0–17.0)
MCH: 28.8 pg (ref 26.0–34.0)
MCH: 28.1 pg (ref 26.0–34.0)
MCHC: 33.8 g/dL (ref 30.0–36.0)
MCHC: 32.7 g/dL (ref 30.0–36.0)
MCV: 85 fL (ref 80.0–100.0)
MCV: 86 fL (ref 80.0–100.0)
Platelets: 186 10*3/uL (ref 150–400)
Platelets: 223 10*3/uL (ref 150–400)
RBC: 4.8 MIL/uL (ref 4.22–5.81)
RBC: 4.73 MIL/uL (ref 4.22–5.81)
RDW: 13 % (ref 11.5–15.5)
RDW: 13.3 % (ref 11.5–15.5)
WBC: 20.8 10*3/uL — ABNORMAL HIGH (ref 4.0–10.5)
WBC: 20.4 10*3/uL — ABNORMAL HIGH (ref 4.0–10.5)
nRBC: 0.1 % (ref 0.0–0.2)
nRBC: 0 % (ref 0.0–0.2)

## 2018-06-15 LAB — URINALYSIS, ROUTINE W REFLEX MICROSCOPIC
Bacteria, UA: NONE SEEN
Bilirubin Urine: NEGATIVE
Glucose, UA: NEGATIVE mg/dL
Ketones, ur: NEGATIVE mg/dL
Nitrite: NEGATIVE
Protein, ur: 100 mg/dL — AB
Specific Gravity, Urine: >1.046 — ABNORMAL HIGH (ref 1.005–1.030)
pH: 9 — ABNORMAL HIGH (ref 5.0–8.0)

## 2018-06-15 LAB — MAGNESIUM: Magnesium: 2.4 mg/dL (ref 1.7–2.4)

## 2018-06-15 LAB — SURGICAL PCR SCREEN
MRSA, PCR: NEGATIVE
Staphylococcus aureus: NEGATIVE

## 2018-06-15 SURGERY — LAPAROSCOPIC CHOLECYSTECTOMY
Anesthesia: General | Site: Abdomen

## 2018-06-15 MED ORDER — AMLODIPINE BESYLATE 10 MG PO TABS
10.0000 mg | ORAL_TABLET | Freq: Every day | ORAL | Status: DC
  Administered 2018-06-16 – 2018-06-18 (×3): 10 mg via ORAL
  Filled 2018-06-15 (×3): qty 1

## 2018-06-15 MED ORDER — SUGAMMADEX SODIUM 200 MG/2ML IV SOLN
INTRAVENOUS | Status: DC | PRN
  Administered 2018-06-15: 200 mg via INTRAVENOUS

## 2018-06-15 MED ORDER — ACETAMINOPHEN 650 MG RE SUPP: 650.0000 mg | Freq: Four times a day (QID) | RECTAL | Status: DC | PRN

## 2018-06-15 MED ORDER — PHENYLEPHRINE 40 MCG/ML (10ML) SYRINGE FOR IV PUSH (FOR BLOOD PRESSURE SUPPORT)
PREFILLED_SYRINGE | INTRAVENOUS | Status: AC
  Filled 2018-06-15: qty 10

## 2018-06-15 MED ORDER — DEXAMETHASONE SODIUM PHOSPHATE 10 MG/ML IJ SOLN
INTRAMUSCULAR | Status: DC | PRN
  Administered 2018-06-15: 8 mg via INTRAVENOUS

## 2018-06-15 MED ORDER — FENTANYL CITRATE (PF) 250 MCG/5ML IJ SOLN
INTRAMUSCULAR | Status: AC
  Filled 2018-06-15: qty 5

## 2018-06-15 MED ORDER — ACETAMINOPHEN 10 MG/ML IV SOLN: 1000.0000 mg | Freq: Once | INTRAVENOUS | Status: DC | PRN

## 2018-06-15 MED ORDER — PIPERACILLIN-TAZOBACTAM 3.375 G IVPB
3.3750 g | Freq: Three times a day (TID) | INTRAVENOUS | Status: DC
  Administered 2018-06-15 – 2018-06-18 (×10): 3.375 g via INTRAVENOUS
  Filled 2018-06-15 (×14): qty 50

## 2018-06-15 MED ORDER — DONEPEZIL HCL 5 MG PO TABS
5.0000 mg | ORAL_TABLET | Freq: Every day | ORAL | Status: DC
  Administered 2018-06-15 – 2018-06-17 (×3): 5 mg via ORAL
  Filled 2018-06-15 (×5): qty 1

## 2018-06-15 MED ORDER — ENOXAPARIN SODIUM 30 MG/0.3ML ~~LOC~~ SOLN: 30.0000 mg | SUBCUTANEOUS | Status: DC

## 2018-06-15 MED ORDER — FENTANYL CITRATE (PF) 100 MCG/2ML IJ SOLN: 25.0000 ug | INTRAMUSCULAR | Status: DC | PRN

## 2018-06-15 MED ORDER — BUPIVACAINE-EPINEPHRINE (PF) 0.25% -1:200000 IJ SOLN
INTRAMUSCULAR | Status: AC
  Filled 2018-06-15: qty 30

## 2018-06-15 MED ORDER — HEMOSTATIC AGENTS (NO CHARGE) OPTIME
TOPICAL | Status: DC | PRN
  Administered 2018-06-15: 1 via TOPICAL

## 2018-06-15 MED ORDER — BUPIVACAINE-EPINEPHRINE 0.25% -1:200000 IJ SOLN
INTRAMUSCULAR | Status: DC | PRN
  Administered 2018-06-15: 30 mL

## 2018-06-15 MED ORDER — SODIUM CHLORIDE 0.9 % IV SOLN
INTRAVENOUS | Status: DC | PRN
  Administered 2018-06-15: 30 ug/min via INTRAVENOUS

## 2018-06-15 MED ORDER — ONDANSETRON HCL 4 MG/2ML IJ SOLN
4.0000 mg | Freq: Four times a day (QID) | INTRAMUSCULAR | Status: DC | PRN
  Administered 2018-06-15: 4 mg via INTRAVENOUS

## 2018-06-15 MED ORDER — LACTATED RINGERS IV SOLN
INTRAVENOUS | Status: DC
  Administered 2018-06-15: 09:00:00 via INTRAVENOUS

## 2018-06-15 MED ORDER — LIDOCAINE 2% (20 MG/ML) 5 ML SYRINGE
INTRAMUSCULAR | Status: AC
  Filled 2018-06-15: qty 5

## 2018-06-15 MED ORDER — ROCURONIUM BROMIDE 50 MG/5ML IV SOSY
PREFILLED_SYRINGE | INTRAVENOUS | Status: AC
  Filled 2018-06-15: qty 5

## 2018-06-15 MED ORDER — ACETAMINOPHEN 325 MG PO TABS: 650.0000 mg | ORAL_TABLET | Freq: Four times a day (QID) | ORAL | Status: DC | PRN

## 2018-06-15 MED ORDER — ONDANSETRON 4 MG PO TBDP: 4.0000 mg | ORAL_TABLET | Freq: Four times a day (QID) | ORAL | Status: DC | PRN

## 2018-06-15 MED ORDER — ONDANSETRON HCL 4 MG/2ML IJ SOLN: 4.0000 mg | Freq: Once | INTRAMUSCULAR | Status: DC | PRN

## 2018-06-15 MED ORDER — ROCURONIUM BROMIDE 10 MG/ML (PF) SYRINGE
PREFILLED_SYRINGE | INTRAVENOUS | Status: DC | PRN
  Administered 2018-06-15: 50 mg via INTRAVENOUS
  Administered 2018-06-15: 10 mg via INTRAVENOUS

## 2018-06-15 MED ORDER — PROPOFOL 10 MG/ML IV BOLUS
INTRAVENOUS | Status: AC
  Filled 2018-06-15: qty 20

## 2018-06-15 MED ORDER — LEVETIRACETAM 500 MG PO TABS
500.0000 mg | ORAL_TABLET | Freq: Two times a day (BID) | ORAL | Status: DC
  Administered 2018-06-15 – 2018-06-18 (×6): 500 mg via ORAL
  Filled 2018-06-15 (×7): qty 1

## 2018-06-15 MED ORDER — ONDANSETRON HCL 4 MG/2ML IJ SOLN
INTRAMUSCULAR | Status: AC
  Filled 2018-06-15: qty 2

## 2018-06-15 MED ORDER — SUCCINYLCHOLINE CHLORIDE 200 MG/10ML IV SOSY
PREFILLED_SYRINGE | INTRAVENOUS | Status: AC
  Filled 2018-06-15: qty 10

## 2018-06-15 MED ORDER — POTASSIUM CHLORIDE CRYS ER 20 MEQ PO TBCR
20.0000 meq | EXTENDED_RELEASE_TABLET | Freq: Once | ORAL | Status: AC
  Administered 2018-06-15: 20 meq via ORAL
  Filled 2018-06-15: qty 1

## 2018-06-15 MED ORDER — SIMETHICONE 80 MG PO CHEW
40.0000 mg | CHEWABLE_TABLET | Freq: Four times a day (QID) | ORAL | Status: DC | PRN
  Administered 2018-06-16: 40 mg via ORAL
  Filled 2018-06-15 (×2): qty 1

## 2018-06-15 MED ORDER — DIPHENHYDRAMINE HCL 50 MG/ML IJ SOLN: 12.5000 mg | Freq: Four times a day (QID) | INTRAMUSCULAR | Status: DC | PRN

## 2018-06-15 MED ORDER — DEXAMETHASONE SODIUM PHOSPHATE 10 MG/ML IJ SOLN
INTRAMUSCULAR | Status: AC
  Filled 2018-06-15: qty 1

## 2018-06-15 MED ORDER — EPHEDRINE 5 MG/ML INJ
INTRAVENOUS | Status: AC
  Filled 2018-06-15: qty 10

## 2018-06-15 MED ORDER — PHENYLEPHRINE 40 MCG/ML (10ML) SYRINGE FOR IV PUSH (FOR BLOOD PRESSURE SUPPORT)
PREFILLED_SYRINGE | INTRAVENOUS | Status: DC | PRN
  Administered 2018-06-15: 80 ug via INTRAVENOUS

## 2018-06-15 MED ORDER — LIDOCAINE 2% (20 MG/ML) 5 ML SYRINGE
INTRAMUSCULAR | Status: DC | PRN
  Administered 2018-06-15: 100 mg via INTRAVENOUS

## 2018-06-15 MED ORDER — HYDROCODONE-ACETAMINOPHEN 5-325 MG PO TABS: 1.0000 | ORAL_TABLET | ORAL | Status: DC | PRN

## 2018-06-15 MED ORDER — FENTANYL CITRATE (PF) 250 MCG/5ML IJ SOLN
INTRAMUSCULAR | Status: DC | PRN
  Administered 2018-06-15 (×2): 50 ug via INTRAVENOUS

## 2018-06-15 MED ORDER — 0.9 % SODIUM CHLORIDE (POUR BTL) OPTIME
TOPICAL | Status: DC | PRN
  Administered 2018-06-15: 1000 mL

## 2018-06-15 MED ORDER — DIPHENHYDRAMINE HCL 12.5 MG/5ML PO ELIX
12.5000 mg | ORAL_SOLUTION | Freq: Four times a day (QID) | ORAL | Status: DC | PRN
  Filled 2018-06-15: qty 5

## 2018-06-15 MED ORDER — KCL IN DEXTROSE-NACL 20-5-0.45 MEQ/L-%-% IV SOLN
INTRAVENOUS | Status: DC
  Administered 2018-06-15 – 2018-06-16 (×2): via INTRAVENOUS
  Filled 2018-06-15 (×2): qty 1000

## 2018-06-15 MED ORDER — MORPHINE SULFATE (PF) 2 MG/ML IV SOLN: 1.0000 mg | INTRAVENOUS | Status: DC | PRN

## 2018-06-15 MED ORDER — PROPOFOL 10 MG/ML IV BOLUS
INTRAVENOUS | Status: DC | PRN
  Administered 2018-06-15: 140 mg via INTRAVENOUS

## 2018-06-15 MED ORDER — SODIUM CHLORIDE 0.9 % IR SOLN
Status: DC | PRN
  Administered 2018-06-15: 1000 mL

## 2018-06-15 SURGICAL SUPPLY — 44 items
BAG SPEC RTRVL 10 TROC 200 (ENDOMECHANICALS) ×1
BLADE CLIPPER SURG (BLADE) IMPLANT
CANISTER SUCT 3000ML PPV (MISCELLANEOUS) ×3 IMPLANT
CHLORAPREP W/TINT 26ML (MISCELLANEOUS) ×3 IMPLANT
CLIP LIGATING HEMO O LOK GREEN (MISCELLANEOUS) ×3 IMPLANT
CLIP VESOLOCK XL 6/CT (CLIP) ×6 IMPLANT
COVER SURGICAL LIGHT HANDLE (MISCELLANEOUS) ×3 IMPLANT
COVER WAND RF STERILE (DRAPES) ×3 IMPLANT
DERMABOND ADVANCED (GAUZE/BANDAGES/DRESSINGS) ×2
DERMABOND ADVANCED .7 DNX12 (GAUZE/BANDAGES/DRESSINGS) ×1 IMPLANT
DRAIN CHANNEL 19F RND (DRAIN) ×3 IMPLANT
ELECT REM PT RETURN 9FT ADLT (ELECTROSURGICAL) ×3
ELECTRODE REM PT RTRN 9FT ADLT (ELECTROSURGICAL) ×1 IMPLANT
EVACUATOR SILICONE 100CC (DRAIN) ×3 IMPLANT
GLOVE BIO SURGEON STRL SZ 6.5 (GLOVE) ×2 IMPLANT
GLOVE BIO SURGEONS STRL SZ 6.5 (GLOVE) ×1
GLOVE BIOGEL PI IND STRL 7.0 (GLOVE) ×1 IMPLANT
GLOVE BIOGEL PI INDICATOR 7.0 (GLOVE) ×2
GLOVE SURG SS PI 7.0 STRL IVOR (GLOVE) ×3 IMPLANT
GOWN STRL REUS W/ TWL LRG LVL3 (GOWN DISPOSABLE) ×3 IMPLANT
GOWN STRL REUS W/TWL LRG LVL3 (GOWN DISPOSABLE) ×6
GRASPER SUT TROCAR 14GX15 (MISCELLANEOUS) ×3 IMPLANT
HEMOSTAT SNOW SURGICEL 2X4 (HEMOSTASIS) ×3 IMPLANT
KIT BASIN OR (CUSTOM PROCEDURE TRAY) ×3 IMPLANT
KIT TURNOVER KIT B (KITS) ×3 IMPLANT
NEEDLE 22X1 1/2 (OR ONLY) (NEEDLE) ×3 IMPLANT
NS IRRIG 1000ML POUR BTL (IV SOLUTION) ×3 IMPLANT
PAD ARMBOARD 7.5X6 YLW CONV (MISCELLANEOUS) ×3 IMPLANT
POUCH RETRIEVAL ECOSAC 10 (ENDOMECHANICALS) ×1 IMPLANT
POUCH RETRIEVAL ECOSAC 10MM (ENDOMECHANICALS) ×2
SCISSORS LAP 5X35 DISP (ENDOMECHANICALS) ×3 IMPLANT
SET IRRIG TUBING LAPAROSCOPIC (IRRIGATION / IRRIGATOR) ×3 IMPLANT
SLEEVE ENDOPATH XCEL 5M (ENDOMECHANICALS) ×6 IMPLANT
SPECIMEN JAR SMALL (MISCELLANEOUS) ×3 IMPLANT
SUT ETHILON 2 0 FS 18 (SUTURE) ×3 IMPLANT
SUT MNCRL AB 4-0 PS2 18 (SUTURE) ×3 IMPLANT
SUT PROLENE 4 0 SH DA (SUTURE) ×3 IMPLANT
TOWEL OR 17X24 6PK STRL BLUE (TOWEL DISPOSABLE) ×3 IMPLANT
TOWEL OR 17X26 10 PK STRL BLUE (TOWEL DISPOSABLE) ×3 IMPLANT
TRAY LAPAROSCOPIC MC (CUSTOM PROCEDURE TRAY) ×3 IMPLANT
TROCAR XCEL 12X100 BLDLESS (ENDOMECHANICALS) ×3 IMPLANT
TROCAR XCEL NON-BLD 5MMX100MML (ENDOMECHANICALS) ×3 IMPLANT
TUBING INSUFFLATION (TUBING) ×3 IMPLANT
WATER STERILE IRR 1000ML POUR (IV SOLUTION) ×3 IMPLANT

## 2018-06-15 NOTE — Transfer of Care (Signed)
Immediate Anesthesia Transfer of Care Note  Patient: Isaiah Davidson  Procedure(s) Performed: LAPAROSCOPIC CHOLECYSTECTOMY (N/A Abdomen)  Patient Location: PACU  Anesthesia Type:General  Level of Consciousness: awake, alert  and oriented  Airway & Oxygen Therapy: Patient Spontanous Breathing and Patient connected to nasal cannula oxygen  Post-op Assessment: Report given to RN and Post -op Vital signs reviewed and stable  Post vital signs: Reviewed and stable  Last Vitals:  Vitals Value Taken Time  BP 129/74 06/15/2018 12:16 PM  Temp 36.9 C 06/15/2018 12:16 PM  Pulse 85 06/15/2018 12:18 PM  Resp 18 06/15/2018 12:18 PM  SpO2 95 % 06/15/2018 12:18 PM  Vitals shown include unvalidated device data.  Last Pain:  Vitals:   06/15/18 1216  TempSrc:   PainSc: 0-No pain         Complications: No apparent anesthesia complications

## 2018-06-15 NOTE — Progress Notes (Signed)
Arrived from ED. Alert and oriented. Denies any pain. Call light within reach. 

## 2018-06-15 NOTE — Anesthesia Procedure Notes (Signed)
Procedure Name: Intubation Date/Time: 06/15/2018 10:26 AM Performed by: Marsa Aris, CRNA Pre-anesthesia Checklist: Patient identified, Emergency Drugs available, Suction available and Patient being monitored Patient Re-evaluated:Patient Re-evaluated prior to induction Oxygen Delivery Method: Circle System Utilized Preoxygenation: Pre-oxygenation with 100% oxygen Induction Type: IV induction Ventilation: Mask ventilation without difficulty Laryngoscope Size: Miller and 2 Grade View: Grade I Tube type: Oral Number of attempts: 1 Airway Equipment and Method: Stylet and Oral airway Placement Confirmation: ETT inserted through vocal cords under direct vision,  positive ETCO2 and breath sounds checked- equal and bilateral Secured at: 22 cm Tube secured with: Tape Dental Injury: Teeth and Oropharynx as per pre-operative assessment

## 2018-06-15 NOTE — Op Note (Signed)
PATIENT:  Isaiah Davidson  84 y.o. male  PRE-OPERATIVE DIAGNOSIS:  cholecystitis.  POST-OPERATIVE DIAGNOSIS:  cholecystitis  PROCEDURE:  Procedure(s): LAPAROSCOPIC CHOLECYSTECTOMY   SURGEON:  Surgeon(s): Kinsinger, Arta Bruce, MD  ASSISTANT: Jackson Latino, PA  ANESTHESIA:   local and general  Indications for procedure: Isaiah Davidson is a 84 y.o. male with symptoms of Abdominal pain and Nausea and vomiting consistent with gallbladder disease, Confirmed by Ultrasound and CT.  Description of procedure: The patient was brought into the operative suite, placed supine. Anesthesia was administered with endotracheal tube. Patient was strapped in place and foot board was secured. All pressure points were offloaded by foam padding. The patient was prepped and draped in the usual sterile fashion.  A small incision was made to the right of the umbilicus. A 35mm trocar was inserted into the peritoneal cavity with optical entry. Pneumoperitoneum was applied with high flow low pressure. 2 70mm trocars were placed in the RUQ. A 45mm trocar was placed in the subxiphoid space. Marcaine was infused to the subxiphoid space and lateral upper right abdomen in the transversus abdominis plane. Next the patient was placed in reverse trendelenberg. The gallbladder was distended, tense and acutely inflamed. A suction device was used to drain the gallbladder for retraction and a large amount of pus was evacuated from the gallbladder.  The gallbladder was retracted cephalad and lateral. The peritoneum was reflected off the infundibulum working lateral to medial. The cystic duct and cystic artery were identified and further dissection revealed a critical view. There was one bleeding area just above the cystic artery that was difficult to control. The cystic duct and cystic artery were doubly clipped and ligated.   The gallbladder was removed off the liver bed with cautery. The Gallbladder was placed in a specimen bag. The  gallbladder fossa was irrigated and hemostasis was applied with cautery. The gallbladder was removed via the 82mm trocar. On reevaluation of the area, the previous bleeding area stopped with pressure and a snow packing was put in place. Multiple stones were removed from the RUQ. Large amount of irrigation was used. The fascial defect was closed with interrupted 0 vicryl suture via laparoscopic trans-fascial suture passer. A 19 fr blake drain was placed into the gallbladder fossa due to the level of inflammation. On reevaluation of the snow/bleeding area, no active bleeding was seen. Pneumoperitoneum was removed, all trocar were removed. All incisions were closed with 4-0 monocryl subcuticular stitch. The patient woke from anesthesia and was brought to PACU in stable condition. All counts were correct  Findings: gangrenous cholecystitis  Specimen: gallbladder  Blood loss: 200 ml  Local anesthesia: 30 ml marcaine  Complications: increased intraoperative bleeding, controlled with pressure and snow packing  PLAN OF CARE: Admit to inpatient   PATIENT DISPOSITION:  PACU - hemodynamically stable.  Images:      Gurney Maxin, M.D. General, Bariatric, & Minimally Invasive Surgery [name] [place]

## 2018-06-15 NOTE — Progress Notes (Signed)
Pt transported to OR; consent signed, CHG bath and PCR completed.  Pt spoke with wife prior to transport.

## 2018-06-15 NOTE — Anesthesia Preprocedure Evaluation (Signed)
Anesthesia Evaluation  Patient identified by MRN, date of birth, ID band Patient awake    Reviewed: Allergy & Precautions, H&P , NPO status , Patient's Chart, lab work & pertinent test results  Airway Mallampati: I  TM Distance: >3 FB Neck ROM: Full    Dental no notable dental hx. (+) Edentulous Upper, Edentulous Lower   Pulmonary shortness of breath, former smoker,    Pulmonary exam normal breath sounds clear to auscultation       Cardiovascular hypertension, Pt. on medications negative cardio ROS Normal cardiovascular exam Rhythm:Regular Rate:Normal     Neuro/Psych Seizures -,  negative psych ROS   GI/Hepatic negative GI ROS, Neg liver ROS,   Endo/Other  negative endocrine ROS  Renal/GU negative Renal ROS     Musculoskeletal negative musculoskeletal ROS (+)   Abdominal   Peds  Hematology negative hematology ROS (+)   Anesthesia Other Findings   Reproductive/Obstetrics                            Lab Results  Component Value Date   WBC 20.8 (H) 06/15/2018   HGB 13.8 06/15/2018   HCT 40.8 06/15/2018   MCV 85.0 06/15/2018   PLT 186 06/15/2018    Anesthesia Physical Anesthesia Plan  ASA: III  Anesthesia Plan: General   Post-op Pain Management:    Induction: Intravenous  PONV Risk Score and Plan: 2 and Treatment may vary due to age or medical condition, Dexamethasone, Ondansetron and Midazolam  Airway Management Planned: Oral ETT  Additional Equipment:   Intra-op Plan:   Post-operative Plan: Extubation in OR  Informed Consent: I have reviewed the patients History and Physical, chart, labs and discussed the procedure including the risks, benefits and alternatives for the proposed anesthesia with the patient or authorized representative who has indicated his/her understanding and acceptance.   Dental advisory given  Plan Discussed with:   Anesthesia Plan Comments:          Anesthesia Quick Evaluation

## 2018-06-15 NOTE — Progress Notes (Signed)
Pre Procedure note for inpatients:   Isaiah Davidson has been scheduled for Procedure(s): LAPAROSCOPIC CHOLECYSTECTOMY (N/A) today. The various methods of treatment have been discussed with the patient. After consideration of the risks, benefits and treatment options the patient has consented to the planned procedure.   The patient has been seen and labs reviewed. There are no changes in the patient's condition to prevent proceeding with the planned procedure today.  Recent labs:  Lab Results  Component Value Date   WBC 20.8 (H) 06/15/2018   HGB 13.8 06/15/2018   HCT 40.8 06/15/2018   PLT 186 06/15/2018   GLUCOSE 124 (H) 06/15/2018   CHOL 220 (H) 01/18/2018   TRIG 212.0 (H) 01/18/2018   HDL 52.50 01/18/2018   LDLDIRECT 97.0 01/18/2018   LDLCALC 93 09/24/2013   ALT 27 06/15/2018   AST 39 06/15/2018   NA 133 (L) 06/15/2018   K 3.0 (L) 06/15/2018   CL 94 (L) 06/15/2018   CREATININE 1.81 (H) 06/15/2018   BUN 46 (H) 06/15/2018   CO2 25 06/15/2018   TSH 1.30 10/01/2012   PSA historical 12/09/2001   INR 1.0 02/20/2009   HGBA1C  02/23/2009    5.6 (NOTE) The ADA recommends the following therapeutic goal for glycemic control related to Hgb A1c measurement: Goal of therapy: <6.5 Hgb A1c  Reference: American Diabetes Association: Clinical Practice Recommendations 2010, Diabetes Care, 2010, 33: (Suppl  1).    Mickeal Skinner, MD 06/15/2018 8:22 AM

## 2018-06-15 NOTE — Progress Notes (Signed)
Pt returned from PACU at this time.  VSS; Alert and oriented.  Denies any pain at present.  JP drain charged and draining small about of serosanguinous fluid.  Three lap sites WNL.

## 2018-06-15 NOTE — Anesthesia Postprocedure Evaluation (Signed)
Anesthesia Post Note  Patient: Isaiah Davidson  Procedure(s) Performed: LAPAROSCOPIC CHOLECYSTECTOMY (N/A Abdomen)     Patient location during evaluation: PACU Anesthesia Type: General Level of consciousness: awake and alert Pain management: pain level controlled Vital Signs Assessment: post-procedure vital signs reviewed and stable Respiratory status: spontaneous breathing, nonlabored ventilation, respiratory function stable and patient connected to nasal cannula oxygen Cardiovascular status: blood pressure returned to baseline and stable Postop Assessment: no apparent nausea or vomiting Anesthetic complications: no    Last Vitals:  Vitals:   06/15/18 1246 06/15/18 1310  BP: 126/78 128/79  Pulse: 81 85  Resp: 18 18  Temp:  37.1 C  SpO2: 96% 93%    Last Pain:  Vitals:   06/15/18 1310  TempSrc: Oral  PainSc: 0-No pain                 Barnet Glasgow

## 2018-06-15 NOTE — H&P (Signed)
Isaiah Davidson is an 84 y.o. male.   Chief Complaint: abd pain/n/v HPI: 84 yo AAM with HTN, HPL, sz disorder comes to ED with c/o n/v/abd pain since this past Tuesday. Symptoms started Tues. Mainly c/o is ongoing vomiting. Pain intermittent. Also has had poor appetite since this started. Went to UC earlier and was directed to ED. No melena, hematochezia, acholic stools. No NSAID use. Maybe something similar a few years ago but not as bad.   Past Medical History:  Diagnosis Date  . HYPERLIPIDEMIA 02/22/2007  . HYPERTENSION 02/22/2007  . MCI (mild cognitive impairment) with memory loss 05/14/2014  . Seizures ([place])    treated for siezures around 2010    Past Surgical History:  Procedure Laterality Date  . CARPAL TUNNEL RELEASE    . KNEE SURGERY     arthroscopic    Family History  Problem Relation Age of Onset  . Diabetes Paternal Grandmother        raised by grandparents   Social History:  reports that he quit smoking about 39 years ago. His smoking use included cigars and cigarettes. He has a 30.00 pack-year smoking history. He has never used smokeless tobacco. He reports that he does not drink alcohol or use drugs.  Allergies: No Known Allergies   (Not in a hospital admission)  Results for orders placed or performed during the hospital encounter of 06/14/18 (from the past 48 hour(s))  Lipase, blood     Status: None   Collection Time: 06/14/18  7:33 PM  Result Value Ref Range   Lipase 40 11 - 51 U/L    Comment: Performed at [facility], [address] [place], [place] [address]  Comprehensive metabolic panel     Status: Abnormal   Collection Time: 06/14/18  7:33 PM  Result Value Ref Range   Sodium 131 (L) 135 - 145 mmol/L   Potassium 3.3 (L) 3.5 - 5.1 mmol/L   Chloride 90 (L) 98 - 111 mmol/L   CO2 25 22 - 32 mmol/L   Glucose, Bld 133 (H) 70 - 99 mg/dL   BUN 44 (H) 8 - 23 mg/dL   Creatinine, Ser 1.82 (H) 0.61 - 1.24 mg/dL   Calcium 9.0 8.9 - 10.3 mg/dL   Total  Protein 7.8 6.5 - 8.1 g/dL   Albumin 3.1 (L) 3.5 - 5.0 g/dL   AST 49 (H) 15 - 41 U/L   ALT 28 0 - 44 U/L   Alkaline Phosphatase 86 38 - 126 U/L   Total Bilirubin 1.0 0.3 - 1.2 mg/dL   GFR calc non Af Amer 33 (L) >60 mL/min   GFR calc Af Amer 39 (L) >60 mL/min   Anion gap 16 (H) 5 - 15    Comment: Performed at [facility], [place] [address], [place] [address]  CBC     Status: Abnormal   Collection Time: 06/14/18  7:33 PM  Result Value Ref Range   WBC 21.5 (H) 4.0 - 10.5 K/uL   RBC 4.98 4.22 - 5.81 MIL/uL   Hemoglobin 14.2 13.0 - 17.0 g/dL   HCT 42.9 39.0 - 52.0 %   MCV 86.1 80.0 - 100.0 fL   MCH 28.5 26.0 - 34.0 pg   MCHC 33.1 30.0 - 36.0 g/dL   RDW 13.1 11.5 - 15.5 %   Platelets 194 150 - 400 K/uL   nRBC 0.0 0.0 - 0.2 %    Comment: Performed at [facility],  [address], [place], [place] [address]  Urinalysis, Routine w reflex microscopic     Status: Abnormal   Collection Time: 06/15/18 12:02 AM  Result Value Ref Range   Color, Urine YELLOW YELLOW   APPearance HAZY (A) CLEAR   Specific Gravity, Urine >1.046 (H) 1.005 - 1.030   pH 9.0 (H) 5.0 - 8.0   Glucose, UA NEGATIVE NEGATIVE mg/dL   Hgb urine dipstick SMALL (A) NEGATIVE   Bilirubin Urine NEGATIVE NEGATIVE   Ketones, ur NEGATIVE NEGATIVE mg/dL   Protein, ur 100 (A) NEGATIVE mg/dL   Nitrite NEGATIVE NEGATIVE   Leukocytes, UA MODERATE (A) NEGATIVE   RBC / HPF 0-5 0 - 5 RBC/hpf   WBC, UA 11-20 0 - 5 WBC/hpf   Bacteria, UA NONE SEEN NONE SEEN   Mucus PRESENT    Hyaline Casts, UA PRESENT    Triple Phosphate Crystal PRESENT     Comment: Performed at [facility] [address] [place], [place] [address]   Dg Chest 2 View  Result Date: 06/14/2018 CLINICAL DATA:  Abdominal pain, nausea, vomiting and diarrhea for the past 3 days. Ex-smoker. EXAM: CHEST - 2 VIEW COMPARISON:  06/09/2016. FINDINGS: Normal sized heart. The previously demonstrated left diaphragmatic eventration is smaller. Interval  linear densities at the right lung base and increased linear densities at the left lung base. Mild peribronchial thickening. Mild bilateral shoulder degenerative changes. IMPRESSION: Interval bibasilar linear atelectasis or scarring and mild bronchitic changes. Electronically Signed   By: Claudie Revering M.D.   On: 06/14/2018 19:22   Ct Abdomen Pelvis W Contrast  Result Date: 06/14/2018 CLINICAL DATA:  Abdominal pain EXAM: CT ABDOMEN AND PELVIS WITH CONTRAST TECHNIQUE: Multidetector CT imaging of the abdomen and pelvis was performed using the standard protocol following bolus administration of intravenous contrast. CONTRAST:  157mL OMNIPAQUE IOHEXOL 300 MG/ML  SOLN COMPARISON:  Ultrasound 01/28/2015.  CT 10/07/2009. FINDINGS: Lower chest: Linear scarring or atelectasis in the lung bases. Cardiomegaly. Hepatobiliary: Scattered cysts within the liver, stable. Gallbladder is moderately distended. Small stones or polyps noted along the gallbladder wall. Slight inflammation noted around the gallbladder. : No focal abnormality or ductal dilatation. Spleen: No focal abnormality.  Normal size. Adrenals/Urinary Tract: Multiple bilateral renal cysts. There is a new 1.8 cm low-density lesion in the mid to lower pole of the left kidney on image 32 which appears to demonstrate some internal enhancement concerning for a solid renal lesion. No hydronephrosis. Adrenal glands and urinary bladder unremarkable. Stomach/Bowel: Sigmoid diverticulosis. No active diverticulitis. Appendix is normal. No evidence of bowel obstruction. Vascular/Lymphatic: Aortic atherosclerosis. No enlarged abdominal or pelvic lymph nodes. Reproductive: No visible focal abnormality. Other: No free fluid or free air. Musculoskeletal: No acute bony abnormality. Diffuse degenerative disc and facet disease in the lumbar spine. IMPRESSION: Mildly distended gallbladder. There are noncalcified structures along the gallbladder wall which could reflect gallstones or  polyps. There is mild stranding/inflammation around the pancreas. Cannot exclude cholecystitis. New small low-density lesion in the mid to lower pole of the left kidney measures 1.8 cm. Suggestion of possible internal enhancement. Cannot exclude solid enhancing lesion. This could be further evaluated with elective MRI if felt clinically indicated. Multiple bilateral renal cysts. Aortic atherosclerosis. Sigmoid diverticulosis. Bibasilar scarring or atelectasis.  Cardiomegaly. Electronically Signed   By: Rolm Baptise M.D.   On: 06/14/2018 21:48   US Abdomen Limited  Result Date: 06/14/2018 CLINICAL DATA:  Initial evaluation for acute abdominal pain, nausea, vomiting, diarrhea. EXAM: ULTRASOUND ABDOMEN LIMITED  RIGHT UPPER QUADRANT COMPARISON:  Prior CT from earlier the same day. FINDINGS: Gallbladder: Gallbladder distended with heterogeneous echogenic material within the gallbladder lumen, likely sludge and/or sludge balls. 1.8 cm hyperechoic structure with posterior acoustic shadowing likely reflects a superimposed stone. Gallbladder wall thickened up to 7.2 mm with mild mural edema. No free pericholecystic fluid. No sonographic Murphy sign elicited on exam. Common bile duct: Diameter: 5.7 mm Liver: 1.5 cm cyst noted within the left hepatic lobe. Additional previously seen liver cysts not well visualized by sonography. Mildly increased echotexture of the hepatic parenchyma suggesting mild steatosis. Portal vein not assessed on this examination. IMPRESSION: 1. Heterogeneous echogenic and hyperechoic material within the gallbladder lumen, most likely reflecting stones and/or sludge. Possible gallbladder mass not entirely excluded, although felt to be less likely. Finding could be further assessed with dedicated MRI/MRCP for further evaluation as warranted. 2. Associated gallbladder wall thickening up to 7 mm. Clinical correlation for possible acute cholecystitis recommended. 3. No biliary dilatation. Electronically  Signed   By: Jeannine Boga M.D.   On: 06/14/2018 23:58    Review of Systems  Constitutional: Positive for chills (subjective) and fever (subjective).  Respiratory: Negative for cough and shortness of breath.        Some DOE past several months  Cardiovascular: Negative for chest pain, palpitations, orthopnea, claudication and leg swelling.  Gastrointestinal: Positive for abdominal pain, nausea and vomiting.  All other systems reviewed and are negative.   Blood pressure (!) 143/94, pulse 89, temperature 99.2 F (37.3 C), temperature source Oral, resp. rate (!) 21, SpO2 93 %. Physical Exam  Vitals reviewed. Constitutional: He is oriented to person, place, and time. He appears well-developed and well-nourished. No distress.  Elderly AAM resting comfortably; nontoxic  HENT:  Head: Normocephalic and atraumatic.  Right Ear: External ear normal.  Left Ear: External ear normal.  Eyes: Conjunctivae are normal. No scleral icterus.  Neck: Normal range of motion. Neck supple. No tracheal deviation present. No thyromegaly present.  Cardiovascular: Normal rate and normal heart sounds.  Respiratory: Effort normal and breath sounds normal. No stridor. No respiratory distress. He has no wheezes.  GI: Soft. There is tenderness. There is no rebound and no guarding.  Soft, mild RUQ TTP, a little distended. No peritonitis/guarding  Musculoskeletal: He exhibits no edema or tenderness.  Lymphadenopathy:    He has no cervical adenopathy.  Neurological: He is alert and oriented to person, place, and time. He exhibits normal muscle tone.  Skin: Skin is warm and dry. No rash noted. He is not diaphoretic. No erythema. No pallor.  Psychiatric: He has a normal mood and affect. His behavior is normal. Judgment and thought content normal.     Assessment/Plan Nausea/vomiting/abd pain Acute calculous cholecystitis AKI secondary to vomiting HTN Seizure disorder Hypokalemia  Replace potassium Start  IV abx for cholecystitis IVF for hydration Cont home seizure med and BP med Plan Lap Chole later today - will discuss surgery with pt and family later this am. Family has left for the night.  Repeat labs in AM  [place]el[place]. Redmond Pulling, MD, FACS General, Bariatric, & Minimally Invasive Surgery [facility], [place]   Greer Pickerel, MD 06/15/2018, 2:02 AM

## 2018-06-15 NOTE — Discharge Instructions (Signed)
[facility] LAPAROSCOPIC SURGERY: POST OP INSTRUCTIONS Always review your discharge instruction sheet given to you by the facility where your surgery was performed. IF YOU HAVE DISABILITY OR FAMILY LEAVE FORMS, YOU MUST BRING THEM TO THE OFFICE FOR PROCESSING.   DO NOT GIVE THEM TO YOUR DOCTOR.  PAIN CONTROL  1. First take acetaminophen (Tylenol) AND/or ibuprofen (Advil) to control your pain after surgery.  Follow directions on package.  Taking acetaminophen (Tylenol) and/or ibuprofen (Advil) regularly after surgery will help to control your pain and lower the amount of prescription pain medication you may need.  You should not take more than 3,000 mg (3 grams) of acetaminophen (Tylenol) in 24 hours.  You should not take ibuprofen (Advil), aleve, motrin, naprosyn or other NSAIDS if you have a history of stomach ulcers or chronic kidney disease.  2. A prescription for pain medication may be given to you upon discharge.  Take your pain medication as prescribed, if you still have uncontrolled pain after taking acetaminophen (Tylenol) or ibuprofen (Advil). 3. Use ice packs to help control pain. 4. If you need a refill on your pain medication, please contact your pharmacy.  They will contact our office to request authorization. Prescriptions will not be filled after 5pm or on week-ends.  HOME MEDICATIONS 5. Take your usually prescribed medications unless otherwise directed.  DIET 6. You should follow a light diet the first few days after arrival home.  Be sure to include lots of fluids daily. Avoid fatty, fried foods.   CONSTIPATION 7. It is common to experience some constipation after surgery and if you are taking pain medication.  Increasing fluid intake and taking a stool softener (such as Colace) will usually help or prevent this problem from occurring.  A mild laxative (Milk of Magnesia or Miralax) should be taken according to package instructions if there are no bowel  movements after 48 hours.  WOUND/INCISION CARE 8. Most patients will experience some swelling and bruising in the area of the incisions.  Ice packs will help.  Swelling and bruising can take several days to resolve.  9. Unless discharge instructions indicate otherwise, follow guidelines below  a. STERI-STRIPS - you may remove your outer bandages 48 hours after surgery, and you may shower at that time.  You have steri-strips (small skin tapes) in place directly over the incision.  These strips should be left on the skin for 7-10 days.   b. DERMABOND/SKIN GLUE - you may shower in 24 hours.  The glue will flake off over the next 2-3 weeks. 10. Any sutures or staples will be removed at the office during your follow-up visit.  ACTIVITIES 11. You may resume regular (light) daily activities beginning the next day--such as daily self-care, walking, climbing stairs--gradually increasing activities as tolerated.  You may have sexual intercourse when it is comfortable.  Refrain from any heavy lifting or straining until approved by your doctor. a. You may drive when you are no longer taking prescription pain medication, you can comfortably wear a seatbelt, and you can safely maneuver your car and apply brakes.  FOLLOW-UP 12. You should see your doctor in the office for a follow-up appointment approximately 2-3 weeks after your surgery.  You should have been given your post-op/follow-up appointment when your surgery was scheduled.  If you did not receive a post-op/follow-up appointment, make sure that you call for this appointment within a day or two after you arrive home to insure a convenient appointment time.  WHEN   TO CALL YOUR DOCTOR: 1. Fever over 101.0 2. Inability to urinate 3. Continued bleeding from incision. 4. Increased pain, redness, or drainage from the incision. 5. Increasing abdominal pain  The clinic staff is available to answer your questions during regular business hours.  Please don't  hesitate to call and ask to speak to one of the nurses for clinical concerns.  If you have a medical emergency, go to the nearest emergency room or call 911.  A surgeon from [facility] is always on call at the hospital. [address], [address], [place], [place]  [address] ? [address] [address], [place], [place]   [address] [phone] ? [phone] ? FAX [phone] Web site: [email or web address]  

## 2018-06-16 ENCOUNTER — Encounter (HOSPITAL_COMMUNITY): Payer: Self-pay | Admitting: General Surgery

## 2018-06-16 LAB — COMPREHENSIVE METABOLIC PANEL
ALT: 51 U/L — ABNORMAL HIGH (ref 0–44)
ANION GAP: 16 — AB (ref 5–15)
AST: 55 U/L — ABNORMAL HIGH (ref 15–41)
Albumin: 2.4 g/dL — ABNORMAL LOW (ref 3.5–5.0)
Alkaline Phosphatase: 88 U/L (ref 38–126)
BUN: 47 mg/dL — ABNORMAL HIGH (ref 8–23)
CO2: 23 mmol/L (ref 22–32)
Calcium: 8.4 mg/dL — ABNORMAL LOW (ref 8.9–10.3)
Chloride: 95 mmol/L — ABNORMAL LOW (ref 98–111)
Creatinine, Ser: 1.78 mg/dL — ABNORMAL HIGH (ref 0.61–1.24)
GFR calc non Af Amer: 34 mL/min — ABNORMAL LOW (ref >60)
GFR, EST AFRICAN AMERICAN: 40 mL/min — AB (ref >60)
Glucose, Bld: 161 mg/dL — ABNORMAL HIGH (ref 70–99)
Potassium: 3.4 mmol/L — ABNORMAL LOW (ref 3.5–5.1)
Sodium: 134 mmol/L — ABNORMAL LOW (ref 135–145)
TOTAL PROTEIN: 6.9 g/dL (ref 6.5–8.1)
Total Bilirubin: 0.7 mg/dL (ref 0.3–1.2)

## 2018-06-16 LAB — CBC
HCT: 37.6 % — ABNORMAL LOW (ref 39.0–52.0)
Hemoglobin: 12.5 g/dL — ABNORMAL LOW (ref 13.0–17.0)
MCH: 28.6 pg (ref 26.0–34.0)
MCHC: 33.2 g/dL (ref 30.0–36.0)
MCV: 86 fL (ref 80.0–100.0)
Platelets: 208 10*3/uL (ref 150–400)
RBC: 4.37 MIL/uL (ref 4.22–5.81)
RDW: 13.3 % (ref 11.5–15.5)
WBC: 19.5 10*3/uL — ABNORMAL HIGH (ref 4.0–10.5)
nRBC: 0 % (ref 0.0–0.2)

## 2018-06-16 NOTE — Progress Notes (Signed)
1 Day Post-Op   Subjective/Chief Complaint: Sore on right side of abdomen around drain and laparoscopic sites Tolerating liquids - no nausea   Objective: Vital signs in last 24 hours: Temp:  [98 F (36.7 C)-98.8 F (37.1 C)] 98.6 F (37 C) (12/07 0755) Pulse Rate:  [80-93] 80 (12/07 0755) Resp:  [15-19] 15 (12/07 0755) BP: (124-142)/(72-88) 141/85 (12/07 0755) SpO2:  [92 %-96 %] 94 % (12/07 0755) Last BM Date: 06/14/18  Intake/Output from previous day: 12/06 0701 - 12/07 0700 In: 1813.4 [P.O.:240; I.V.:1150; IV Piggyback:423.4] Out: 4431 [VQMGQ:6761; Drains:100; Blood:100] Intake/Output this shift: No intake/output data recorded.  General appearance: alert, cooperative and no distress Resp: clear to auscultation bilaterally Cardio: regular rate and rhythm, S1, S2 normal, no murmur, click, rub or gallop GI: soft, tender in RUQ around laparoscopic sites Drain with some old blood; no bilious drainage; incisions c/d/i  Lab Results:  Recent Labs    06/15/18 0426 06/15/18 1506  WBC 20.8* 20.4*  HGB 13.8 13.3  HCT 40.8 40.7  PLT 186 223   BMET Recent Labs    06/14/18 1933 06/15/18 0426  NA 131* 133*  K 3.3* 3.0*  CL 90* 94*  CO2 25 25  GLUCOSE 133* 124*  BUN 44* 46*  CREATININE 1.82* 1.81*  CALCIUM 9.0 8.6*   PT/INR No results for input(s): LABPROT, INR in the last 72 hours. ABG No results for input(s): PHART, HCO3 in the last 72 hours.  Invalid input(s): PCO2, PO2  Studies/Results: Dg Chest 2 View  Result Date: 06/14/2018 CLINICAL DATA:  Abdominal pain, nausea, vomiting and diarrhea for the past 3 days. Ex-smoker. EXAM: CHEST - 2 VIEW COMPARISON:  06/09/2016. FINDINGS: Normal sized heart. The previously demonstrated left diaphragmatic eventration is smaller. Interval linear densities at the right lung base and increased linear densities at the left lung base. Mild peribronchial thickening. Mild bilateral shoulder degenerative changes. IMPRESSION: Interval  bibasilar linear atelectasis or scarring and mild bronchitic changes. Electronically Signed   By: Claudie Revering M.D.   On: 06/14/2018 19:22   Ct Abdomen Pelvis W Contrast  Result Date: 06/14/2018 CLINICAL DATA:  Abdominal pain EXAM: CT ABDOMEN AND PELVIS WITH CONTRAST TECHNIQUE: Multidetector CT imaging of the abdomen and pelvis was performed using the standard protocol following bolus administration of intravenous contrast. CONTRAST:  125mL OMNIPAQUE IOHEXOL 300 MG/ML  SOLN COMPARISON:  Ultrasound 01/28/2015.  CT 10/07/2009. FINDINGS: Lower chest: Linear scarring or atelectasis in the lung bases. Cardiomegaly. Hepatobiliary: Scattered cysts within the liver, stable. Gallbladder is moderately distended. Small stones or polyps noted along the gallbladder wall. Slight inflammation noted around the gallbladder. : No focal abnormality or ductal dilatation. Spleen: No focal abnormality.  Normal size. Adrenals/Urinary Tract: Multiple bilateral renal cysts. There is a new 1.8 cm low-density lesion in the mid to lower pole of the left kidney on image 32 which appears to demonstrate some internal enhancement concerning for a solid renal lesion. No hydronephrosis. Adrenal glands and urinary bladder unremarkable. Stomach/Bowel: Sigmoid diverticulosis. No active diverticulitis. Appendix is normal. No evidence of bowel obstruction. Vascular/Lymphatic: Aortic atherosclerosis. No enlarged abdominal or pelvic lymph nodes. Reproductive: No visible focal abnormality. Other: No free fluid or free air. Musculoskeletal: No acute bony abnormality. Diffuse degenerative disc and facet disease in the lumbar spine. IMPRESSION: Mildly distended gallbladder. There are noncalcified structures along the gallbladder wall which could reflect gallstones or polyps. There is mild stranding/inflammation around the pancreas. Cannot exclude cholecystitis. New small low-density lesion in the mid to lower pole  of the left kidney measures 1.8 cm.  Suggestion of possible internal enhancement. Cannot exclude solid enhancing lesion. This could be further evaluated with elective MRI if felt clinically indicated. Multiple bilateral renal cysts. Aortic atherosclerosis. Sigmoid diverticulosis. Bibasilar scarring or atelectasis.  Cardiomegaly. Electronically Signed   By: Rolm Baptise M.D.   On: 06/14/2018 21:48   US Abdomen Limited  Result Date: 06/14/2018 CLINICAL DATA:  Initial evaluation for acute abdominal pain, nausea, vomiting, diarrhea. EXAM: ULTRASOUND ABDOMEN LIMITED RIGHT UPPER QUADRANT COMPARISON:  Prior CT from earlier the same day. FINDINGS: Gallbladder: Gallbladder distended with heterogeneous echogenic material within the gallbladder lumen, likely sludge and/or sludge balls. 1.8 cm hyperechoic structure with posterior acoustic shadowing likely reflects a superimposed stone. Gallbladder wall thickened up to 7.2 mm with mild mural edema. No free pericholecystic fluid. No sonographic Murphy sign elicited on exam. Common bile duct: Diameter: 5.7 mm Liver: 1.5 cm cyst noted within the left hepatic lobe. Additional previously seen liver cysts not well visualized by sonography. Mildly increased echotexture of the hepatic parenchyma suggesting mild steatosis. Portal vein not assessed on this examination. IMPRESSION: 1. Heterogeneous echogenic and hyperechoic material within the gallbladder lumen, most likely reflecting stones and/or sludge. Possible gallbladder mass not entirely excluded, although felt to be less likely. Finding could be further assessed with dedicated MRI/MRCP for further evaluation as warranted. 2. Associated gallbladder wall thickening up to 7 mm. Clinical correlation for possible acute cholecystitis recommended. 3. No biliary dilatation. Electronically Signed   By: Jeannine Boga M.D.   On: 06/14/2018 23:58    Anti-infectives: Anti-infectives (From admission, onward)   Start     Dose/Rate Route Frequency Ordered Stop    06/15/18 0600  piperacillin-tazobactam (ZOSYN) IVPB 3.375 g     3.375 g 12.5 mL/hr over 240 Minutes Intravenous Every 8 hours 06/15/18 0053     06/14/18 2315  cefTRIAXone (ROCEPHIN) 2 g in sodium chloride 0.9 % 100 mL IVPB     2 g 200 mL/hr over 30 Minutes Intravenous  Once 06/14/18 2310 06/15/18 0032      Assessment/Plan:  Nausea/vomiting/abd pain Acute calculous cholecystitis AKI secondary to vomiting HTN Seizure disorder Hypokalemia   S/p lap cholecystectomy by Dr. Kieth Brightly 06/15/18 for gangrenous cholecystitis Advance diet as tolerated Recheck labs Ambulate   LOS: 1 day    Maia Petties 06/16/2018

## 2018-06-16 NOTE — Progress Notes (Signed)
Patient informed he will be transferred to 3E03.  Patient wife called and updated of patient transfer to 3E 03.

## 2018-06-16 NOTE — Progress Notes (Signed)
Patient arrived to unit, no co plaints. VSS. JP drain with scant drainage in bulb and on gauze. Will continue to monitor.

## 2018-06-16 NOTE — Plan of Care (Signed)
  Problem: Education: Goal: Knowledge of General Education information will improve Description Including pain rating scale, medication(s)/side effects and non-pharmacologic comfort measures Outcome: Progressing   Problem: Health Behavior/Discharge Planning: Goal: Ability to manage health-related needs will improve Outcome: Progressing   

## 2018-06-17 MED ORDER — SODIUM CHLORIDE 0.9 % IV SOLN
INTRAVENOUS | Status: DC | PRN
  Administered 2018-06-17 (×2): 250 mL via INTRAVENOUS

## 2018-06-17 MED ORDER — BISACODYL 5 MG PO TBEC
5.0000 mg | DELAYED_RELEASE_TABLET | Freq: Once | ORAL | Status: AC
  Administered 2018-06-17: 5 mg via ORAL
  Filled 2018-06-17: qty 1

## 2018-06-17 NOTE — Progress Notes (Signed)
2 Days Post-Op   Subjective/Chief Complaint: Patient transferred to [facility]. Mild bloating - not much appetite Pain well-controlled  Objective: Vital signs in last 24 hours: Temp:  [98.4 F (36.9 C)-99 F (37.2 C)] 98.4 F (36.9 C) (12/08 0640) Pulse Rate:  [80-86] 80 (12/08 0640) Resp:  [14-18] 18 (12/08 0640) BP: (130-145)/(80-94) 140/94 (12/08 0640) SpO2:  [92 %-96 %] 96 % (12/08 0640) Weight:  [75.5 kg] 75.5 kg (12/08 0014) Last BM Date: 06/14/18  Intake/Output from previous day: 12/07 0701 - 12/08 0700 In: 2318.6 [P.O.:120; I.V.:1659.5; IV Piggyback:539.1] Out: 1315 [Urine:1200; Drains:115] Intake/Output this shift: No intake/output data recorded.  General appearance: alert, cooperative and no distress Eyes: no scleral icterus GI: soft, mildly tender around incisions; Drain serosanguinous; incisons c/d/i  Lab Results:  Recent Labs    06/15/18 1506 06/16/18 0846  WBC 20.4* 19.5*  HGB 13.3 12.5*  HCT 40.7 37.6*  PLT 223 208   BMET Recent Labs    06/15/18 0426 06/16/18 0846  NA 133* 134*  K 3.0* 3.4*  CL 94* 95*  CO2 25 23  GLUCOSE 124* 161*  BUN 46* 47*  CREATININE 1.81* 1.78*  CALCIUM 8.6* 8.4*   PT/INR No results for input(s): LABPROT, INR in the last 72 hours. ABG No results for input(s): PHART, HCO3 in the last 72 hours.  Invalid input(s): PCO2, PO2  Studies/Results: No results found.  Anti-infectives: Anti-infectives (From admission, onward)   Start     Dose/Rate Route Frequency Ordered Stop   06/15/18 0600  piperacillin-tazobactam (ZOSYN) IVPB 3.375 g     3.375 g 12.5 mL/hr over 240 Minutes Intravenous Every 8 hours 06/15/18 0053     06/14/18 2315  cefTRIAXone (ROCEPHIN) 2 g in sodium chloride 0.9 % 100 mL IVPB     2 g 200 mL/hr over 30 Minutes Intravenous  Once 06/14/18 2310 06/15/18 0032      Assessment/Plan: Nausea/vomiting/abd pain Acute calculous cholecystitis AKI secondary to vomiting HTN Seizure  disorder Hypokalemia   S/p lap cholecystectomy by Dr. Kieth Brightly 06/15/18 for gangrenous cholecystitis Dulcolax today Will d/c drain tomorrow prior to discharge Saline lock IV Ambulate  LOS: 2 days    Maia Petties 06/17/2018

## 2018-06-18 LAB — CBC
HCT: 38 % — ABNORMAL LOW (ref 39.0–52.0)
Hemoglobin: 12.1 g/dL — ABNORMAL LOW (ref 13.0–17.0)
MCH: 28.1 pg (ref 26.0–34.0)
MCHC: 31.8 g/dL (ref 30.0–36.0)
MCV: 88.2 fL (ref 80.0–100.0)
Platelets: 306 10*3/uL (ref 150–400)
RBC: 4.31 MIL/uL (ref 4.22–5.81)
RDW: 13.2 % (ref 11.5–15.5)
WBC: 16.5 10*3/uL — ABNORMAL HIGH (ref 4.0–10.5)
nRBC: 0.1 % (ref 0.0–0.2)

## 2018-06-18 LAB — COMPREHENSIVE METABOLIC PANEL
ALK PHOS: 89 U/L (ref 38–126)
ALT: 43 U/L (ref 0–44)
AST: 28 U/L (ref 15–41)
Albumin: 2.3 g/dL — ABNORMAL LOW (ref 3.5–5.0)
Anion gap: 15 (ref 5–15)
BILIRUBIN TOTAL: 0.8 mg/dL (ref 0.3–1.2)
BUN: 32 mg/dL — ABNORMAL HIGH (ref 8–23)
CALCIUM: 8.7 mg/dL — AB (ref 8.9–10.3)
CO2: 26 mmol/L (ref 22–32)
Chloride: 94 mmol/L — ABNORMAL LOW (ref 98–111)
Creatinine, Ser: 1.61 mg/dL — ABNORMAL HIGH (ref 0.61–1.24)
GFR calc Af Amer: 45 mL/min — ABNORMAL LOW (ref >60)
GFR calc non Af Amer: 39 mL/min — ABNORMAL LOW (ref >60)
Glucose, Bld: 143 mg/dL — ABNORMAL HIGH (ref 70–99)
Potassium: 3.5 mmol/L (ref 3.5–5.1)
Sodium: 135 mmol/L (ref 135–145)
Total Protein: 6.6 g/dL (ref 6.5–8.1)

## 2018-06-18 MED ORDER — ACETAMINOPHEN 325 MG PO TABS: 650.0000 mg | ORAL_TABLET | Freq: Four times a day (QID) | ORAL | Status: DC | PRN

## 2018-06-18 MED ORDER — AMOXICILLIN-POT CLAVULANATE 875-125 MG PO TABS: 1.0000 | ORAL_TABLET | Freq: Two times a day (BID) | ORAL | 0 refills | Status: AC

## 2018-06-18 MED ORDER — ENOXAPARIN SODIUM 40 MG/0.4ML ~~LOC~~ SOLN
40.0000 mg | Freq: Every day | SUBCUTANEOUS | Status: DC
  Administered 2018-06-18: 40 mg via SUBCUTANEOUS
  Filled 2018-06-18: qty 0.4

## 2018-06-18 MED ORDER — HYDROCODONE-ACETAMINOPHEN 5-325 MG PO TABS: 1.0000 | ORAL_TABLET | ORAL | 0 refills | Status: DC | PRN

## 2018-06-18 NOTE — Care Management Important Message (Signed)
Important Message  Patient Details  Name: Isaiah Davidson MRN: [id number] Date of Birth: 1931-09-08   Medicare Important Message Given:  Yes    Lorea Kupfer P Gareld Obrecht 06/18/2018, 2:19 PM

## 2018-06-18 NOTE — Discharge Summary (Signed)
[facility] Discharge Summary   Patient ID: Isaiah Davidson MRN: [id number] DOB/AGE: 1931/10/18 84 y.o.  Admit date: 06/14/2018 Discharge date: 06/18/2018  Admitting Diagnosis: Acute cholecysitits  Discharge Diagnosis Acute calculous gangrenous cholecystitis  Consultants None  Imaging: No results found.  Procedures Dr. Kieth Brightly (06/15/18) - Laparoscopic Cholecystectomy  Hospital Course:  Patient is an 84 year old male who presented to [facility] from urgent care with abdominal pain, nausea and vomiting.  Workup showed acute cholecystitis.  Patient was admitted and underwent procedure listed above. Tolerated procedure well and was transferred to the floor.  Diet was advanced as tolerated.  On POD#3, the patient was voiding well, tolerating diet, ambulating well, pain well controlled, vital signs stable, incisions c/d/i and felt stable for discharge home.  Patient will follow up in our office in 2 weeks and knows to call with questions or concerns.  He will call to confirm appointment date/time.    PE: Gen:  Alert, NAD, pleasant, cooperative Card:  RRR, no M/G/R heard Pulm:  CTA, no W/R/R, rate and effort normal Abd: Soft, ND, +BS, incisions well appearing, mild TTP of RUQ without guarding, no peritonitis. JP drain with serosanguinous drainage and drain removed prior to discharge Skin: no rashes noted, warm and dry   Allergies as of 06/18/2018   No Known Allergies     Medication List    TAKE these medications   acetaminophen 325 MG tablet Commonly known as:  TYLENOL Take 2 tablets (650 mg total) by mouth every 6 (six) hours as needed for mild pain (or temp > 100).   amLODipine 10 MG tablet Commonly known as:  NORVASC TAKE 1 TABLET EVERY DAY   amoxicillin-clavulanate 875-125 MG tablet Commonly known as:  AUGMENTIN Take 1 tablet by mouth every 12 (twelve) hours for 5 days.   chlorthalidone 25 MG tablet Commonly known as:  HYGROTON TAKE 1 TABLET EVERY DAY    donepezil 5 MG tablet Commonly known as:  ARICEPT Take 1 tablet (5 mg total) by mouth at bedtime.   HYDROcodone-acetaminophen 5-325 MG tablet Commonly known as:  NORCO/VICODIN Take 1 tablet by mouth every 4 (four) hours as needed for moderate pain or severe pain.   levETIRAcetam 500 MG tablet Commonly known as:  KEPPRA Take 500 mg by mouth 2 (two) times daily.   multivitamin capsule Take 1 capsule by mouth daily.   simvastatin 40 MG tablet Commonly known as:  ZOCOR TAKE 1 TABLET EVERY DAY        Follow-up Information    Sur[facility], [place]. Go on 06/28/2018.   Specialty:  General Surgery Why:  Follow up appointment scheduled for 1:45 PM. Please arrive 30 min prior to appointment time. Bring photo ID and insurance information to appointment.  Contact information: [address] [place] [address] [phone]           Signed: Brigid Re, [facility] 06/18/2018, 3:42 PM Pager: [phone] Consults: [phone] Mon-Fri 7:00 am-4:30 pm Sat-Sun 7:00 am-11:30 am

## 2018-06-18 NOTE — Progress Notes (Signed)
[facility] Progress Note  3 Days Post-Op   Assessment/Plan  AKI secondary to vomiting HTN Seizure disorder Hypokalemia  Acute calculous cholecystitis S/p lap cholecystectomy by Dr. Kieth Brightly 06/15/18 for gangrenous  - Labs pending, possible discharge today - PT pending  FEN: heart healthy VTE: SCD's, lovenox ID: Rocephin 12/05-12/06,  Foley: none Follow up: [facility] 2 weeks    LOS: 3 days    Subjective: CC: abdominal soreness  No issues overnight. Pt denies nausea, vomiting, fever or chills. He is having BM's and tolerating diet. He lives with his wife and son. He is ambulatory at baseline.   Objective: Vital signs in last 24 hours: Temp:  [98.7 F (37.1 C)-99.8 F (37.7 C)] 99.8 F (37.7 C) (12/09 0417) Pulse Rate:  [81-93] 89 (12/09 0417) Resp:  [20] 20 (12/09 0417) BP: (124-144)/(77-89) 124/81 (12/09 0943) SpO2:  [92 %-93 %] 93 % (12/09 0417) Weight:  [73.9 kg] 73.9 kg (12/09 0417) Last BM Date: 06/18/18  Intake/Output from previous day: 12/08 0701 - 12/09 0700 In: 960 [P.O.:960] Out: 1235 [Urine:1200; Drains:35] Intake/Output this shift: Total I/O In: 120 [P.O.:120] Out: -   PE: Gen:  Alert, NAD, pleasant, cooperative Card:  RRR, no M/G/R heard Pulm:  CTA, no W/R/R, rate and effort normal Abd: Soft, ND, +BS, incisions well appearing, mild TTP of RUQ without guarding, no peritonitis  Skin: no rashes noted, warm and dry   Anti-infectives: Anti-infectives (From admission, onward)   Start     Dose/Rate Route Frequency Ordered Stop   06/15/18 0600  piperacillin-tazobactam (ZOSYN) IVPB 3.375 g     3.375 g 12.5 mL/hr over 240 Minutes Intravenous Every 8 hours 06/15/18 0053     06/14/18 2315  cefTRIAXone (ROCEPHIN) 2 g in sodium chloride 0.9 % 100 mL IVPB     2 g 200 mL/hr over 30 Minutes Intravenous  Once 06/14/18 2310 06/15/18 0032      Lab Results:  Recent Labs    06/15/18 1506 06/16/18 0846  WBC 20.4* 19.5*  HGB 13.3  12.5*  HCT 40.7 37.6*  PLT 223 208   BMET Recent Labs    06/16/18 0846  NA 134*  K 3.4*  CL 95*  CO2 23  GLUCOSE 161*  BUN 47*  CREATININE 1.78*  CALCIUM 8.4*   PT/INR No results for input(s): LABPROT, INR in the last 72 hours. CMP     Component Value Date/Time   NA 134 (L) 06/16/2018 0846   K 3.4 (L) 06/16/2018 0846   CL 95 (L) 06/16/2018 0846   CO2 23 06/16/2018 0846   GLUCOSE 161 (H) 06/16/2018 0846   BUN 47 (H) 06/16/2018 0846   CREATININE 1.78 (H) 06/16/2018 0846   CALCIUM 8.4 (L) 06/16/2018 0846   PROT 6.9 06/16/2018 0846   ALBUMIN 2.4 (L) 06/16/2018 0846   AST 55 (H) 06/16/2018 0846   ALT 51 (H) 06/16/2018 0846   ALKPHOS 88 06/16/2018 0846   BILITOT 0.7 06/16/2018 0846   GFRNONAA 34 (L) 06/16/2018 0846   GFRAA 40 (L) 06/16/2018 0846   Lipase     Component Value Date/Time   LIPASE 40 06/14/2018 1933    Studies/Results: No results found.    Kalman Drape , [facility] 06/18/2018, 10:15 AM  Pager: [phone] Mon-Wed, Friday 7:00am-4:30pm Thurs 7am-11:30am  Consults: [phone]

## 2018-06-18 NOTE — Evaluation (Signed)
Physical Therapy Evaluation Patient Details Name: Isaiah Davidson MRN: [id number] DOB: 1932/06/17 Today's Date: 06/18/2018   History of Present Illness  84yo male arrived to the ED with complaints of nausea, vomiting ,and abdominal pain. Diagnosed with acute calculous cholecystitis, received laparaoscopic cholecystectomy on 06/15/18. PMH HTN, mild cognitive impairment with memory loss, seizure, CTR, knee scope   Clinical Impression   Patient received in bed, very pleasant and motivated to participate in activity with therapist today. Able to complete bed mobility with Mod(I), required Min guard for functional transfers with no device, and able to gait train approximately 246f with no device and initially min guard due to mild balance deficit but faded to S as balance improved with extended gait. No SOB noted during gait/walkie-talkie test. No recliner present in room, attempted to find one but unable to find clean spare chair. He was left sitting at EOB with all needs met this morning. He will continue to benefit from skilled PT services in the acute setting, also recommend skilled OP PT services moving forward to further address functional strength and balance deficits.     Follow Up Recommendations Outpatient PT    Equipment Recommendations  None recommended by PT    Recommendations for Other Services       Precautions / Restrictions Precautions Precautions: Fall Restrictions Weight Bearing Restrictions: No      Mobility  Bed Mobility Overal bed mobility: Modified Independent             General bed mobility comments: extended time, no physical assist given   Transfers Overall transfer level: Needs assistance Equipment used: None Transfers: Sit to/from Stand Sit to Stand: Min guard         General transfer comment: min guard for safety, no physical assist given, mildly unsteady but able to self-correct without PT assist   Ambulation/Gait Ambulation/Gait assistance:  Min guard Gait Distance (Feet): 200 Feet Assistive device: None Gait Pattern/deviations: Step-through pattern;Trunk flexed Gait velocity: decreased    General Gait Details: gait pattern generally WNL, mildly unsteady at first but able to maintain balance without PT assist and balance improved during gait period, fading to [employer]   Modified Rankin (Stroke Patients Only)       Balance Overall balance assessment: Mild deficits observed, not formally tested                                           Pertinent Vitals/Pain Pain Assessment: 0-10 Pain Score: 2  Pain Location: abdomen  Pain Descriptors / Indicators: Sore Pain Intervention(s): Limited activity within patient's tolerance;Monitored during session    Home Living Family/patient expects to be discharged to:: Private residence Living Arrangements: Spouse/significant other;Children Available Help at Discharge: Family;Available 24 hours/day Type of Home: House Home Access: Stairs to enter Entrance Stairs-Rails: Can reach both Entrance Stairs-Number of Steps: 3 Home Layout: One level Home Equipment: None      Prior Function Level of Independence: Independent         Comments: doing very well before surgery, able to perform all ADLs, community ambulation, and daily task without difficulty      Hand Dominance        Extremity/Trunk Assessment   Upper Extremity Assessment Upper Extremity Assessment: Overall WFL for tasks assessed    Lower Extremity  Assessment Lower Extremity Assessment: Overall WFL for tasks assessed    Cervical / Trunk Assessment Cervical / Trunk Assessment: Normal  Communication   Communication: No difficulties  Cognition Arousal/Alertness: Awake/alert Behavior During Therapy: WFL for tasks assessed/performed Overall Cognitive Status: Within Functional Limits for tasks assessed                                         General Comments      Exercises     Assessment/Plan    PT Assessment Patient needs continued PT services  PT Problem List Decreased strength;Decreased balance       PT Treatment Interventions Therapeutic activities;Gait training;Therapeutic exercise;Patient/family education;DME instruction;Stair training;Balance training;Functional mobility training;Neuromuscular re-education    PT Goals (Current goals can be found in the Care Plan section)  Acute Rehab PT Goals Patient Stated Goal: go home, get back to routine  PT Goal Formulation: With patient Time For Goal Achievement: 07/02/18 Potential to Achieve Goals: Good    Frequency Min 3X/week   Barriers to discharge        Co-evaluation               AM-PAC PT "6 Clicks" Mobility  Outcome Measure Help needed turning from your back to your side while in a flat bed without using bedrails?: None Help needed moving from lying on your back to sitting on the side of a flat bed without using bedrails?: None Help needed moving to and from a bed to a chair (including a wheelchair)?: A Little Help needed standing up from a chair using your arms (e.g., wheelchair or bedside chair)?: A Little Help needed to walk in hospital room?: A Little Help needed climbing 3-5 steps with a railing? : A Little 6 Click Score: 20    End of Session   Activity Tolerance: Patient tolerated treatment well Patient left: in bed;with call bell/phone within reach(sitting at EOB as no recliner in room/unable to find a spare one )   PT Visit Diagnosis: Unsteadiness on feet (R26.81);Muscle weakness (generalized) (M62.81)    Time: 4034-7425 PT Time Calculation (min) (ACUTE ONLY): 15 min   Charges:   PT Evaluation $PT Eval Low Complexity: 1 Low          Deniece Ree PT, DPT, CBIS  Supplemental Physical Therapist [place]    Pager [phone] Acute Rehab Office [phone]

## 2018-06-18 NOTE — Progress Notes (Signed)
Discharge instructions given to patient and his wife at bedside. All questions answered

## 2018-06-19 ENCOUNTER — Other Ambulatory Visit: Payer: Self-pay | Admitting: Surgery

## 2018-07-09 ENCOUNTER — Observation Stay (HOSPITAL_COMMUNITY)
Admission: EM | Admit: 2018-07-09 | Discharge: 2018-07-11 | Disposition: A | Discharge status: Home / Self Care | Payer: Medicare HMO | Attending: Internal Medicine | Admitting: Internal Medicine

## 2018-07-09 ENCOUNTER — Emergency Department (HOSPITAL_COMMUNITY): Payer: Medicare HMO

## 2018-07-09 ENCOUNTER — Encounter (HOSPITAL_COMMUNITY): Payer: Self-pay | Admitting: General Practice

## 2018-07-09 ENCOUNTER — Other Ambulatory Visit: Payer: Self-pay

## 2018-07-09 DIAGNOSIS — I10 Essential (primary) hypertension: Secondary | ICD-10-CM | POA: Diagnosis not present

## 2018-07-09 DIAGNOSIS — G40909 Epilepsy, unspecified, not intractable, without status epilepticus: Secondary | ICD-10-CM

## 2018-07-09 DIAGNOSIS — B964 Proteus (mirabilis) (morganii) as the cause of diseases classified elsewhere: Secondary | ICD-10-CM | POA: Insufficient documentation

## 2018-07-09 DIAGNOSIS — Z9049 Acquired absence of other specified parts of digestive tract: Secondary | ICD-10-CM | POA: Insufficient documentation

## 2018-07-09 DIAGNOSIS — E785 Hyperlipidemia, unspecified: Secondary | ICD-10-CM | POA: Diagnosis not present

## 2018-07-09 DIAGNOSIS — R0602 Shortness of breath: Secondary | ICD-10-CM

## 2018-07-09 DIAGNOSIS — D649 Anemia, unspecified: Secondary | ICD-10-CM | POA: Diagnosis not present

## 2018-07-09 DIAGNOSIS — Z87891 Personal history of nicotine dependence: Secondary | ICD-10-CM | POA: Insufficient documentation

## 2018-07-09 DIAGNOSIS — G3184 Mild cognitive impairment, so stated: Secondary | ICD-10-CM | POA: Diagnosis present

## 2018-07-09 DIAGNOSIS — N390 Urinary tract infection, site not specified: Principal | ICD-10-CM | POA: Insufficient documentation

## 2018-07-09 DIAGNOSIS — I700 Atherosclerosis of aorta: Secondary | ICD-10-CM | POA: Diagnosis not present

## 2018-07-09 DIAGNOSIS — R197 Diarrhea, unspecified: Secondary | ICD-10-CM | POA: Diagnosis not present

## 2018-07-09 DIAGNOSIS — Z79899 Other long term (current) drug therapy: Secondary | ICD-10-CM | POA: Diagnosis not present

## 2018-07-09 DIAGNOSIS — R1011 Right upper quadrant pain: Secondary | ICD-10-CM | POA: Diagnosis not present

## 2018-07-09 DIAGNOSIS — R112 Nausea with vomiting, unspecified: Secondary | ICD-10-CM | POA: Diagnosis not present

## 2018-07-09 HISTORY — DX: Cardiac murmur, unspecified: R01.1

## 2018-07-09 LAB — COMPREHENSIVE METABOLIC PANEL
ALT: 15 U/L (ref 0–44)
AST: 22 U/L (ref 15–41)
Albumin: 3.2 g/dL — ABNORMAL LOW (ref 3.5–5.0)
Alkaline Phosphatase: 68 U/L (ref 38–126)
Anion gap: 14 (ref 5–15)
BUN: 17 mg/dL (ref 8–23)
CHLORIDE: 96 mmol/L — AB (ref 98–111)
CO2: 24 mmol/L (ref 22–32)
Calcium: 9.3 mg/dL (ref 8.9–10.3)
Creatinine, Ser: 1.5 mg/dL — ABNORMAL HIGH (ref 0.61–1.24)
GFR calc Af Amer: 49 mL/min — ABNORMAL LOW (ref >60)
GFR calc non Af Amer: 42 mL/min — ABNORMAL LOW (ref >60)
Glucose, Bld: 114 mg/dL — ABNORMAL HIGH (ref 70–99)
POTASSIUM: 3.6 mmol/L (ref 3.5–5.1)
Sodium: 134 mmol/L — ABNORMAL LOW (ref 135–145)
Total Bilirubin: 0.8 mg/dL (ref 0.3–1.2)
Total Protein: 7.5 g/dL (ref 6.5–8.1)

## 2018-07-09 LAB — CBC WITH DIFFERENTIAL/PLATELET
Abs Immature Granulocytes: 0.02 K/uL (ref 0.00–0.07)
Basophils Absolute: 0 K/uL (ref 0.0–0.1)
Basophils Relative: 0 %
Eosinophils Absolute: 0 K/uL (ref 0.0–0.5)
Eosinophils Relative: 0 %
HCT: 36.5 % — ABNORMAL LOW (ref 39.0–52.0)
Hemoglobin: 11.6 g/dL — ABNORMAL LOW (ref 13.0–17.0)
Immature Granulocytes: 0 %
Lymphocytes Relative: 30 %
Lymphs Abs: 1.7 K/uL (ref 0.7–4.0)
MCH: 27.5 pg (ref 26.0–34.0)
MCHC: 31.8 g/dL (ref 30.0–36.0)
MCV: 86.5 fL (ref 80.0–100.0)
Monocytes Absolute: 0.2 K/uL (ref 0.1–1.0)
Monocytes Relative: 4 %
Neutro Abs: 3.8 K/uL (ref 1.7–7.7)
Neutrophils Relative %: 66 %
Platelets: 387 K/uL (ref 150–400)
RBC: 4.22 MIL/uL (ref 4.22–5.81)
RDW: 12.4 % (ref 11.5–15.5)
WBC: 5.8 K/uL (ref 4.0–10.5)
nRBC: 0 % (ref 0.0–0.2)

## 2018-07-09 LAB — URINALYSIS, ROUTINE W REFLEX MICROSCOPIC
Bilirubin Urine: NEGATIVE
Glucose, UA: NEGATIVE mg/dL
Hgb urine dipstick: NEGATIVE
Ketones, ur: NEGATIVE mg/dL
Nitrite: NEGATIVE
Protein, ur: NEGATIVE mg/dL
Specific Gravity, Urine: 1.015 (ref 1.005–1.030)
pH: 8 (ref 5.0–8.0)

## 2018-07-09 LAB — PROTIME-INR
INR: 1.05
Prothrombin Time: 13.7 s (ref 11.4–15.2)

## 2018-07-09 LAB — AMMONIA: Ammonia: 21 umol/L (ref 9–35)

## 2018-07-09 LAB — I-STAT CG4 LACTIC ACID, ED: Lactic Acid, Venous: 1.53 mmol/L (ref 0.5–1.9)

## 2018-07-09 LAB — LIPASE, BLOOD: Lipase: 101 U/L — ABNORMAL HIGH (ref 11–51)

## 2018-07-09 MED ORDER — AMLODIPINE BESYLATE 10 MG PO TABS
10.0000 mg | ORAL_TABLET | Freq: Every day | ORAL | Status: DC
  Administered 2018-07-09 – 2018-07-11 (×3): 10 mg via ORAL
  Filled 2018-07-09 (×3): qty 1

## 2018-07-09 MED ORDER — TECHNETIUM TC 99M MEBROFENIN IV KIT
5.4700 | PACK | Freq: Once | INTRAVENOUS | Status: AC | PRN
  Administered 2018-07-09: 5.47 via INTRAVENOUS

## 2018-07-09 MED ORDER — SIMVASTATIN 20 MG PO TABS
40.0000 mg | ORAL_TABLET | Freq: Every day | ORAL | Status: DC
  Administered 2018-07-09 – 2018-07-11 (×3): 40 mg via ORAL
  Filled 2018-07-09 (×3): qty 2

## 2018-07-09 MED ORDER — IOHEXOL 300 MG/ML  SOLN
100.0000 mL | Freq: Once | INTRAMUSCULAR | Status: AC | PRN
  Administered 2018-07-09: 100 mL via INTRAVENOUS

## 2018-07-09 MED ORDER — ACETAMINOPHEN 325 MG PO TABS
650.0000 mg | ORAL_TABLET | Freq: Four times a day (QID) | ORAL | Status: DC | PRN
  Administered 2018-07-09: 650 mg via ORAL
  Filled 2018-07-09: qty 2

## 2018-07-09 MED ORDER — DIPHENHYDRAMINE HCL 50 MG/ML IJ SOLN
25.0000 mg | Freq: Once | INTRAMUSCULAR | Status: AC
  Administered 2018-07-09: 25 mg via INTRAVENOUS
  Filled 2018-07-09: qty 1

## 2018-07-09 MED ORDER — ONDANSETRON HCL 4 MG/2ML IJ SOLN: 4.0000 mg | Freq: Four times a day (QID) | INTRAMUSCULAR | Status: DC | PRN

## 2018-07-09 MED ORDER — LEVETIRACETAM 500 MG PO TABS
500.0000 mg | ORAL_TABLET | Freq: Two times a day (BID) | ORAL | Status: DC
  Administered 2018-07-09 – 2018-07-11 (×4): 500 mg via ORAL
  Filled 2018-07-09 (×4): qty 1

## 2018-07-09 MED ORDER — ENOXAPARIN SODIUM 40 MG/0.4ML ~~LOC~~ SOLN
40.0000 mg | SUBCUTANEOUS | Status: DC
  Administered 2018-07-09 – 2018-07-10 (×2): 40 mg via SUBCUTANEOUS
  Filled 2018-07-09 (×2): qty 0.4

## 2018-07-09 MED ORDER — ADULT MULTIVITAMIN W/MINERALS CH
1.0000 | ORAL_TABLET | Freq: Every day | ORAL | Status: DC
  Administered 2018-07-09 – 2018-07-11 (×3): 1 via ORAL
  Filled 2018-07-09 (×3): qty 1

## 2018-07-09 MED ORDER — DONEPEZIL HCL 5 MG PO TABS
5.0000 mg | ORAL_TABLET | Freq: Every day | ORAL | Status: DC
  Administered 2018-07-09 – 2018-07-10 (×2): 5 mg via ORAL
  Filled 2018-07-09 (×2): qty 1

## 2018-07-09 MED ORDER — HYDROCODONE-ACETAMINOPHEN 5-325 MG PO TABS: 1.0000 | ORAL_TABLET | ORAL | Status: DC | PRN

## 2018-07-09 MED ORDER — LACTATED RINGERS IV SOLN
INTRAVENOUS | Status: DC
  Administered 2018-07-09: 21:00:00 via INTRAVENOUS

## 2018-07-09 MED ORDER — ONDANSETRON HCL 4 MG PO TABS
4.0000 mg | ORAL_TABLET | Freq: Four times a day (QID) | ORAL | Status: DC | PRN
  Administered 2018-07-09: 4 mg via ORAL
  Filled 2018-07-09: qty 1

## 2018-07-09 MED ORDER — SODIUM CHLORIDE 0.9 % IV BOLUS
1000.0000 mL | Freq: Once | INTRAVENOUS | Status: AC
  Administered 2018-07-09: 1000 mL via INTRAVENOUS

## 2018-07-09 MED ORDER — FENTANYL CITRATE (PF) 100 MCG/2ML IJ SOLN
50.0000 ug | Freq: Once | INTRAMUSCULAR | Status: AC
  Administered 2018-07-09: 50 ug via INTRAVENOUS
  Filled 2018-07-09: qty 2

## 2018-07-09 MED ORDER — MULTIVITAMINS PO CAPS: 1.0000 | ORAL_CAPSULE | Freq: Every day | ORAL | Status: DC

## 2018-07-09 MED ORDER — ONDANSETRON HCL 4 MG/2ML IJ SOLN
4.0000 mg | Freq: Once | INTRAMUSCULAR | Status: AC
  Administered 2018-07-09: 4 mg via INTRAVENOUS
  Filled 2018-07-09: qty 2

## 2018-07-09 NOTE — Consult Note (Signed)
[facility] Surgery Consult Note  Isaiah Davidson 09-Aug-1931  [id number].    Requesting MD: Marda Stalker Chief Complaint/Reason for Consult: s/p lap chole  HPI:  Isaiah Davidson is an 84yo male s/p laparoscopic cholecystectomy for gangrenous cholecystitis 06/15/18 by Dr. Kieth Brightly, who presents to [facility] today with 2 days of persistent nausea, vomiting, diarrhea, and itching. States that he had been doing very well since surgery until Saturday. He developed diffuse itching. Denies jaundice or scleral icterus. Denies any increased abdominal pain, just the above listed symptoms. He is having 3-4 loose stools daily and multiple episodes of n/v. Cannot tolerate anything to eat/drink. Denies any recent sick contacts. No known fevers but he does report hot/cold spells and feeling feverish.  ED workup included CT scan abdomen/pelvis which revealed two rim enhancing collections in the gallbladder fossa measuring up to 2.5 cm. WBC 5.8, lactic acid 1.53, AST 22, ALT 15, alk phos 68, Tbili 0.8, creatinine 1.50, lipase 101.    Review of Systems  Constitutional: Positive for chills.  HENT: Negative.   Eyes: Negative.   Respiratory: Negative.   Cardiovascular: Negative.   Gastrointestinal: Positive for diarrhea, nausea and vomiting. Negative for abdominal pain.  Genitourinary: Negative.   Musculoskeletal: Negative.   Skin: Positive for itching.  Neurological: Negative.    All systems reviewed and otherwise negative except for as above  Family History  Problem Relation Age of Onset  . Diabetes Paternal Grandmother        raised by grandparents    Past Medical History:  Diagnosis Date  . HYPERLIPIDEMIA 02/22/2007  . HYPERTENSION 02/22/2007  . MCI (mild cognitive impairment) with memory loss 05/14/2014  . Seizures ([place])    treated for siezures around 2010    Past Surgical History:  Procedure Laterality Date  . CARPAL TUNNEL RELEASE    . CHOLECYSTECTOMY N/A 06/15/2018   Procedure:  LAPAROSCOPIC CHOLECYSTECTOMY;  Surgeon: Kinsinger, Arta Bruce, MD;  Location: [place];  Service: General;  Laterality: N/A;  . KNEE SURGERY     arthroscopic    Social History:  reports that he quit smoking about 40 years ago. His smoking use included cigars and cigarettes. He has a 30.00 pack-year smoking history. He has never used smokeless tobacco. He reports that he does not drink alcohol or use drugs.  Allergies: No Known Allergies  (Not in a hospital admission)   Prior to Admission medications   Medication Sig Start Date End Date Taking? Authorizing Provider  acetaminophen (TYLENOL) 325 MG tablet Take 2 tablets (650 mg total) by mouth every 6 (six) hours as needed for mild pain (or temp > 100). 06/18/18   Rayburn, Floyce Stakes, PA-C  amLODipine (NORVASC) 10 MG tablet TAKE 1 TABLET EVERY DAY Patient taking differently: Take 10 mg by mouth daily.  03/21/18   Marin Olp, MD  chlorthalidone (HYGROTON) 25 MG tablet TAKE 1 TABLET EVERY DAY Patient taking differently: Take 25 mg by mouth daily.  03/21/18   Marin Olp, MD  donepezil (ARICEPT) 5 MG tablet Take 1 tablet (5 mg total) by mouth at bedtime. 11/14/17   Ward Givens, NP  HYDROcodone-acetaminophen (NORCO/VICODIN) 5-325 MG tablet Take 1 tablet by mouth every 4 (four) hours as needed for moderate pain or severe pain. 06/18/18   Rayburn, Floyce Stakes, PA-C  levETIRAcetam (KEPPRA) 500 MG tablet Take 500 mg by mouth 2 (two) times daily.    [provider]  Multiple Vitamin (MULTIVITAMIN) capsule Take 1 capsule by mouth daily.  [provider]  simvastatin (ZOCOR) 40 MG tablet TAKE 1 TABLET EVERY DAY Patient taking differently: Take 40 mg by mouth daily.  03/21/18   Marin Olp, MD    Blood pressure (!) 144/90, pulse 81, temperature 98.1 F (36.7 C), temperature source Oral, resp. rate 17, SpO2 98 %. Physical Exam: General: pleasant, WD/WN AA male who is laying in bed in NAD HEENT: head is normocephalic,  atraumatic.  Sclera are noninjected.  Pupils equal and round.  Ears and nose without any masses or lesions.  Mouth is dry. Dentition fair Heart: regular, rate, and rhythm.  No obvious murmurs, gallops, or rubs noted.  Lungs: CTAB, no wheezes, rhonchi, or rales noted.  Respiratory effort nonlabored Abd: multiple well healed lap incisions cdi without erythema or drainage, soft, NT, mild distension, +BS, no masses, hernias, or organomegaly MS: all 4 extremities are symmetrical with no cyanosis, clubbing, or edema. Skin: warm and dry with no masses, lesions, or rashes. No jaundice Psych: A&Ox3 with an appropriate affect. Neuro: cranial nerves grossly intact, extremity CSM intact bilaterally, normal speech  Results for orders placed or performed during the hospital encounter of 07/09/18 (from the past 48 hour(s))  CBC with Differential     Status: Abnormal   Collection Time: 07/09/18 11:23 AM  Result Value Ref Range   WBC 5.8 4.0 - 10.5 K/uL   RBC 4.22 4.22 - 5.81 MIL/uL   Hemoglobin 11.6 (L) 13.0 - 17.0 g/dL   HCT 36.5 (L) 39.0 - 52.0 %   MCV 86.5 80.0 - 100.0 fL   MCH 27.5 26.0 - 34.0 pg   MCHC 31.8 30.0 - 36.0 g/dL   RDW 12.4 11.5 - 15.5 %   Platelets 387 150 - 400 K/uL   nRBC 0.0 0.0 - 0.2 %   Neutrophils Relative % 66 %   Neutro Abs 3.8 1.7 - 7.7 K/uL   Lymphocytes Relative 30 %   Lymphs Abs 1.7 0.7 - 4.0 K/uL   Monocytes Relative 4 %   Monocytes Absolute 0.2 0.1 - 1.0 K/uL   Eosinophils Relative 0 %   Eosinophils Absolute 0.0 0.0 - 0.5 K/uL   Basophils Relative 0 %   Basophils Absolute 0.0 0.0 - 0.1 K/uL   Immature Granulocytes 0 %   Abs Immature Granulocytes 0.02 0.00 - 0.07 K/uL    Comment: Performed at [facility], [address] [place], [place] [address]  Comprehensive metabolic panel     Status: Abnormal   Collection Time: 07/09/18 11:23 AM  Result Value Ref Range   Sodium 134 (L) 135 - 145 mmol/L   Potassium 3.6 3.5 - 5.1 mmol/L   Chloride 96 (L) 98 - 111  mmol/L   CO2 24 22 - 32 mmol/L   Glucose, Bld 114 (H) 70 - 99 mg/dL   BUN 17 8 - 23 mg/dL   Creatinine, Ser 1.50 (H) 0.61 - 1.24 mg/dL   Calcium 9.3 8.9 - 10.3 mg/dL   Total Protein 7.5 6.5 - 8.1 g/dL   Albumin 3.2 (L) 3.5 - 5.0 g/dL   AST 22 15 - 41 U/L   ALT 15 0 - 44 U/L   Alkaline Phosphatase 68 38 - 126 U/L   Total Bilirubin 0.8 0.3 - 1.2 mg/dL   GFR calc non Af Amer 42 (L) >60 mL/min   GFR calc Af Amer 49 (L) >60 mL/min   Anion gap 14 5 - 15    Comment: Performed at [facility]  Lab, [address], [place], [place] [address]  Lipase, blood     Status: Abnormal   Collection Time: 07/09/18 11:23 AM  Result Value Ref Range   Lipase 101 (H) 11 - 51 U/L    Comment: Performed at [facility], [place] [address], [place], [place] [address]  Ammonia     Status: None   Collection Time: 07/09/18 11:23 AM  Result Value Ref Range   Ammonia 21 9 - 35 umol/L    Comment: Performed at [facility], [place] [address] [place], [place] [address]  Protime-INR     Status: None   Collection Time: 07/09/18 11:23 AM  Result Value Ref Range   Prothrombin Time 13.7 11.4 - 15.2 seconds   INR 1.05     Comment: Performed at [facility] [address] [place], [place] [address]  I-Stat CG4 Lactic Acid, ED     Status: None   Collection Time: 07/09/18 11:35 AM  Result Value Ref Range   Lactic Acid, Venous 1.53 0.5 - 1.9 mmol/L   Ct Abdomen Pelvis W Contrast  Result Date: 07/09/2018 CLINICAL DATA:  Right upper quadrant pain. Cholecystectomy 3 weeks ago EXAM: CT ABDOMEN AND PELVIS WITH CONTRAST TECHNIQUE: Multidetector CT imaging of the abdomen and pelvis was performed using the standard protocol following bolus administration of intravenous contrast. CONTRAST:  166m OMNIPAQUE IOHEXOL 300 MG/ML  SOLN COMPARISON:  06/14/2018 FINDINGS: Lower chest:  Extensive coronary atherosclerotic calcification Hepatobiliary: Multiple hepatic cysts.Recent cholecystectomy with 2 low-density collections  in the gallbladder fossa, both measuring up to 2.5 cm. The more proximal contains mottled central lucency which may be gas within hemostatic material. Linear high densities within this gas containing collection may reflect clip/suture. No calcified choledocholithiasis. Pancreas: Main duct dilatation to 7 mm that is chronic when compared to 2011 abdominal CT. No evidence of pancreatitis. Spleen: Small subcapsular hypervascular focus superiorly, stable and benign compared to 2011 comparison Adrenals/Urinary Tract: Negative adrenals. Bilateral renal cysts with occasional thin mural calcification. 19 mm intermediate density lesion in the interpolar left kidney posteriorly, also described on most recent prior. There are additional renal lesions within densitometry higher than simple fluid but showing a more homogeneous internal appearance. No hydronephrosis. Negative urinary bladder Stomach/Bowel: No obstruction. No bowel inflammation, including appendicitis. Vascular/Lymphatic: Atherosclerotic calcification of the aorta and iliacs. No mass or adenopathy. Reproductive:Negative Other: No ascites or pneumoperitoneum. Musculoskeletal: No acute abnormalities. Hip and spine degenerative changes. IMPRESSION: 1. Two rim enhancing collections in the gallbladder fossa measuring up to 2.5 cm. Sterility is indeterminate. 2. Indeterminate renal lesions that could be complex cysts or solid, measuring up to 19 mm on the left. If appropriate for comorbidities an elective renal MRI could be obtained. Electronically Signed   By: JMonte FantasiaM.D.   On: 07/09/2018 13:12      Assessment/Plan HTN HLD AKI - Cr 1.5, down since discharge a couple weeks ago (1.61)  Nausea, vomiting, diarrhea Itching  S/p laparoscopic cholecystectomy for gangrenous cholecystitis 06/15/18 by Dr. KKieth Brightly- Admit to medicine for rehydration. HIDA ordered to rule out bile leak. Will also check c diff/GI panel due to diarrhea. Repeat CBC/CMP in  AM.  ID - none FEN - IVF, NPO Foley - none   BWellington Hampshire PWest [facility] 07/09/2018, 2:01 PM Pager: [phone] 7:00 am -11:30 AM Tues-Fri 7:00 am-4:30 pm Sat-Sun 7:00 am-11:30 am

## 2018-07-09 NOTE — ED Notes (Signed)
Patient transported to CT 

## 2018-07-09 NOTE — ED Triage Notes (Signed)
Pt here with c/o n/v/d  Since Sunday , pt had gallbladder out on December 6th

## 2018-07-09 NOTE — ED Provider Notes (Signed)
[facility] EMERGENCY DEPARTMENT Provider Note   CSN: [id number] Arrival date & time: 07/09/18  1324     History   Chief Complaint Chief Complaint  Patient presents with  . Nausea  . Emesis    HPI Isaiah Davidson is a 84 y.o. male.  The history is provided by the patient, medical records, the spouse and a relative. No language interpreter was used.  Abdominal Pain   This is a new problem. The current episode started 2 days ago. The problem occurs constantly. The problem has been gradually improving. The pain is associated with a previous surgery. The pain is located in the epigastric region and RUQ. The quality of the pain is aching and sharp. The pain is at a severity of 10/10. The pain is severe. Associated symptoms include fever, diarrhea, nausea and vomiting. Pertinent negatives include melena, constipation, dysuria, frequency and headaches. The symptoms are aggravated by palpation and eating. Nothing relieves the symptoms. Past workup includes surgery. His past medical history is significant for gallstones (recent cholecystitis).    Past Medical History:  Diagnosis Date  . HYPERLIPIDEMIA 02/22/2007  . HYPERTENSION 02/22/2007  . MCI (mild cognitive impairment) with memory loss 05/14/2014  . Seizures ([place])    treated for siezures around 2010    Patient Active Problem List   Diagnosis Date Noted  . Cholecystitis 06/15/2018  . Lipoma 02/02/2017  . Aortic atherosclerosis ([place]) 08/05/2016  . 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 Finger infection 06/11/2014  . MCI (mild cognitive impairment) with memory loss 05/14/2014  . Shortness of breath 10/12/2012  . Seizure disorder ([facility]) 04/19/2011  . Carpal tunnel syndrome 08/18/2010  . Cyst and pseudocyst of pancreas 04/12/2010  . Hyperlipidemia 02/22/2007  . Essential hypertension 02/22/2007    Past Surgical History:  Procedure Laterality Date  . CARPAL TUNNEL RELEASE    . CHOLECYSTECTOMY N/A 06/15/2018   Procedure: LAPAROSCOPIC CHOLECYSTECTOMY;  Surgeon: Kinsinger, Arta Bruce, MD;  Location: [facility];  Service: General;  Laterality: N/A;  . KNEE SURGERY     arthroscopic        Home Medications    Prior to Admission medications   Medication Sig Start Date End Date Taking? Authorizing Provider  acetaminophen (TYLENOL) 325 MG tablet Take 2 tablets (650 mg total) by mouth every 6 (six) hours as needed for mild pain (or temp > 100). 06/18/18   Rayburn, Floyce Stakes, PA-C  amLODipine (NORVASC) 10 MG tablet TAKE 1 TABLET EVERY DAY Patient taking differently: Take 10 mg by mouth daily.  03/21/18   Marin Olp, MD  chlorthalidone (HYGROTON) 25 MG tablet TAKE 1 TABLET EVERY DAY Patient taking differently: Take 25 mg by mouth daily.  03/21/18   Marin Olp, MD  donepezil (ARICEPT) 5 MG tablet Take 1 tablet (5 mg total) by mouth at bedtime. 11/14/17   Ward Givens, NP  HYDROcodone-acetaminophen (NORCO/VICODIN) 5-325 MG tablet Take 1 tablet by mouth every 4 (four) hours as needed for moderate pain or severe pain. 06/18/18   Rayburn, Floyce Stakes, PA-C  levETIRAcetam (KEPPRA) 500 MG tablet Take 500 mg by mouth 2 (two) times daily.    [provider]  Multiple Vitamin (MULTIVITAMIN) capsule Take 1 capsule by mouth daily.      [provider]  simvastatin (ZOCOR) 40 MG tablet TAKE 1 TABLET EVERY DAY Patient taking differently: Take 40 mg by mouth daily.  03/21/18   Marin Olp, MD    Family History Family History  Problem Relation  Age of Onset  . Diabetes Paternal Grandmother        raised by grandparents    Social History Social History   Tobacco Use  . Smoking status: Former Smoker    Packs/day: 1.00    Years: 30.00    Pack years: 30.00    Types: Cigars, Cigarettes    Last attempt to quit: 07/11/1978    Years since quitting: 40.0  . Smokeless tobacco: Never Used  Substance Use Topics  . Alcohol use: No    Alcohol/week: 0.0 standard drinks  . Drug use: No      Allergies   Patient has no known allergies.   Review of Systems Review of Systems  Constitutional: Positive for chills and fever. Negative for diaphoresis and fatigue.  HENT: Negative for congestion.   Respiratory: Negative for cough, chest tightness, shortness of breath and wheezing.   Cardiovascular: Negative for chest pain and palpitations.  Gastrointestinal: Positive for abdominal pain, diarrhea, nausea and vomiting. Negative for constipation and melena.  Genitourinary: Negative for decreased urine volume, dysuria, flank pain and frequency.  Musculoskeletal: Negative for back pain, neck pain and neck stiffness.  Skin: Negative for rash and wound.  Neurological: Negative for light-headedness and headaches.  Psychiatric/Behavioral: Negative for agitation.  All other systems reviewed and are negative.    Physical Exam Updated Vital Signs BP 121/80 (BP Location: Left Arm)   Pulse 80   Temp 98.1 F (36.7 C) (Oral)   Resp 16   SpO2 97%   Physical Exam Vitals signs and nursing note reviewed.  Constitutional:      General: He is not in acute distress.    Appearance: He is well-developed. He is not ill-appearing, toxic-appearing or diaphoretic.  HENT:     Head: Normocephalic and atraumatic.     Mouth/Throat:     Mouth: Mucous membranes are moist.     Pharynx: No oropharyngeal exudate or posterior oropharyngeal erythema.  Eyes:     Extraocular Movements: Extraocular movements intact.     Conjunctiva/sclera: Conjunctivae normal.     Pupils: Pupils are equal, round, and reactive to light.  Neck:     Musculoskeletal: Neck supple.  Cardiovascular:     Rate and Rhythm: Normal rate and regular rhythm.     Pulses: Normal pulses.     Heart sounds: No murmur.  Pulmonary:     Effort: Pulmonary effort is normal. No respiratory distress.     Breath sounds: Normal breath sounds. No wheezing, rhonchi or rales.  Chest:     Chest wall: No tenderness.  Abdominal:     General:  Abdomen is flat. There is no distension.     Palpations: Abdomen is soft.     Tenderness: There is no abdominal tenderness. There is no right CVA tenderness, left CVA tenderness, guarding or rebound.     Comments: Abdominal surgical sites are well-appearing and nontender.  No erythema or drainage seen.  Musculoskeletal:        General: No tenderness.  Skin:    General: Skin is warm and dry.     Capillary Refill: Capillary refill takes less than 2 seconds.  Neurological:     General: No focal deficit present.     Mental Status: He is alert and oriented to person, place, and time.      ED Treatments / Results  Labs (all labs ordered are listed, but only abnormal results are displayed) Labs Reviewed  CBC WITH DIFFERENTIAL/PLATELET - Abnormal; Notable for the following components:  Result Value   Hemoglobin 11.6 (*)    HCT 36.5 (*)    All other components within normal limits  COMPREHENSIVE METABOLIC PANEL - Abnormal; Notable for the following components:   Sodium 134 (*)    Chloride 96 (*)    Glucose, Bld 114 (*)    Creatinine, Ser 1.50 (*)    Albumin 3.2 (*)    GFR calc non Af Amer 42 (*)    GFR calc Af Amer 49 (*)    All other components within normal limits  LIPASE, BLOOD - Abnormal; Notable for the following components:   Lipase 101 (*)    All other components within normal limits  URINALYSIS, ROUTINE W REFLEX MICROSCOPIC - Abnormal; Notable for the following components:   Leukocytes, UA MODERATE (*)    Bacteria, UA MANY (*)    All other components within normal limits  URINE CULTURE  C DIFFICILE QUICK SCREEN W PCR REFLEX  GASTROINTESTINAL PANEL BY PCR, STOOL (REPLACES STOOL CULTURE)  AMMONIA  PROTIME-INR  CBC  COMPREHENSIVE METABOLIC PANEL  I-STAT CG4 LACTIC ACID, ED    EKG EKG Interpretation  Date/Time:  Monday July 09 2018 11:26:32 EST Ventricular Rate:  78 PR Interval:    QRS Duration: 91 QT Interval:  395 QTC Calculation: 450 R  Axis:   13 Text Interpretation:  Sinus rhythm Ventricular premature complex Abnormal R-wave progression, early transition When compared to prior, no significant changes seen aside from bew PVC.  No STEMI Confirmed by Antony Blackbird [phone]) on 07/09/2018 12:04:51 PM Also confirmed by Antony Blackbird [phone]), editor Philomena Doheny [phone])  on 07/09/2018 1:05:38 PM   Radiology Ct Abdomen Pelvis W Contrast  Result Date: 07/09/2018 CLINICAL DATA:  Right upper quadrant pain. Cholecystectomy 3 weeks ago EXAM: CT ABDOMEN AND PELVIS WITH CONTRAST TECHNIQUE: Multidetector CT imaging of the abdomen and pelvis was performed using the standard protocol following bolus administration of intravenous contrast. CONTRAST:  159mL OMNIPAQUE IOHEXOL 300 MG/ML  SOLN COMPARISON:  06/14/2018 FINDINGS: Lower chest:  Extensive coronary atherosclerotic calcification Hepatobiliary: Multiple hepatic cysts.Recent cholecystectomy with 2 low-density collections in the gallbladder fossa, both measuring up to 2.5 cm. The more proximal contains mottled central lucency which may be gas within hemostatic material. Linear high densities within this gas containing collection may reflect clip/suture. No calcified choledocholithiasis. Pancreas: Main duct dilatation to 7 mm that is chronic when compared to 2011 abdominal CT. No evidence of pancreatitis. Spleen: Small subcapsular hypervascular focus superiorly, stable and benign compared to 2011 comparison Adrenals/Urinary Tract: Negative adrenals. Bilateral renal cysts with occasional thin mural calcification. 19 mm intermediate density lesion in the interpolar left kidney posteriorly, also described on most recent prior. There are additional renal lesions within densitometry higher than simple fluid but showing a more homogeneous internal appearance. No hydronephrosis. Negative urinary bladder Stomach/Bowel: No obstruction. No bowel inflammation, including appendicitis. Vascular/Lymphatic:  Atherosclerotic calcification of the aorta and iliacs. No mass or adenopathy. Reproductive:Negative Other: No ascites or pneumoperitoneum. Musculoskeletal: No acute abnormalities. Hip and spine degenerative changes. IMPRESSION: 1. Two rim enhancing collections in the gallbladder fossa measuring up to 2.5 cm. Sterility is indeterminate. 2. Indeterminate renal lesions that could be complex cysts or solid, measuring up to 19 mm on the left. If appropriate for comorbidities an elective renal MRI could be obtained. Electronically Signed   By: Monte Fantasia M.D.   On: 07/09/2018 13:12   Nm Hepato Biliary Leak  Result Date: 07/09/2018 CLINICAL DATA:  Status post laparoscopic cholecystectomy 06/15/2018. Patient complaining  of nausea vomiting and diarrhea. Evaluate for possible bile leak. EXAM: NUCLEAR MEDICINE HEPATOBILIARY IMAGING TECHNIQUE: Sequential images of the abdomen were obtained out to 60 minutes following intravenous administration of radiopharmaceutical. RADIOPHARMACEUTICALS:  5.47 mCi Tc-76m  Choletec IV COMPARISON:  CT scan 07/09/2018 FINDINGS: Prompt and symmetric uptake in the liver and prompt excretion into the biliary tree by 10 minutes. Activity is seen in the small bowel by 15 minutes. No abnormal collection of the radiopharmaceutical to suggest a bile leak. IMPRESSION: Negative hepatobiliary scan for bile leak. Electronically Signed   By: Marijo Sanes M.D.   On: 07/09/2018 16:25    Procedures Procedures (including critical care time)  Medications Ordered in ED Medications  sodium chloride 0.9 % bolus 1,000 mL (0 mLs Intravenous Stopped 07/09/18 1232)  ondansetron (ZOFRAN) injection 4 mg (4 mg Intravenous Given 07/09/18 1131)  fentaNYL (SUBLIMAZE) injection 50 mcg (50 mcg Intravenous Given 07/09/18 1131)  diphenhydrAMINE (BENADRYL) injection 25 mg (25 mg Intravenous Given 07/09/18 1221)  iohexol (OMNIPAQUE) 300 MG/ML solution 100 mL (100 mLs Intravenous Contrast Given 07/09/18 1238)   technetium TC 62M mebrofenin (CHOLETEC) injection 8.75 millicurie (6.43 millicuries Intravenous Contrast Given 07/09/18 1521)     Initial Impression / Assessment and Plan / ED Course  I have reviewed the triage vital signs and the nursing notes.  Pertinent labs & imaging results that were available during my care of the patient were reviewed by me and considered in my medical decision making (see chart for details).     Isaiah Davidson is a 84 y.o. male with hypertension, hyperlipidemia, mild cognitive impairment, seizure disorder, and recent cholecystectomy for cholecystitis on 06/15/18 (3 weeks ago) who presents with right upper quadrant abdominal pain, nausea, vomiting, diarrhea, malaise, itching, subjective fevers, and chills.  Patient reports that he symptoms similar to how he felt before he had acute cholecystitis.  He reports that he has had no complications after the operation until the last 2 days when he was having the symptoms.  He reports that he has had severe pruritus that he took Benadryl for and helped somewhat.  He reports that he developed pain in his right upper quadrant he says is been up to a 10 out of 10 in severity but is currently somewhat improved.  He reports nausea and vomiting as well as some loose stools.  No blood in his bowel movements or emesis.  He reports no shortness of breath, chest pain, or cough.  No respiratory troubles.  He denies other trauma.  Denies any change in abdominal appearance.  Surgical wounds have been well-appearing with no drainage.  He denies any back pain or flank pain.  On exam, patient has no significant tenderness in the abdomen.  Normal bowel sounds.  Lungs clear chest nontender.  No murmur.  CVA areas nontender.  Patient well-appearing.  Given the patient's recent gallbladder surgery and his recurrent pain, nausea, vomiting, diarrhea and chills, patient will need to rule out for postoperative infection or other complication.  His itching is  concerning for a liver etiology of symptoms.  We will also obtain liver function tests with ammonia and INR.  He will have urinalysis as well.  Patient will be given pain medicine, nausea medicine, and fluids.  Will give Benadryl if he has more itching.  Patient was informed he may have a gastroenteritis that has been feeling worse in the setting of his recent surgery however will rule out other abnormality first.  1:57 PM Imaging work-up shows concern  for 2.5 cm area of ring-enhancing fluid.  Unclear stability.  Laboratory testing showed slight elevation of lipase but otherwise was similar to prior.  General surgery was called and evaluated him.  They requested a HIDA scan, stool studies due to the diarrhea, and medicine to admit for further rehydration and monitoring.  They will see the patient in consultation.  Hospitalist medicine team called for admission.   Final Clinical Impressions(s) / ED Diagnoses   Final diagnoses:  Right upper quadrant abdominal pain  Nausea vomiting and diarrhea    ED Discharge Orders    None     Clinical Impression: 1. Right upper quadrant abdominal pain   2. Nausea vomiting and diarrhea     Disposition: Admit  This note was prepared with assistance of Dragon voice recognition software. Occasional wrong-word or sound-a-like substitutions may have occurred due to the inherent limitations of voice recognition software.     Tegeler, Gwenyth Allegra, MD 07/09/18 [phone]

## 2018-07-09 NOTE — H&P (Signed)
History and Physical    ANASTASIO WOGAN BZJ:[id number] DOB: 1932-04-01 DOA: 07/09/2018  I have briefly reviewed the patient's prior medical records in [place]  PCP: Marin Olp, MD  Patient coming from: home  Chief Complaint: nausea/vomiting diarrhea  HPI: Isaiah Davidson is a 84 y.o. male with medical history significant of hypertension, hyperlipidemia, probable chronic kidney disease stage III, mild cognitive impairment, seizure disorder, presents to the hospital with chief complaint of nausea vomiting and diarrhea.  Chart review reveals that he was recently hospitalized about 3 weeks ago, he presented with right upper quadrant abdominal pain, he was admitted to the hospital on surgery service because of initial work-up showing acute cholecystitis.  He underwent lap [place] for gangrenous cholecystitis, and he was on antibiotics while hospitalized and discharged on Augmentin.  His wife and daughter are at bedside.  Wife tells me that on Saturday evening patient started complaining of generalized itching and got better with Benadryl.  On Sunday morning he was complaining of generalized stomach pain, and started having nausea vomiting and diarrhea.  No blood in the emesis or the stools.  Reports watery diarrhea.  He also has been having subjective fever on Sunday evening.  He continued to feel worse overnight and this morning and family decided to bring him to the emergency room.  He also reports slight burning with urination over the last couple of days.  He denies any chest pain, denies any shortness of breath, denies any palpitations.  He denies any lightheadedness or dizziness  ED Course: In the emergency room patient's vital signs are stable, his initial blood work reveals slight hyponatremia with a sodium of 134, chloride 96, creatinine 1.5, elevated lipase of 101, CBC with white count of 5.8 and hemoglobin of 11.6.  His lactic acid was 1.5.  He underwent a CT scan of the abdomen and  pelvis which showed 2 rim-enhancing collections in the gallbladder fossa measuring up to 2.5 cm.  General surgery was consulted and we were asked to admit  Review of Systems: As per HPI otherwise 10 point review of systems negative.   Past Medical History:  Diagnosis Date  . HYPERLIPIDEMIA 02/22/2007  . HYPERTENSION 02/22/2007  . MCI (mild cognitive impairment) with memory loss 05/14/2014  . Seizures ([place])    treated for siezures around 2010    Past Surgical History:  Procedure Laterality Date  . CARPAL TUNNEL RELEASE    . CHOLECYSTECTOMY N/A 06/15/2018   Procedure: LAPAROSCOPIC CHOLECYSTECTOMY;  Surgeon: Kinsinger, Arta Bruce, MD;  Location: [facility];  Service: General;  Laterality: N/A;  . KNEE SURGERY     arthroscopic     reports that he quit smoking about 40 years ago. His smoking use included cigars and cigarettes. He has a 30.00 pack-year smoking history. He has never used smokeless tobacco. He reports that he does not drink alcohol or use drugs.  No Known Allergies  Family History  Problem Relation Age of Onset  . Diabetes Paternal Grandmother        raised by grandparents    Prior to Admission medications   Medication Sig Start Date End Date Taking? Authorizing Provider  acetaminophen (TYLENOL) 325 MG tablet Take 2 tablets (650 mg total) by mouth every 6 (six) hours as needed for mild pain (or temp > 100). 06/18/18   Rayburn, Floyce Stakes, PA-C  amLODipine (NORVASC) 10 MG tablet TAKE 1 TABLET EVERY DAY Patient taking differently: Take 10 mg by mouth daily.  03/21/18  Marin Olp, MD  chlorthalidone (HYGROTON) 25 MG tablet TAKE 1 TABLET EVERY DAY Patient taking differently: Take 25 mg by mouth daily.  03/21/18   Marin Olp, MD  donepezil (ARICEPT) 5 MG tablet Take 1 tablet (5 mg total) by mouth at bedtime. 11/14/17   Ward Givens, NP  HYDROcodone-acetaminophen (NORCO/VICODIN) 5-325 MG tablet Take 1 tablet by mouth every 4 (four) hours as needed for moderate pain or  severe pain. 06/18/18   Rayburn, Floyce Stakes, PA-C  levETIRAcetam (KEPPRA) 500 MG tablet Take 500 mg by mouth 2 (two) times daily.    [provider]  Multiple Vitamin (MULTIVITAMIN) capsule Take 1 capsule by mouth daily.      [provider]  simvastatin (ZOCOR) 40 MG tablet TAKE 1 TABLET EVERY DAY Patient taking differently: Take 40 mg by mouth daily.  03/21/18   Marin Olp, MD    Physical Exam: Vitals:   07/09/18 1230 07/09/18 1300 07/09/18 1400 07/09/18 1430  BP: 129/83 (!) 144/90 128/84 121/82  Pulse: 76 81 80 80  Resp: 14 17 17 15   Temp:      TempSrc:      SpO2: 96% 98% 96% 95%    Constitutional: NAD, calm, comfortable  Eyes: PERRL, lids and conjunctivae normal ENMT: Mucous membranes are moist. Neck: normal, supple, no masses Respiratory: clear to auscultation bilaterally, no wheezing, no crackles. Normal respiratory effort.  Cardiovascular: Regular rate and rhythm, 3/6 SEM. No extremity edema. 2+ pedal pulses.  Abdomen: no tenderness, no masses palpated. Bowel sounds positive.  Musculoskeletal: no clubbing / cyanosis. Normal muscle tone.  Skin: no rashes, lesions, ulcers. No induration Neurologic: CN 2-12 grossly intact. Strength 5/5 in all 4.  Psychiatric: Normal judgment and insight. Alert and oriented x 3. Normal mood.   Labs on Admission: I have personally reviewed following labs and imaging studies  CBC: Recent Labs  Lab 07/09/18 1123  WBC 5.8  NEUTROABS 3.8  HGB 11.6*  HCT 36.5*  MCV 86.5  PLT 628   Basic Metabolic Panel: Recent Labs  Lab 07/09/18 1123  NA 134*  K 3.6  CL 96*  CO2 24  GLUCOSE 114*  BUN 17  CREATININE 1.50*  CALCIUM 9.3   GFR: CrCl cannot be calculated (Unknown ideal weight.). Liver Function Tests: Recent Labs  Lab 07/09/18 1123  AST 22  ALT 15  ALKPHOS 68  BILITOT 0.8  PROT 7.5  ALBUMIN 3.2*   Recent Labs  Lab 07/09/18 1123  LIPASE 101*   Recent Labs  Lab 07/09/18 1123  AMMONIA 21    Coagulation Profile: Recent Labs  Lab 07/09/18 1123  INR 1.05   Cardiac Enzymes: No results for input(s): CKTOTAL, CKMB, CKMBINDEX, TROPONINI in the last 168 hours. BNP (last 3 results) No results for input(s): PROBNP in the last 8760 hours. HbA1C: No results for input(s): HGBA1C in the last 72 hours. CBG: No results for input(s): GLUCAP in the last 168 hours. Lipid Profile: No results for input(s): CHOL, HDL, LDLCALC, TRIG, CHOLHDL, LDLDIRECT in the last 72 hours. Thyroid Function Tests: No results for input(s): TSH, T4TOTAL, FREET4, T3FREE, THYROIDAB in the last 72 hours. Anemia Panel: No results for input(s): VITAMINB12, FOLATE, FERRITIN, TIBC, IRON, RETICCTPCT in the last 72 hours. Urine analysis:    Component Value Date/Time   COLORURINE YELLOW 07/09/2018 [place] 07/09/2018 1335   LABSPEC 1.015 07/09/2018 1335   PHURINE 8.0 07/09/2018 1335   GLUCOSEU NEGATIVE 07/09/2018 1335   HGBUR NEGATIVE  07/09/2018 [place] 07/09/2018 1335   BILIRUBINUR Negative 01/18/2018 [place] 07/09/2018 1335   PROTEINUR NEGATIVE 07/09/2018 1335   UROBILINOGEN 0.2 06/14/2018 1822   NITRITE NEGATIVE 07/09/2018 1335   LEUKOCYTESUR MODERATE (A) 07/09/2018 1335     Radiological Exams on Admission: Ct Abdomen Pelvis W Contrast  Result Date: 07/09/2018 CLINICAL DATA:  Right upper quadrant pain. Cholecystectomy 3 weeks ago EXAM: CT ABDOMEN AND PELVIS WITH CONTRAST TECHNIQUE: Multidetector CT imaging of the abdomen and pelvis was performed using the standard protocol following bolus administration of intravenous contrast. CONTRAST:  137mL OMNIPAQUE IOHEXOL 300 MG/ML  SOLN COMPARISON:  06/14/2018 FINDINGS: Lower chest:  Extensive coronary atherosclerotic calcification Hepatobiliary: Multiple hepatic cysts.Recent cholecystectomy with 2 low-density collections in the gallbladder fossa, both measuring up to 2.5 cm. The more proximal contains  mottled central lucency which may be gas within hemostatic material. Linear high densities within this gas containing collection may reflect clip/suture. No calcified choledocholithiasis. Pancreas: Main duct dilatation to 7 mm that is chronic when compared to 2011 abdominal CT. No evidence of pancreatitis. Spleen: Small subcapsular hypervascular focus superiorly, stable and benign compared to 2011 comparison Adrenals/Urinary Tract: Negative adrenals. Bilateral renal cysts with occasional thin mural calcification. 19 mm intermediate density lesion in the interpolar left kidney posteriorly, also described on most recent prior. There are additional renal lesions within densitometry higher than simple fluid but showing a more homogeneous internal appearance. No hydronephrosis. Negative urinary bladder Stomach/Bowel: No obstruction. No bowel inflammation, including appendicitis. Vascular/Lymphatic: Atherosclerotic calcification of the aorta and iliacs. No mass or adenopathy. Reproductive:Negative Other: No ascites or pneumoperitoneum. Musculoskeletal: No acute abnormalities. Hip and spine degenerative changes. IMPRESSION: 1. Two rim enhancing collections in the gallbladder fossa measuring up to 2.5 cm. Sterility is indeterminate. 2. Indeterminate renal lesions that could be complex cysts or solid, measuring up to 19 mm on the left. If appropriate for comorbidities an elective renal MRI could be obtained. Electronically Signed   By: Monte Fantasia M.D.   On: 07/09/2018 13:12    EKG: Independently reviewed.  Sinus rhythm with PVCs  Assessment/Plan Principal Problem:   Diarrhea Active Problems:   Hyperlipidemia   Essential hypertension   Seizure disorder (HCC)   MCI (mild cognitive impairment) with memory loss   Aortic atherosclerosis (HCC)  Principal problem Nausea, vomiting and diarrhea -Question if this is related to the CT findings in the gallbladder, general surgery consulted and recommends admission  for observation. -C. difficile is on the differential given recent exposure to antibiotics and hospitalization, will obtain PCR along with GI pathogen panel at the surgery recommendations -He is afebrile, has no leukocytosis, hold off empiric antibiotics -HIDA scan pending per surgery  Additional problems Suspect chronic kidney disease stage III -It is probable that he may have a degree of CKD, other than creatinine values from 2019 his most recent one is from 2011 -Creatinine currently appears stable when compared to this year's values, at 1.5 on admission  Possible urinary tract infection -Urinalysis with moderate leukocytes as well as bacteria present, patient does report slight burning with urination.  He is afebrile and has no leukocytosis, I would favor to hold antibiotics at this point up until his C. difficile is resulted.  A UTI may explain his nausea & vomiting but not necessarily diarrhea.  Seizure disorder -Continue home Keppra  Hypertension -Continue Norvasc, hold chlorthalidone  Hyperlipidemia -Continue statin  Mild cognitive impairment with memory loss -Continue Aricept   DVT prophylaxis: Lovenox  Code Status: Full code Family Communication: Family present at bedside Disposition Plan: Likely home when ready Consults called: General surgery   [name], MD, PhD [facility] Pager [phone]  If 7PM-7AM, please contact night-coverage [email or web address] Password [email or web address]  07/09/2018, 3:04 PM

## 2018-07-10 DIAGNOSIS — E785 Hyperlipidemia, unspecified: Secondary | ICD-10-CM | POA: Diagnosis not present

## 2018-07-10 DIAGNOSIS — A498 Other bacterial infections of unspecified site: Secondary | ICD-10-CM | POA: Diagnosis not present

## 2018-07-10 DIAGNOSIS — G40909 Epilepsy, unspecified, not intractable, without status epilepticus: Secondary | ICD-10-CM | POA: Diagnosis not present

## 2018-07-10 DIAGNOSIS — I10 Essential (primary) hypertension: Secondary | ICD-10-CM | POA: Diagnosis not present

## 2018-07-10 DIAGNOSIS — I700 Atherosclerosis of aorta: Secondary | ICD-10-CM | POA: Diagnosis not present

## 2018-07-10 DIAGNOSIS — G3184 Mild cognitive impairment, so stated: Secondary | ICD-10-CM | POA: Diagnosis not present

## 2018-07-10 DIAGNOSIS — R197 Diarrhea, unspecified: Secondary | ICD-10-CM | POA: Diagnosis not present

## 2018-07-10 LAB — GASTROINTESTINAL PANEL BY PCR, STOOL (REPLACES STOOL CULTURE)
Adenovirus F40/41: NOT DETECTED
Astrovirus: NOT DETECTED
CYCLOSPORA CAYETANENSIS: NOT DETECTED
Campylobacter species: NOT DETECTED
Cryptosporidium: NOT DETECTED
Entamoeba histolytica: NOT DETECTED
Enteroaggregative E coli (EAEC): NOT DETECTED
Enteropathogenic E coli (EPEC): NOT DETECTED
Enterotoxigenic E coli (ETEC): NOT DETECTED
Giardia lamblia: NOT DETECTED
Norovirus GI/GII: NOT DETECTED
PLESIMONAS SHIGELLOIDES: NOT DETECTED
Rotavirus A: NOT DETECTED
Salmonella species: NOT DETECTED
Sapovirus (I, II, IV, and V): NOT DETECTED
Shiga like toxin producing E coli (STEC): NOT DETECTED
Shigella/Enteroinvasive E coli (EIEC): NOT DETECTED
VIBRIO SPECIES: NOT DETECTED
Vibrio cholerae: NOT DETECTED
Yersinia enterocolitica: NOT DETECTED

## 2018-07-10 LAB — CBC
HCT: 30.3 % — ABNORMAL LOW (ref 39.0–52.0)
Hemoglobin: 10.2 g/dL — ABNORMAL LOW (ref 13.0–17.0)
MCH: 29.1 pg (ref 26.0–34.0)
MCHC: 33.7 g/dL (ref 30.0–36.0)
MCV: 86.3 fL (ref 80.0–100.0)
NRBC: 0 % (ref 0.0–0.2)
Platelets: 369 10*3/uL (ref 150–400)
RBC: 3.51 MIL/uL — AB (ref 4.22–5.81)
RDW: 12.7 % (ref 11.5–15.5)
WBC: 7.7 10*3/uL (ref 4.0–10.5)

## 2018-07-10 LAB — C DIFFICILE QUICK SCREEN W PCR REFLEX
C Diff antigen: NEGATIVE
C Diff interpretation: NOT DETECTED
C Diff toxin: NEGATIVE

## 2018-07-10 LAB — COMPREHENSIVE METABOLIC PANEL
ALBUMIN: 2.6 g/dL — AB (ref 3.5–5.0)
ALT: 12 U/L (ref 0–44)
AST: 17 U/L (ref 15–41)
Alkaline Phosphatase: 56 U/L (ref 38–126)
Anion gap: 9 (ref 5–15)
BILIRUBIN TOTAL: 0.5 mg/dL (ref 0.3–1.2)
BUN: 12 mg/dL (ref 8–23)
CO2: 27 mmol/L (ref 22–32)
Calcium: 8.7 mg/dL — ABNORMAL LOW (ref 8.9–10.3)
Chloride: 99 mmol/L (ref 98–111)
Creatinine, Ser: 1.46 mg/dL — ABNORMAL HIGH (ref 0.61–1.24)
GFR calc Af Amer: 50 mL/min — ABNORMAL LOW (ref >60)
GFR calc non Af Amer: 44 mL/min — ABNORMAL LOW (ref >60)
Glucose, Bld: 92 mg/dL (ref 70–99)
Potassium: 3.5 mmol/L (ref 3.5–5.1)
Sodium: 135 mmol/L (ref 135–145)
Total Protein: 6.2 g/dL — ABNORMAL LOW (ref 6.5–8.1)

## 2018-07-10 LAB — MAGNESIUM: Magnesium: 1.8 mg/dL (ref 1.7–2.4)

## 2018-07-10 LAB — PHOSPHORUS: Phosphorus: 3.4 mg/dL (ref 2.5–4.6)

## 2018-07-10 MED ORDER — SODIUM CHLORIDE 0.9 % IV SOLN
1.0000 g | INTRAVENOUS | Status: DC
  Administered 2018-07-10: 1 g via INTRAVENOUS
  Filled 2018-07-10: qty 10

## 2018-07-10 NOTE — Care Management Note (Addendum)
Case Management Note Manya Silvas, RN MSN CCM Transitions of Care [address] [name] [phone]  Patient Details  Name: Isaiah Davidson MRN: [id number] Date of Birth: 16-Dec-1931  Subjective/Objective:              diarrhea      Action/Plan: PTA home with family. Independent. Has cane but doesn't use it. No issues with transportation or medications. Has PCP. No HH or DME needs. Noted [facility] eligibility. Call to [place] for follow up. No transition of care needs identified.   Expected Discharge Date:                  Expected Discharge Plan:  Home/Self Care  In-House Referral:  NA  Discharge planning Services  CM Consult  Post Acute Care Choice:  NA Choice offered to:  NA  DME Arranged:  N/A DME Agency:  NA  HH Arranged:  NA HH Agency:  NA  Status of Service:  Completed, signed off  If discussed at [place] of Stay Meetings, dates discussed:    Additional Comments:  Isaiah Crews, RN 07/10/2018, 2:08 PM

## 2018-07-10 NOTE — Progress Notes (Signed)
Assessment & Plan: HD#2 - S/p laparoscopic cholecystectomy for gangrenous cholecystitis 06/15/18 by Dr. Kieth Brightly  CT scan and labs reviewed - no obvious complications from cholecystectomy  HIDA scan - negative for leak  Cdiff assay - negative  No evidence of post-surgical complication.  Small 2.5 cm collection in gallbladder fossa likely normal post op change from cholecystectomy 3 weeks ago.  OK to advance diet from surgical standpoint.  Only one loose BM overnight.  Further evaluation and discharge plans per medical service.  Will sign off - call if needed.  Will arrange post op visit to [place] office for wound check.        Armandina Gemma, MD       [facility]       Office: [phone]   Chief Complaint: Diarrhea  Subjective: Patient comfortable in bed, tolerating clear liquid diet.  One loose BM overnight.  Denies abdominal pain.  Objective: Vital signs in last 24 hours: Temp:  [98.1 F (36.7 C)-99.5 F (37.5 C)] 99.5 F (37.5 C) (12/31 0525) Pulse Rate:  [71-82] 82 (12/31 0525) Resp:  [14-18] 16 (12/31 0525) BP: (121-144)/(69-90) 129/75 (12/31 0525) SpO2:  [92 %-99 %] 95 % (12/31 0525) Weight:  [71 kg-71.8 kg] 71 kg (12/30 2330) Last BM Date: 07/09/18  Intake/Output from previous day: 12/30 0701 - 12/31 0700 In: 932.3 [P.O.:460; I.V.:472.3] Out: 650 [Urine:550; Stool:100] Intake/Output this shift: No intake/output data recorded.  Physical Exam: HEENT - sclerae clear, mucous membranes moist Neck - soft Chest - clear bilaterally Cor - RRR Abdomen - soft, non-distended; BS present; non-tender; wounds dry and intact; no mass RUQ Ext - no edema, non-tender Neuro - alert & oriented, no focal deficits  Lab Results:  Recent Labs    07/09/18 1123 07/10/18 0722  WBC 5.8 7.7  HGB 11.6* 10.2*  HCT 36.5* 30.3*  PLT 387 369   BMET Recent Labs    07/09/18 1123  NA 134*  K 3.6  CL 96*  CO2 24  GLUCOSE 114*  BUN 17  CREATININE 1.50*    CALCIUM 9.3   PT/INR Recent Labs    07/09/18 1123  LABPROT 13.7  INR 1.05   Comprehensive Metabolic Panel:    Component Value Date/Time   NA 134 (L) 07/09/2018 1123   NA 135 06/18/2018 1033   K 3.6 07/09/2018 1123   K 3.5 06/18/2018 1033   CL 96 (L) 07/09/2018 1123   CL 94 (L) 06/18/2018 1033   CO2 24 07/09/2018 1123   CO2 26 06/18/2018 1033   BUN 17 07/09/2018 1123   BUN 32 (H) 06/18/2018 1033   CREATININE 1.50 (H) 07/09/2018 1123   CREATININE 1.61 (H) 06/18/2018 1033   GLUCOSE 114 (H) 07/09/2018 1123   GLUCOSE 143 (H) 06/18/2018 1033   CALCIUM 9.3 07/09/2018 1123   CALCIUM 8.7 (L) 06/18/2018 1033   AST 22 07/09/2018 1123   AST 28 06/18/2018 1033   ALT 15 07/09/2018 1123   ALT 43 06/18/2018 1033   ALKPHOS 68 07/09/2018 1123   ALKPHOS 89 06/18/2018 1033   BILITOT 0.8 07/09/2018 1123   BILITOT 0.8 06/18/2018 1033   PROT 7.5 07/09/2018 1123   PROT 6.6 06/18/2018 1033   ALBUMIN 3.2 (L) 07/09/2018 1123   ALBUMIN 2.3 (L) 06/18/2018 1033    Studies/Results: Ct Abdomen Pelvis W Contrast  Result Date: 07/09/2018 CLINICAL DATA:  Right upper quadrant pain. Cholecystectomy 3 weeks ago EXAM: CT ABDOMEN AND PELVIS WITH CONTRAST TECHNIQUE: Multidetector  CT imaging of the abdomen and pelvis was performed using the standard protocol following bolus administration of intravenous contrast. CONTRAST:  178mL OMNIPAQUE IOHEXOL 300 MG/ML  SOLN COMPARISON:  06/14/2018 FINDINGS: Lower chest:  Extensive coronary atherosclerotic calcification Hepatobiliary: Multiple hepatic cysts.Recent cholecystectomy with 2 low-density collections in the gallbladder fossa, both measuring up to 2.5 cm. The more proximal contains mottled central lucency which may be gas within hemostatic material. Linear high densities within this gas containing collection may reflect clip/suture. No calcified choledocholithiasis. Pancreas: Main duct dilatation to 7 mm that is chronic when compared to 2011 abdominal CT. No  evidence of pancreatitis. Spleen: Small subcapsular hypervascular focus superiorly, stable and benign compared to 2011 comparison Adrenals/Urinary Tract: Negative adrenals. Bilateral renal cysts with occasional thin mural calcification. 19 mm intermediate density lesion in the interpolar left kidney posteriorly, also described on most recent prior. There are additional renal lesions within densitometry higher than simple fluid but showing a more homogeneous internal appearance. No hydronephrosis. Negative urinary bladder Stomach/Bowel: No obstruction. No bowel inflammation, including appendicitis. Vascular/Lymphatic: Atherosclerotic calcification of the aorta and iliacs. No mass or adenopathy. Reproductive:Negative Other: No ascites or pneumoperitoneum. Musculoskeletal: No acute abnormalities. Hip and spine degenerative changes. IMPRESSION: 1. Two rim enhancing collections in the gallbladder fossa measuring up to 2.5 cm. Sterility is indeterminate. 2. Indeterminate renal lesions that could be complex cysts or solid, measuring up to 19 mm on the left. If appropriate for comorbidities an elective renal MRI could be obtained. Electronically Signed   By: Monte Fantasia M.D.   On: 07/09/2018 13:12   Nm Hepato Biliary Leak  Result Date: 07/09/2018 CLINICAL DATA:  Status post laparoscopic cholecystectomy 06/15/2018. Patient complaining of nausea vomiting and diarrhea. Evaluate for possible bile leak. EXAM: NUCLEAR MEDICINE HEPATOBILIARY IMAGING TECHNIQUE: Sequential images of the abdomen were obtained out to 60 minutes following intravenous administration of radiopharmaceutical. RADIOPHARMACEUTICALS:  5.47 mCi Tc-19m  Choletec IV COMPARISON:  CT scan 07/09/2018 FINDINGS: Prompt and symmetric uptake in the liver and prompt excretion into the biliary tree by 10 minutes. Activity is seen in the small bowel by 15 minutes. No abnormal collection of the radiopharmaceutical to suggest a bile leak. IMPRESSION: Negative  hepatobiliary scan for bile leak. Electronically Signed   By: Marijo Sanes M.D.   On: 07/09/2018 16:25      Isaiah Davidson M 07/10/2018  Patient ID: Isaiah Davidson, male   DOB: February 10, 1932, 84 y.o.   MRN: [id number]

## 2018-07-10 NOTE — Progress Notes (Signed)
PROGRESS NOTE    Isaiah Davidson  HKV:[id number] DOB: 08-31-1931 DOA: 07/09/2018 PCP: Marin Olp, MD   Brief Narrative:  HPI per Dr. [name] on 07/09/18 Isaiah Davidson is a 84 y.o. male with medical history significant of hypertension, hyperlipidemia, probable chronic kidney disease stage III, mild cognitive impairment, seizure disorder, presents to the hospital with chief complaint of nausea vomiting and diarrhea.  Chart review reveals that he was recently hospitalized about 3 weeks ago, he presented with right upper quadrant abdominal pain, he was admitted to the hospital on surgery service because of initial work-up showing acute cholecystitis.  He underwent lap Coley for gangrenous cholecystitis, and he was on antibiotics while hospitalized and discharged on Augmentin.  His wife and daughter are at bedside.  Wife tells me that on Saturday evening patient started complaining of generalized itching and got better with Benadryl.  On Sunday morning he was complaining of generalized stomach pain, and started having nausea vomiting and diarrhea.  No blood in the emesis or the stools.  Reports watery diarrhea.  He also has been having subjective fever on Sunday evening.  He continued to feel worse overnight and this morning and family decided to bring him to the emergency room.  He also reports slight burning with urination over the last couple of days.  He denies any chest pain, denies any shortness of breath, denies any palpitations.  He denies any lightheadedness or dizziness  ED Course: In the emergency room patient's vital signs are stable, his initial blood work reveals slight hyponatremia with a sodium of 134, chloride 96, creatinine 1.5, elevated lipase of 101, CBC with white count of 5.8 and hemoglobin of 11.6.  His lactic acid was 1.5.  He underwent a CT scan of the abdomen and pelvis which showed 2 rim-enhancing collections in the gallbladder fossa measuring up to 2.5 cm.  General  surgery was consulted and we were asked to admit  **No evidence of bile leak and general surgery recommended advancing diet.  Urine culture grew out Proteus will empirically treat as patient was having lower urinary tract symptoms and nausea and vomiting.  Assessment & Plan:   Principal Problem:   Diarrhea Active Problems:   Hyperlipidemia   Essential hypertension   Seizure disorder (HCC)   MCI (mild cognitive impairment) with memory loss   Aortic atherosclerosis (HCC)  Nausea, Vomiting and Diarrhea, improving  -Question if this is related to the CT findings in the gallbladder, general surgery consulted and recommends admission for observation. -C. difficile is on the differential given recent exposure to antibiotics and hospitalization, will obtain PCR along with GI pathogen panel at the surgery recommendations and this was Negative -GI Pathogen Panel was also Negative  -LA was 1.53 -He is afebrile, has no leukocytosis, hold off empiric antibiotics -HIDA scan per surgery showed no evidence of Bile Leak -General Surgery evaluated and they feel that there is no evidence of a postsurgical complication.  There is a small 2.5 cm collection in the gallbladder fossa which is likely normal postoperative changes from cholecystectomy 2 weeks ago.  General surgery recommending advancing diet -Will advance from Clear Liquid to Full Liquid and if able to tolerate will advance to Soft Diet  -Follow up [facility] office for wound check   Suspect chronic kidney disease stage III -It is probable that he may have a degree of CKD, other than creatinine values from 2019 his most recent one is from 2011 -Creatinine currently appears stable when compared  to this year's values, at 1.5 on admission -BUN/Cr is now 12/1.46 today -Avoid Nephrotoxic Medications and Contrasts if possible and hold Hoke Chlorthalidone -Continue to Monitor and repeat CMP in AM   Likely Urinary Tract Infection -Urinalysis with moderate  leukocytes as well as bacteria present, patient does report slight burning with urination.   -He is afebrile and has no leukocytosis (7.7) but since he is symptomatic will Empirically treat -A UTI may explain his nausea & vomiting but not necessarily diarrhea. -Urine Cx showed >100,000 Proteus Mirabilis -Will start on Empiric Abx with IV Ceftriaxone   Seizure Disorder -Continue home Levetiracetam 500 mg po BID  Hypertension -Continue Amlodipine 10 mg po Daily, hold Chlorthalidone 25 mg po Daily   Hyperlipidemia -Continue Simvastatin 40 mg po Daily   Mild cognitive impairment with memory loss -Continue Donepezil 5 mg po qHS   Normocytic Anemia -Patient's Hb/Hct went from 13.3/40.7 -> 10.2/30.3 over the last few weeks -Likely in the setting of CKD -Continue to Monitor for S/Sx of Bleeding -Check Anemia Panel in AM  -Repeat CBC in AM   DVT prophylaxis: Enoxaparin 40 mg sq q24h Code Status: FULL CODE Family Communication: Discussed with Family at bedside Disposition Plan: Anticipate D/C Home in the next 24-48 hours   Consultants:   General Surgery    Procedures:  HIDA Scan  Antimicrobials:  Anti-infectives (From admission, onward)   None     Subjective: Seen and examined at bedside and was doing better. Had no complaints today and no CP, SOB, Nausea or Vomiting. Had one loose BM overnight.   Objective: Vitals:   07/09/18 1754 07/09/18 2143 07/09/18 2330 07/10/18 0525  BP: 134/75  121/69 129/75  Pulse: 76  81 82  Resp: 18  16 16   Temp: 99 F (37.2 C)  99.5 F (37.5 C) 99.5 F (37.5 C)  TempSrc: Oral  Oral Oral  SpO2: 99%  96% 95%  Weight: 71.8 kg  71 kg   Height:  5\' 9"  (1.753 m)      Intake/Output Summary (Last 24 hours) at 07/10/2018 0810 Last data filed at 07/10/2018 1740 Gross per 24 hour  Intake 932.27 ml  Output 650 ml  Net 282.27 ml   Filed Weights   07/09/18 1754 07/09/18 2330  Weight: 71.8 kg 71 kg   Examination: Physical  Exam:  Constitutional: WN/WD AAM in NAD and appears calm and comfortable Eyes:  Lids and conjunctivae normal, sclerae anicteric  ENMT: External Ears, Nose appear normal. Grossly normal hearing. Mucous membranes are moist. Neck: Appears normal, supple, no cervical masses, normal ROM, no appreciable thyromegaly; no JVD Respiratory: Diminished to auscultation bilaterally, no wheezing, rales, rhonchi or crackles. Normal respiratory effort and patient is not tachypenic. No accessory muscle use.  Cardiovascular: RRR, no murmurs / rubs / gallops. S1 and S2 auscultated. No extremity edema.  Abdomen: Soft, mildly tender, non-distended. No masses palpated. No appreciable hepatosplenomegaly. Bowel sounds positive.  GU: Deferred. Musculoskeletal: No clubbing / cyanosis of digits/nails. No joint deformity upper and lower extremities. Good ROM, no contractures. Normal strength and muscle tone.  Skin: No rashes, lesions, ulcers on a limited skin evaluation. No induration; Warm and dry.  Neurologic: CN 2-12 grossly intact with no focal deficits. Romberg sign and cerebellar reflexes not assessed.  Psychiatric: Normal judgment and insight. Alert and oriented x 3. Normal mood and appropriate affect.   Data Reviewed: I have personally reviewed following labs and imaging studies  CBC: Recent Labs  Lab 07/09/18 1123  WBC  5.8  NEUTROABS 3.8  HGB 11.6*  HCT 36.5*  MCV 86.5  PLT 035   Basic Metabolic Panel: Recent Labs  Lab 07/09/18 1123  NA 134*  K 3.6  CL 96*  CO2 24  GLUCOSE 114*  BUN 17  CREATININE 1.50*  CALCIUM 9.3   GFR: Estimated Creatinine Clearance: 36.7 mL/min (A) (by C-G formula based on SCr of 1.5 mg/dL (H)). Liver Function Tests: Recent Labs  Lab 07/09/18 1123  AST 22  ALT 15  ALKPHOS 68  BILITOT 0.8  PROT 7.5  ALBUMIN 3.2*   Recent Labs  Lab 07/09/18 1123  LIPASE 101*   Recent Labs  Lab 07/09/18 1123  AMMONIA 21   Coagulation Profile: Recent Labs  Lab  07/09/18 1123  INR 1.05   Cardiac Enzymes: No results for input(s): CKTOTAL, CKMB, CKMBINDEX, TROPONINI in the last 168 hours. BNP (last 3 results) No results for input(s): PROBNP in the last 8760 hours. HbA1C: No results for input(s): HGBA1C in the last 72 hours. CBG: No results for input(s): GLUCAP in the last 168 hours. Lipid Profile: No results for input(s): CHOL, HDL, LDLCALC, TRIG, CHOLHDL, LDLDIRECT in the last 72 hours. Thyroid Function Tests: No results for input(s): TSH, T4TOTAL, FREET4, T3FREE, THYROIDAB in the last 72 hours. Anemia Panel: No results for input(s): VITAMINB12, FOLATE, FERRITIN, TIBC, IRON, RETICCTPCT in the last 72 hours. Sepsis Labs: Recent Labs  Lab 07/09/18 1135  LATICACIDVEN 1.53   Recent Results (from the past 240 hour(s))  C difficile quick scan w PCR reflex     Status: None   Collection Time: 07/10/18  1:59 AM  Result Value Ref Range Status   C Diff antigen NEGATIVE NEGATIVE Final   C Diff toxin NEGATIVE NEGATIVE Final   C Diff interpretation No C. difficile detected.  Final    Comment: Performed at [facility], [place] [address], [place], [place] [address]    Radiology Studies: Ct Abdomen Pelvis W Contrast  Result Date: 07/09/2018 CLINICAL DATA:  Right upper quadrant pain. Cholecystectomy 3 weeks ago EXAM: CT ABDOMEN AND PELVIS WITH CONTRAST TECHNIQUE: Multidetector CT imaging of the abdomen and pelvis was performed using the standard protocol following bolus administration of intravenous contrast. CONTRAST:  120mL OMNIPAQUE IOHEXOL 300 MG/ML  SOLN COMPARISON:  06/14/2018 FINDINGS: Lower chest:  Extensive coronary atherosclerotic calcification Hepatobiliary: Multiple hepatic cysts.Recent cholecystectomy with 2 low-density collections in the gallbladder fossa, both measuring up to 2.5 cm. The more proximal contains mottled central lucency which may be gas within hemostatic material. Linear high densities within this gas containing collection  may reflect clip/suture. No calcified choledocholithiasis. Pancreas: Main duct dilatation to 7 mm that is chronic when compared to 2011 abdominal CT. No evidence of pancreatitis. Spleen: Small subcapsular hypervascular focus superiorly, stable and benign compared to 2011 comparison Adrenals/Urinary Tract: Negative adrenals. Bilateral renal cysts with occasional thin mural calcification. 19 mm intermediate density lesion in the interpolar left kidney posteriorly, also described on most recent prior. There are additional renal lesions within densitometry higher than simple fluid but showing a more homogeneous internal appearance. No hydronephrosis. Negative urinary bladder Stomach/Bowel: No obstruction. No bowel inflammation, including appendicitis. Vascular/Lymphatic: Atherosclerotic calcification of the aorta and iliacs. No mass or adenopathy. Reproductive:Negative Other: No ascites or pneumoperitoneum. Musculoskeletal: No acute abnormalities. Hip and spine degenerative changes. IMPRESSION: 1. Two rim enhancing collections in the gallbladder fossa measuring up to 2.5 cm. Sterility is indeterminate. 2. Indeterminate renal lesions that could be complex cysts or solid, measuring  up to 19 mm on the left. If appropriate for comorbidities an elective renal MRI could be obtained. Electronically Signed   By: Monte Fantasia M.D.   On: 07/09/2018 13:12   Nm Hepato Biliary Leak  Result Date: 07/09/2018 CLINICAL DATA:  Status post laparoscopic cholecystectomy 06/15/2018. Patient complaining of nausea vomiting and diarrhea. Evaluate for possible bile leak. EXAM: NUCLEAR MEDICINE HEPATOBILIARY IMAGING TECHNIQUE: Sequential images of the abdomen were obtained out to 60 minutes following intravenous administration of radiopharmaceutical. RADIOPHARMACEUTICALS:  5.47 mCi Tc-64m  Choletec IV COMPARISON:  CT scan 07/09/2018 FINDINGS: Prompt and symmetric uptake in the liver and prompt excretion into the biliary tree by 10  minutes. Activity is seen in the small bowel by 15 minutes. No abnormal collection of the radiopharmaceutical to suggest a bile leak. IMPRESSION: Negative hepatobiliary scan for bile leak. Electronically Signed   By: Marijo Sanes M.D.   On: 07/09/2018 16:25   Scheduled Meds: . amLODipine  10 mg Oral Daily  . donepezil  5 mg Oral QHS  . enoxaparin (LOVENOX) injection  40 mg Subcutaneous Q24H  . levETIRAcetam  500 mg Oral BID  . multivitamin with minerals  1 tablet Oral Daily  . simvastatin  40 mg Oral Daily   Continuous Infusions: . lactated ringers 50 mL/hr at 07/09/18 2053    LOS: 0 days   Kerney Elbe, DO [facility] PAGER is on AMION  If 7PM-7AM, please contact night-coverage [email or web address] Password [email or web address] 07/10/2018, 8:10 AM

## 2018-07-10 NOTE — Care Management Obs Status (Signed)
[facility] NOTIFICATION   Patient Details  Name: Isaiah Davidson MRN: [id number] Date of Birth: [birth year]   Medicare Observation Status Notification Given:  Yes    Bartholomew Crews, RN 07/10/2018, 2:07 PM

## 2018-07-11 DIAGNOSIS — G40909 Epilepsy, unspecified, not intractable, without status epilepticus: Secondary | ICD-10-CM | POA: Diagnosis not present

## 2018-07-11 DIAGNOSIS — I700 Atherosclerosis of aorta: Secondary | ICD-10-CM | POA: Diagnosis not present

## 2018-07-11 DIAGNOSIS — I10 Essential (primary) hypertension: Secondary | ICD-10-CM | POA: Diagnosis not present

## 2018-07-11 DIAGNOSIS — R197 Diarrhea, unspecified: Secondary | ICD-10-CM | POA: Diagnosis not present

## 2018-07-11 DIAGNOSIS — A498 Other bacterial infections of unspecified site: Secondary | ICD-10-CM | POA: Diagnosis not present

## 2018-07-11 DIAGNOSIS — G3184 Mild cognitive impairment, so stated: Secondary | ICD-10-CM | POA: Diagnosis not present

## 2018-07-11 DIAGNOSIS — E785 Hyperlipidemia, unspecified: Secondary | ICD-10-CM | POA: Diagnosis not present

## 2018-07-11 LAB — CBC WITH DIFFERENTIAL/PLATELET
Abs Immature Granulocytes: 0.02 10*3/uL (ref 0.00–0.07)
Basophils Absolute: 0 10*3/uL (ref 0.0–0.1)
Basophils Relative: 0 %
Eosinophils Absolute: 0 10*3/uL (ref 0.0–0.5)
Eosinophils Relative: 1 %
HCT: 28.9 % — ABNORMAL LOW (ref 39.0–52.0)
HEMOGLOBIN: 9.7 g/dL — AB (ref 13.0–17.0)
Immature Granulocytes: 0 %
Lymphocytes Relative: 41 %
Lymphs Abs: 3 10*3/uL (ref 0.7–4.0)
MCH: 28.9 pg (ref 26.0–34.0)
MCHC: 33.6 g/dL (ref 30.0–36.0)
MCV: 86 fL (ref 80.0–100.0)
MONO ABS: 0.4 10*3/uL (ref 0.1–1.0)
Monocytes Relative: 6 %
Neutro Abs: 3.8 10*3/uL (ref 1.7–7.7)
Neutrophils Relative %: 52 %
Platelets: 338 10*3/uL (ref 150–400)
RBC: 3.36 MIL/uL — ABNORMAL LOW (ref 4.22–5.81)
RDW: 12.4 % (ref 11.5–15.5)
WBC: 7.2 10*3/uL (ref 4.0–10.5)
nRBC: 0 % (ref 0.0–0.2)

## 2018-07-11 LAB — URINE CULTURE

## 2018-07-11 LAB — COMPREHENSIVE METABOLIC PANEL
ALK PHOS: 49 U/L (ref 38–126)
ALT: 10 U/L (ref 0–44)
AST: 18 U/L (ref 15–41)
Albumin: 2.4 g/dL — ABNORMAL LOW (ref 3.5–5.0)
Anion gap: 10 (ref 5–15)
BUN: 11 mg/dL (ref 8–23)
CO2: 28 mmol/L (ref 22–32)
CREATININE: 1.44 mg/dL — AB (ref 0.61–1.24)
Calcium: 8.3 mg/dL — ABNORMAL LOW (ref 8.9–10.3)
Chloride: 97 mmol/L — ABNORMAL LOW (ref 98–111)
GFR calc Af Amer: 51 mL/min — ABNORMAL LOW (ref >60)
GFR calc non Af Amer: 44 mL/min — ABNORMAL LOW (ref >60)
Glucose, Bld: 86 mg/dL (ref 70–99)
Potassium: 3.8 mmol/L (ref 3.5–5.1)
SODIUM: 135 mmol/L (ref 135–145)
Total Bilirubin: 0.5 mg/dL (ref 0.3–1.2)
Total Protein: 5.8 g/dL — ABNORMAL LOW (ref 6.5–8.1)

## 2018-07-11 LAB — PHOSPHORUS: Phosphorus: 3.4 mg/dL (ref 2.5–4.6)

## 2018-07-11 LAB — MAGNESIUM: Magnesium: 1.8 mg/dL (ref 1.7–2.4)

## 2018-07-11 MED ORDER — IPRATROPIUM-ALBUTEROL 0.5-2.5 (3) MG/3ML IN SOLN
3.0000 mL | Freq: Four times a day (QID) | RESPIRATORY_TRACT | Status: DC
  Administered 2018-07-11: 3 mL via RESPIRATORY_TRACT
  Filled 2018-07-11: qty 3

## 2018-07-11 MED ORDER — GUAIFENESIN ER 600 MG PO TB12: 1200.0000 mg | ORAL_TABLET | Freq: Two times a day (BID) | ORAL | Status: DC

## 2018-07-11 MED ORDER — CEPHALEXIN 500 MG PO CAPS: 500.0000 mg | ORAL_CAPSULE | Freq: Two times a day (BID) | ORAL | 0 refills | Status: AC

## 2018-07-11 MED ORDER — CEPHALEXIN 500 MG PO CAPS
500.0000 mg | ORAL_CAPSULE | Freq: Two times a day (BID) | ORAL | Status: DC
  Administered 2018-07-11: 500 mg via ORAL
  Filled 2018-07-11: qty 1

## 2018-07-11 NOTE — Progress Notes (Signed)
Patient discharged to home, removed IV before discharge, reviewed the discharge instructions, follow-up appointments and prescriptions, verbalized understanding.  Patient escorted out of the unit in w/c, took all his belongings with him.

## 2018-07-11 NOTE — Discharge Summary (Signed)
Physician Discharge Summary  Isaiah Davidson CZY:[id number] DOB: 1931-09-27 DOA: 07/09/2018  PCP: Marin Olp, MD  Admit date: 07/09/2018 Discharge date: 07/11/2018  Admitted From: Home Disposition:  Home  Recommendations for Outpatient Follow-up:  1. Follow up with PCP in 1-2 weeks 2. Follow up with General Surgery as an outpatient in 1-2 weeks 3. Please obtain CMP/CBC, Mag, Phos in one week 4. Please follow up on the following pending results:  Home Health: No Equipment/Devices: None    Discharge Condition: Stable  CODE STATUS: FULL CODE Diet recommendation: Soft Heart Healthy Diet  Brief/Interim Summary: HPI per Dr. [name] on 07/09/18 Isaiah Route Tayloris a 86 y.o.malewith medical history significant ofhypertension, hyperlipidemia, probable chronic kidney disease stage III, mild cognitive impairment, seizure disorder, presents to the hospital with chief complaint of nausea vomiting and diarrhea. Chart review reveals that he was recently hospitalized about 3 weeks ago, he presented with right upper quadrant abdominal pain, he was admitted to the hospital on surgery service because of initial work-up showing acute cholecystitis. He underwent lap Coley for gangrenous cholecystitis, and he was on antibiotics while hospitalized and discharged on Augmentin. His wife and daughter are at bedside. Wife tells me that on Saturday evening patient started complaining of generalized itching and got better with Benadryl. On Sunday morning he was complaining of generalized stomach pain, and started having nausea vomiting and diarrhea. No blood in the emesis or the stools. Reports watery diarrhea. He also has been having subjective fever on Sunday evening. He continuedto feel worse overnight and this morning and family decided to bring him to the emergency room.He also reports slight burning with urination over the last couple of days. He denies any chest pain, denies any shortness  of breath, denies any palpitations. He denies any lightheadedness or dizziness  ED Course:In the emergency room patient's vital signs are stable, his initial blood work reveals slight hyponatremia with a sodium of 134, chloride 96, creatinine 1.5, elevated lipase of 101, CBC with white count of 5.8 and hemoglobin of 11.6. His lactic acid was 1.5. He underwent a CT scan of the abdomen and pelvis which showed 2 rim-enhancing collections in the gallbladder fossa measuring up to 2.5 cm. General surgery was consulted and we were asked to admit  **No evidence of bile leak and general surgery recommended advancing diet.  Urine culture grew out Proteus will empirically treat as patient was having lower urinary tract symptoms and nausea and vomiting. Nausea, vomiting, and abdominal Pain improved. Patient's Urine Cx was only resistant to Nitrofurantoin so changed to po Keflex. Stable to D/C and will need to follow up with PCP and General Surgery within 1 week.   Discharge Diagnoses:  Principal Problem:   Diarrhea Active Problems:   Hyperlipidemia   Essential hypertension   Seizure disorder (HCC)   MCI (mild cognitive impairment) with memory loss   Aortic atherosclerosis (HCC)  Nausea, Vomiting and Diarrhea, improved -Question if this is related to the CT findings in the gallbladder, general surgery consulted and recommends admission for observation. -C. difficile is on the differential given recent exposure to antibiotics and hospitalization, will obtain PCR along with GI pathogen panel at the surgery recommendations and this was Negative -GI Pathogen Panel was also Negative  -LA was 1.53 -He is afebrile, has no leukocytosis but has symptoms of a UTI -HIDA scan per surgery showed no evidence of Bile Leak -General Surgery evaluated and they feel that there is no evidence of a postsurgical complication.  There is a small 2.5 cm collection in the gallbladder fossa which is likely normal postoperative  changes from cholecystectomy 2 weeks ago.  General surgery recommending advancing diet -Able to tolerate Soft Diet without issues  -Follow up [facility] office for wound check   Suspect chronic kidney disease stage III -It is probable that he mayhavea degree of CKD,other than creatinine values from 2019 his most recent one is from 2011 -Creatinine currently appears stable when compared to this year's values, at 1.5 on admission -BUN/Cr is now 11/1.44 oday -Avoid Nephrotoxic Medications and Contrasts if possible and hold Chlorthalidone while hospitalized and resume as an outpatient  -Continue to Monitor and repeat CMP within 1 week    Proteus Urinary Tract Infection -Urinalysis with moderate leukocytes as well as bacteria present, patient does report slight burning with urination.  -He is afebrile and has no leukocytosis (7.7) but since he is symptomatic will Empirically treat -A UTI may explain his nausea &vomiting but not necessarily diarrhea. -Urine Cx showed >100,000 Proteus Mirabilis that was only Resistant to Nitrofurantoin  -Started on Empiric Abx with IV Ceftriaxone and will change to po Keflex for total of 7 days of therapy    Seizure Disorder -Continue home Levetiracetam 500 mg po BID  Hypertension -Continue Amlodipine 10 mg po Daily, hold Chlorthalidone 25 mg po Daily   Hyperlipidemia -Continue Simvastatin 40 mg po Daily   Mild cognitive impairment with memory loss -Continue Donepezil 5 mg po qHS   Normocytic Anemia -Patient's Hb/Hct went from 13.3/40.7 -> 10.2/30.3 over the last few weeks and today was 9.7/28.9 -Likely in the setting of CKD and ? Dilutional Drop -Continue to Monitor for S/Sx of Bleeding and currently no Signs -Check Anemia Panel as an outpatient -Repeat CBC within 1 week   Discharge Instructions  Discharge Instructions    Call MD for:  difficulty breathing, headache or visual disturbances   Complete by:  As directed    Call MD for:  extreme  fatigue   Complete by:  As directed    Call MD for:  hives   Complete by:  As directed    Call MD for:  persistant dizziness or light-headedness   Complete by:  As directed    Call MD for:  persistant nausea and vomiting   Complete by:  As directed    Call MD for:  redness, tenderness, or signs of infection (pain, swelling, redness, odor or green/yellow discharge around incision site)   Complete by:  As directed    Call MD for:  severe uncontrolled pain   Complete by:  As directed    Call MD for:  temperature >100.4   Complete by:  As directed    Diet - low sodium heart healthy   Complete by:  As directed    Soft Diet   Discharge instructions   Complete by:  As directed    You were cared for by a hospitalist during your hospital stay. If you have any questions about your discharge medications or the care you received while you were in the hospital after you are discharged, you can call the unit and ask to speak with the hospitalist on call if the hospitalist that took care of you is not available. Once you are discharged, your primary care physician will handle any further medical issues. Please note that NO REFILLS for any discharge medications will be authorized once you are discharged, as it is imperative that you return to your primary care physician (or  establish a relationship with a primary care physician if you do not have one) for your aftercare needs so that they can reassess your need for medications and monitor your lab values.  Follow up with PCP and General Surgery. Take all medications as prescribed. If symptoms change or worsen please return to the ED for evaluation   Increase activity slowly   Complete by:  As directed      Allergies as of 07/11/2018   No Known Allergies     Medication List    TAKE these medications   acetaminophen 325 MG tablet Commonly known as:  TYLENOL Take 2 tablets (650 mg total) by mouth every 6 (six) hours as needed for mild pain (or temp >  100).   amLODipine 10 MG tablet Commonly known as:  NORVASC TAKE 1 TABLET EVERY DAY   cephALEXin 500 MG capsule Commonly known as:  KEFLEX Take 1 capsule (500 mg total) by mouth every 12 (twelve) hours for 6 days.   chlorthalidone 25 MG tablet Commonly known as:  HYGROTON TAKE 1 TABLET EVERY DAY   donepezil 5 MG tablet Commonly known as:  ARICEPT Take 1 tablet (5 mg total) by mouth at bedtime.   HYDROcodone-acetaminophen 5-325 MG tablet Commonly known as:  NORCO/VICODIN Take 1 tablet by mouth every 4 (four) hours as needed for moderate pain or severe pain.   levETIRAcetam 500 MG tablet Commonly known as:  KEPPRA Take 500 mg by mouth 2 (two) times daily.   multivitamin capsule Take 1 capsule by mouth daily.   simvastatin 40 MG tablet Commonly known as:  ZOCOR TAKE 1 TABLET EVERY DAY       No Known Allergies  Consultations:  General Surgery  Procedures/Studies: Dg Chest 2 View  Result Date: 06/14/2018 CLINICAL DATA:  Abdominal pain, nausea, vomiting and diarrhea for the past 3 days. Ex-smoker. EXAM: CHEST - 2 VIEW COMPARISON:  06/09/2016. FINDINGS: Normal sized heart. The previously demonstrated left diaphragmatic eventration is smaller. Interval linear densities at the right lung base and increased linear densities at the left lung base. Mild peribronchial thickening. Mild bilateral shoulder degenerative changes. IMPRESSION: Interval bibasilar linear atelectasis or scarring and mild bronchitic changes. Electronically Signed   By: Claudie Revering M.D.   On: 06/14/2018 19:22   Ct Abdomen Pelvis W Contrast  Result Date: 07/09/2018 CLINICAL DATA:  Right upper quadrant pain. Cholecystectomy 3 weeks ago EXAM: CT ABDOMEN AND PELVIS WITH CONTRAST TECHNIQUE: Multidetector CT imaging of the abdomen and pelvis was performed using the standard protocol following bolus administration of intravenous contrast. CONTRAST:  144mL OMNIPAQUE IOHEXOL 300 MG/ML  SOLN COMPARISON:  06/14/2018  FINDINGS: Lower chest:  Extensive coronary atherosclerotic calcification Hepatobiliary: Multiple hepatic cysts.Recent cholecystectomy with 2 low-density collections in the gallbladder fossa, both measuring up to 2.5 cm. The more proximal contains mottled central lucency which may be gas within hemostatic material. Linear high densities within this gas containing collection may reflect clip/suture. No calcified choledocholithiasis. Pancreas: Main duct dilatation to 7 mm that is chronic when compared to 2011 abdominal CT. No evidence of pancreatitis. Spleen: Small subcapsular hypervascular focus superiorly, stable and benign compared to 2011 comparison Adrenals/Urinary Tract: Negative adrenals. Bilateral renal cysts with occasional thin mural calcification. 19 mm intermediate density lesion in the interpolar left kidney posteriorly, also described on most recent prior. There are additional renal lesions within densitometry higher than simple fluid but showing a more homogeneous internal appearance. No hydronephrosis. Negative urinary bladder Stomach/Bowel: No obstruction. No bowel inflammation, including  appendicitis. Vascular/Lymphatic: Atherosclerotic calcification of the aorta and iliacs. No mass or adenopathy. Reproductive:Negative Other: No ascites or pneumoperitoneum. Musculoskeletal: No acute abnormalities. Hip and spine degenerative changes. IMPRESSION: 1. Two rim enhancing collections in the gallbladder fossa measuring up to 2.5 cm. Sterility is indeterminate. 2. Indeterminate renal lesions that could be complex cysts or solid, measuring up to 19 mm on the left. If appropriate for comorbidities an elective renal MRI could be obtained. Electronically Signed   By: Monte Fantasia M.D.   On: 07/09/2018 13:12   Ct Abdomen Pelvis W Contrast  Result Date: 06/14/2018 CLINICAL DATA:  Abdominal pain EXAM: CT ABDOMEN AND PELVIS WITH CONTRAST TECHNIQUE: Multidetector CT imaging of the abdomen and pelvis was  performed using the standard protocol following bolus administration of intravenous contrast. CONTRAST:  170mL OMNIPAQUE IOHEXOL 300 MG/ML  SOLN COMPARISON:  Ultrasound 01/28/2015.  CT 10/07/2009. FINDINGS: Lower chest: Linear scarring or atelectasis in the lung bases. Cardiomegaly. Hepatobiliary: Scattered cysts within the liver, stable. Gallbladder is moderately distended. Small stones or polyps noted along the gallbladder wall. Slight inflammation noted around the gallbladder. : No focal abnormality or ductal dilatation. Spleen: No focal abnormality.  Normal size. Adrenals/Urinary Tract: Multiple bilateral renal cysts. There is a new 1.8 cm low-density lesion in the mid to lower pole of the left kidney on image 32 which appears to demonstrate some internal enhancement concerning for a solid renal lesion. No hydronephrosis. Adrenal glands and urinary bladder unremarkable. Stomach/Bowel: Sigmoid diverticulosis. No active diverticulitis. Appendix is normal. No evidence of bowel obstruction. Vascular/Lymphatic: Aortic atherosclerosis. No enlarged abdominal or pelvic lymph nodes. Reproductive: No visible focal abnormality. Other: No free fluid or free air. Musculoskeletal: No acute bony abnormality. Diffuse degenerative disc and facet disease in the lumbar spine. IMPRESSION: Mildly distended gallbladder. There are noncalcified structures along the gallbladder wall which could reflect gallstones or polyps. There is mild stranding/inflammation around the pancreas. Cannot exclude cholecystitis. New small low-density lesion in the mid to lower pole of the left kidney measures 1.8 cm. Suggestion of possible internal enhancement. Cannot exclude solid enhancing lesion. This could be further evaluated with elective MRI if felt clinically indicated. Multiple bilateral renal cysts. Aortic atherosclerosis. Sigmoid diverticulosis. Bibasilar scarring or atelectasis.  Cardiomegaly. Electronically Signed   By: Rolm Baptise M.D.    On: 06/14/2018 21:48   US Abdomen Limited  Result Date: 06/14/2018 CLINICAL DATA:  Initial evaluation for acute abdominal pain, nausea, vomiting, diarrhea. EXAM: ULTRASOUND ABDOMEN LIMITED RIGHT UPPER QUADRANT COMPARISON:  Prior CT from earlier the same day. FINDINGS: Gallbladder: Gallbladder distended with heterogeneous echogenic material within the gallbladder lumen, likely sludge and/or sludge balls. 1.8 cm hyperechoic structure with posterior acoustic shadowing likely reflects a superimposed stone. Gallbladder wall thickened up to 7.2 mm with mild mural edema. No free pericholecystic fluid. No sonographic Murphy sign elicited on exam. Common bile duct: Diameter: 5.7 mm Liver: 1.5 cm cyst noted within the left hepatic lobe. Additional previously seen liver cysts not well visualized by sonography. Mildly increased echotexture of the hepatic parenchyma suggesting mild steatosis. Portal vein not assessed on this examination. IMPRESSION: 1. Heterogeneous echogenic and hyperechoic material within the gallbladder lumen, most likely reflecting stones and/or sludge. Possible gallbladder mass not entirely excluded, although felt to be less likely. Finding could be further assessed with dedicated MRI/MRCP for further evaluation as warranted. 2. Associated gallbladder wall thickening up to 7 mm. Clinical correlation for possible acute cholecystitis recommended. 3. No biliary dilatation. Electronically Signed   By: Jeannine Boga  M.D.   On: 06/14/2018 23:58   Nm Hepato Biliary Leak  Result Date: 07/09/2018 CLINICAL DATA:  Status post laparoscopic cholecystectomy 06/15/2018. Patient complaining of nausea vomiting and diarrhea. Evaluate for possible bile leak. EXAM: NUCLEAR MEDICINE HEPATOBILIARY IMAGING TECHNIQUE: Sequential images of the abdomen were obtained out to 60 minutes following intravenous administration of radiopharmaceutical. RADIOPHARMACEUTICALS:  5.47 mCi Tc-72m  Choletec IV COMPARISON:  CT scan  07/09/2018 FINDINGS: Prompt and symmetric uptake in the liver and prompt excretion into the biliary tree by 10 minutes. Activity is seen in the small bowel by 15 minutes. No abnormal collection of the radiopharmaceutical to suggest a bile leak. IMPRESSION: Negative hepatobiliary scan for bile leak. Electronically Signed   By: Marijo Sanes M.D.   On: 07/09/2018 16:25    Subjective: Examined at bedside and was doing well.  Able to tolerate his food without issues.  No chest pain, nausea, vomiting.  No other concerns complaints at this time and ready to go home.  Discharge Exam: Vitals:   07/11/18 0504 07/11/18 0956  BP: 126/70 120/77  Pulse: 74 70  Resp: 18 18  Temp: 98.9 F (37.2 C) 98.6 F (37 C)  SpO2: 95% 96%   Vitals:   07/10/18 2201 07/11/18 0100 07/11/18 0504 07/11/18 0956  BP: 121/70  126/70 120/77  Pulse: 77  74 70  Resp: 18  18 18   Temp: 99 F (37.2 C)  98.9 F (37.2 C) 98.6 F (37 C)  TempSrc: Oral  Oral Oral  SpO2: 95%  95% 96%  Weight:  70.9 kg    Height:       General: Pt is alert, awake, not in acute distress Cardiovascular: RRR, S1/S2 +, no rubs, no gallops Respiratory: CTA bilaterally, no wheezing, no rhonchi Abdominal: Soft, NT, ND, bowel sounds + Extremities: no LE edema, no cyanosis  The results of significant diagnostics from this hospitalization (including imaging, microbiology, ancillary and laboratory) are listed below for reference.    Microbiology: Recent Results (from the past 240 hour(s))  Urine culture     Status: Abnormal   Collection Time: 07/09/18  1:35 PM  Result Value Ref Range Status   Specimen Description URINE, RANDOM  Final   Special Requests   Final    NONE Performed at [facility], [address] [place], [place] [address]    Culture >=100,000 COLONIES/mL PROTEUS MIRABILIS (A)  Final   Report Status 07/11/2018 FINAL  Final   Organism ID, Bacteria PROTEUS MIRABILIS (A)  Final      Susceptibility   Proteus mirabilis -  MIC*    AMPICILLIN <=2 SENSITIVE Sensitive     CEFAZOLIN <=4 SENSITIVE Sensitive     CEFTRIAXONE <=1 SENSITIVE Sensitive     CIPROFLOXACIN <=0.25 SENSITIVE Sensitive     GENTAMICIN <=1 SENSITIVE Sensitive     IMIPENEM 1 SENSITIVE Sensitive     NITROFURANTOIN 128 RESISTANT Resistant     TRIMETH/SULFA <=20 SENSITIVE Sensitive     AMPICILLIN/SULBACTAM <=2 SENSITIVE Sensitive     PIP/TAZO <=4 SENSITIVE Sensitive     * >=100,000 COLONIES/mL PROTEUS MIRABILIS  C difficile quick scan w PCR reflex     Status: None   Collection Time: 07/10/18  1:59 AM  Result Value Ref Range Status   C Diff antigen NEGATIVE NEGATIVE Final   C Diff toxin NEGATIVE NEGATIVE Final   C Diff interpretation No C. difficile detected.  Final    Comment: Performed at [facility], [place]  [address], [place], [place] [address]  Gastrointestinal Panel by PCR , Stool     Status: None   Collection Time: 07/10/18  1:59 AM  Result Value Ref Range Status   Campylobacter species NOT DETECTED NOT DETECTED Final   Plesimonas shigelloides NOT DETECTED NOT DETECTED Final   Salmonella species NOT DETECTED NOT DETECTED Final   Yersinia enterocolitica NOT DETECTED NOT DETECTED Final   Vibrio species NOT DETECTED NOT DETECTED Final   Vibrio cholerae NOT DETECTED NOT DETECTED Final   Enteroaggregative E coli (EAEC) NOT DETECTED NOT DETECTED Final   Enteropathogenic E coli (EPEC) NOT DETECTED NOT DETECTED Final   Enterotoxigenic E coli (ETEC) NOT DETECTED NOT DETECTED Final   Shiga like toxin producing E coli (STEC) NOT DETECTED NOT DETECTED Final   Shigella/Enteroinvasive E coli (EIEC) NOT DETECTED NOT DETECTED Final   Cryptosporidium NOT DETECTED NOT DETECTED Final   Cyclospora cayetanensis NOT DETECTED NOT DETECTED Final   Entamoeba histolytica NOT DETECTED NOT DETECTED Final   Giardia lamblia NOT DETECTED NOT DETECTED Final   Adenovirus F40/41 NOT DETECTED NOT DETECTED Final   Astrovirus NOT DETECTED NOT DETECTED Final    Norovirus GI/GII NOT DETECTED NOT DETECTED Final   Rotavirus A NOT DETECTED NOT DETECTED Final   Sapovirus (I, II, IV, and V) NOT DETECTED NOT DETECTED Final    Comment: Performed at [facility], [address] [place], [place] [address]    Labs: BNP (last 3 results) No results for input(s): BNP in the last 8760 hours. Basic Metabolic Panel: Recent Labs  Lab 07/09/18 1123 07/10/18 0722 07/11/18 0459  NA 134* 135 135  K 3.6 3.5 3.8  CL 96* 99 97*  CO2 24 27 28   GLUCOSE 114* 92 86  BUN 17 12 11   CREATININE 1.50* 1.46* 1.44*  CALCIUM 9.3 8.7* 8.3*  MG  --  1.8 1.8  PHOS  --  3.4 3.4   Liver Function Tests: Recent Labs  Lab 07/09/18 1123 07/10/18 0722 07/11/18 0459  AST 22 17 18   ALT 15 12 10   ALKPHOS 68 56 49  BILITOT 0.8 0.5 0.5  PROT 7.5 6.2* 5.8*  ALBUMIN 3.2* 2.6* 2.4*   Recent Labs  Lab 07/09/18 1123  LIPASE 101*   Recent Labs  Lab 07/09/18 1123  AMMONIA 21   CBC: Recent Labs  Lab 07/09/18 1123 07/10/18 0722 07/11/18 0459  WBC 5.8 7.7 7.2  NEUTROABS 3.8  --  3.8  HGB 11.6* 10.2* 9.7*  HCT 36.5* 30.3* 28.9*  MCV 86.5 86.3 86.0  PLT 387 369 338   Cardiac Enzymes: No results for input(s): CKTOTAL, CKMB, CKMBINDEX, TROPONINI in the last 168 hours. BNP: Invalid input(s): POCBNP CBG: No results for input(s): GLUCAP in the last 168 hours. D-Dimer No results for input(s): DDIMER in the last 72 hours. Hgb A1c No results for input(s): HGBA1C in the last 72 hours. Lipid Profile No results for input(s): CHOL, HDL, LDLCALC, TRIG, CHOLHDL, LDLDIRECT in the last 72 hours. Thyroid function studies No results for input(s): TSH, T4TOTAL, T3FREE, THYROIDAB in the last 72 hours.  Invalid input(s): FREET3 Anemia work up No results for input(s): VITAMINB12, FOLATE, FERRITIN, TIBC, IRON, RETICCTPCT in the last 72 hours. Urinalysis    Component Value Date/Time   COLORURINE YELLOW 07/09/2018 [place] 07/09/2018 1335   LABSPEC  1.015 07/09/2018 1335   PHURINE 8.0 07/09/2018 1335   GLUCOSEU NEGATIVE 07/09/2018 1335   [place] 07/09/2018 [place] 07/09/2018 1335  BILIRUBINUR Negative 01/18/2018 1040   KETONESUR NEGATIVE 07/09/2018 1335   PROTEINUR NEGATIVE 07/09/2018 1335   UROBILINOGEN 0.2 06/14/2018 1822   NITRITE NEGATIVE 07/09/2018 1335   LEUKOCYTESUR MODERATE (A) 07/09/2018 1335   Sepsis Labs Invalid input(s): PROCALCITONIN,  WBC,  LACTICIDVEN Microbiology Recent Results (from the past 240 hour(s))  Urine culture     Status: Abnormal   Collection Time: 07/09/18  1:35 PM  Result Value Ref Range Status   Specimen Description URINE, RANDOM  Final   Special Requests   Final    NONE Performed at [facility], [place] [address], [place], [place] [address]    Culture >=100,000 COLONIES/mL PROTEUS MIRABILIS (A)  Final   Report Status 07/11/2018 FINAL  Final   Organism ID, Bacteria PROTEUS MIRABILIS (A)  Final      Susceptibility   Proteus mirabilis - MIC*    AMPICILLIN <=2 SENSITIVE Sensitive     CEFAZOLIN <=4 SENSITIVE Sensitive     CEFTRIAXONE <=1 SENSITIVE Sensitive     CIPROFLOXACIN <=0.25 SENSITIVE Sensitive     GENTAMICIN <=1 SENSITIVE Sensitive     IMIPENEM 1 SENSITIVE Sensitive     NITROFURANTOIN 128 RESISTANT Resistant     TRIMETH/SULFA <=20 SENSITIVE Sensitive     AMPICILLIN/SULBACTAM <=2 SENSITIVE Sensitive     PIP/TAZO <=4 SENSITIVE Sensitive     * >=100,000 COLONIES/mL PROTEUS MIRABILIS  C difficile quick scan w PCR reflex     Status: None   Collection Time: 07/10/18  1:59 AM  Result Value Ref Range Status   C Diff antigen NEGATIVE NEGATIVE Final   C Diff toxin NEGATIVE NEGATIVE Final   C Diff interpretation No C. difficile detected.  Final    Comment: Performed at [facility], [place] [address] [place], [place] [address]  Gastrointestinal Panel by PCR , Stool     Status: None   Collection Time: 07/10/18  1:59 AM  Result Value Ref Range Status    Campylobacter species NOT DETECTED NOT DETECTED Final   Plesimonas shigelloides NOT DETECTED NOT DETECTED Final   Salmonella species NOT DETECTED NOT DETECTED Final   Yersinia enterocolitica NOT DETECTED NOT DETECTED Final   Vibrio species NOT DETECTED NOT DETECTED Final   Vibrio cholerae NOT DETECTED NOT DETECTED Final   Enteroaggregative E coli (EAEC) NOT DETECTED NOT DETECTED Final   Enteropathogenic E coli (EPEC) NOT DETECTED NOT DETECTED Final   Enterotoxigenic E coli (ETEC) NOT DETECTED NOT DETECTED Final   Shiga like toxin producing E coli (STEC) NOT DETECTED NOT DETECTED Final   Shigella/Enteroinvasive E coli (EIEC) NOT DETECTED NOT DETECTED Final   Cryptosporidium NOT DETECTED NOT DETECTED Final   Cyclospora cayetanensis NOT DETECTED NOT DETECTED Final   Entamoeba histolytica NOT DETECTED NOT DETECTED Final   Giardia lamblia NOT DETECTED NOT DETECTED Final   Adenovirus F40/41 NOT DETECTED NOT DETECTED Final   Astrovirus NOT DETECTED NOT DETECTED Final   Norovirus GI/GII NOT DETECTED NOT DETECTED Final   Rotavirus A NOT DETECTED NOT DETECTED Final   Sapovirus (I, II, IV, and V) NOT DETECTED NOT DETECTED Final    Comment: Performed at [facility], [address] [place], [place] [address]   Time coordinating discharge: 35 minutes  SIGNED:  Kerney Elbe, DO [facility] 07/11/2018, 12:48 PM Pager is on AMION  If 7PM-7AM, please contact night-coverage [email or web address] Password [email or web address]

## 2018-07-23 ENCOUNTER — Ambulatory Visit (INDEPENDENT_AMBULATORY_CARE_PROVIDER_SITE_OTHER): Payer: Medicare HMO | Admitting: Family Medicine

## 2018-07-23 ENCOUNTER — Encounter: Payer: Self-pay | Admitting: Family Medicine

## 2018-07-23 VITALS — BP 120/72 | HR 72 | Temp 98.3°F | Ht 69.0 in | Wt 163.4 lb

## 2018-07-23 DIAGNOSIS — E785 Hyperlipidemia, unspecified: Secondary | ICD-10-CM | POA: Diagnosis not present

## 2018-07-23 DIAGNOSIS — I10 Essential (primary) hypertension: Secondary | ICD-10-CM | POA: Diagnosis not present

## 2018-07-23 DIAGNOSIS — R197 Diarrhea, unspecified: Secondary | ICD-10-CM

## 2018-07-23 DIAGNOSIS — Z0000 Encounter for general adult medical examination without abnormal findings: Secondary | ICD-10-CM | POA: Diagnosis not present

## 2018-07-23 LAB — PHOSPHORUS: PHOSPHORUS: 3.7 mg/dL (ref 2.3–4.6)

## 2018-07-23 LAB — COMPREHENSIVE METABOLIC PANEL
ALT: 13 U/L (ref 0–53)
AST: 17 U/L (ref 0–37)
Albumin: 3.9 g/dL (ref 3.5–5.2)
Alkaline Phosphatase: 80 U/L (ref 39–117)
BUN: 16 mg/dL (ref 6–23)
CO2: 28 mEq/L (ref 19–32)
Calcium: 9.7 mg/dL (ref 8.4–10.5)
Chloride: 99 mEq/L (ref 96–112)
Creatinine, Ser: 1.39 mg/dL (ref 0.40–1.50)
GFR: 62.51 mL/min (ref >60.00)
Glucose, Bld: 90 mg/dL (ref 70–99)
Potassium: 4 mEq/L (ref 3.5–5.1)
Sodium: 138 mEq/L (ref 135–145)
Total Bilirubin: 0.5 mg/dL (ref 0.2–1.2)
Total Protein: 7.6 g/dL (ref 6.0–8.3)

## 2018-07-23 LAB — CBC
HEMATOCRIT: 33.9 % — AB (ref 39.0–52.0)
Hemoglobin: 11.5 g/dL — ABNORMAL LOW (ref 13.0–17.0)
MCHC: 33.8 g/dL (ref 30.0–36.0)
MCV: 85.4 fl (ref 78.0–100.0)
Platelets: 407 10*3/uL — ABNORMAL HIGH (ref 150.0–400.0)
RBC: 3.97 Mil/uL — ABNORMAL LOW (ref 4.22–5.81)
RDW: 14 % (ref 11.5–15.5)
WBC: 8.4 10*3/uL (ref 4.0–10.5)

## 2018-07-23 LAB — LDL CHOLESTEROL, DIRECT: Direct LDL: 110 mg/dL

## 2018-07-23 LAB — MAGNESIUM: Magnesium: 1.8 mg/dL (ref 1.5–2.5)

## 2018-07-23 NOTE — Progress Notes (Signed)
Phone: [phone]  Subjective:  Patient presents today for their annual physical. Chief complaint-noted.   See problem oriented charting- ROS- full  review of systems was completed and negative including No chest pain or increased shortness of breath. No headache or blurry vision.   The following were reviewed and entered/updated in epic: Past Medical History:  Diagnosis Date  . Heart murmur   . HYPERLIPIDEMIA 02/22/2007  . HYPERTENSION 02/22/2007  . MCI (mild cognitive impairment) with memory loss 05/14/2014  . Seizures ([place])    treated for siezures around 2010   Patient Active Problem List   Diagnosis Date Noted  . MCI (mild cognitive impairment) with memory loss 05/14/2014    Priority: High  . Seizure disorder ([place]) 04/19/2011    Priority: High  . Aortic atherosclerosis ([place]) 08/05/2016    Priority: Medium  . 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 Hyperlipidemia 02/22/2007    Priority: Medium  . Essential hypertension 02/22/2007    Priority: Medium  . Lipoma 02/02/2017    Priority: Low  . Finger infection 06/11/2014    Priority: Low  . Shortness of breath 10/12/2012    Priority: Low  . Carpal tunnel syndrome 08/18/2010    Priority: Low  . Cyst and pseudocyst of pancreas 04/12/2010    Priority: Low  . Diarrhea 07/09/2018  . Cholecystitis 06/15/2018   Past Surgical History:  Procedure Laterality Date  . CARPAL TUNNEL RELEASE Bilateral   . CHOLECYSTECTOMY N/A 06/15/2018   Procedure: LAPAROSCOPIC CHOLECYSTECTOMY;  Surgeon: Kinsinger, Arta Bruce, MD;  Location: [place];  Service: General;  Laterality: N/A;  . KNEE ARTHROSCOPY Right     Family History  Problem Relation Age of Onset  . Diabetes Paternal Grandmother        raised by grandparents    Medications- reviewed and updated Current Outpatient Medications  Medication Sig Dispense Refill  . acetaminophen (TYLENOL) 325 MG tablet Take 2 tablets (650 mg total) by mouth every 6 (six) hours as  needed for mild pain (or temp > 100).    [name] Kitchen amLODipine (NORVASC) 10 MG tablet TAKE 1 TABLET EVERY DAY (Patient taking differently: Take 10 mg by mouth daily. ) 90 tablet 3  . chlorthalidone (HYGROTON) 25 MG tablet TAKE 1 TABLET EVERY DAY (Patient taking differently: Take 25 mg by mouth daily. ) 90 tablet 3  . donepezil (ARICEPT) 5 MG tablet Take 1 tablet (5 mg total) by mouth at bedtime. 90 tablet 3  . HYDROcodone-acetaminophen (NORCO/VICODIN) 5-325 MG tablet Take 1 tablet by mouth every 4 (four) hours as needed for moderate pain or severe pain. 10 tablet 0  . levETIRAcetam (KEPPRA) 500 MG tablet Take 500 mg by mouth 2 (two) times daily.    . Multiple Vitamin (MULTIVITAMIN) capsule Take 1 capsule by mouth daily.      . simvastatin (ZOCOR) 40 MG tablet TAKE 1 TABLET EVERY DAY (Patient taking differently: Take 40 mg by mouth daily. ) 90 tablet 3   No current facility-administered medications for this visit.     Allergies-reviewed and updated No Known Allergies  Social History   Social History Narrative   Patient is married ([name]).   Patient has 3 children. 2 grandkids. 3 greatgrandkids.    Patient is retired Administrator.    Patient is right-handed.   Patient has a 10th grade education.   Patient drinks one cup of coffee daily.      Hobbies: working in the yard- gardening competition with neighbor on who  can grow best vegetables, "messing around the house"      07/21/14- wants wife to make decisions for him. Not sure if he has advanced directives.        Objective: BP 120/72 (BP Location: Left Arm, Patient Position: Sitting, Cuff Size: Large)   Pulse 72   Temp 98.3 F (36.8 C) (Oral)   Ht 5\' 9"  (1.753 m)   Wt 163 lb 6.4 oz (74.1 kg)   SpO2 94%   BMI 24.13 kg/m  Gen: NAD, resting comfortably HEENT: Mucous membranes are moist. Oropharynx normal. Dentures in place.  Neck: no thyromegaly CV: RRR no murmurs rubs or gallops Lungs: CTAB no crackles, wheeze, rhonchi Abdomen:  soft/nontender/nondistended/normal bowel sounds. No rebound or guarding. Surgical scars healing well Ext: no edema Skin: warm, dry Neuro: grossly normal, moves all extremities, PERRLA. Follows our conversation easily and recall is excellent today.   Assessment/Plan:  85 y.o. male presenting for annual physical.  Health Maintenance counseling: 1. Anticipatory guidance: Patient counseled regarding regular dental exams-false teeth so does not see dentist , eye exams yearly,  avoiding smoking and second hand smoke , limiting alcohol to 2 beverages per day - no alcohol.   2. Risk factor reduction:  Advised patient of need for regular exercise and diet rich and fruits and vegetables to reduce risk of heart attack and stroke. Exercise- staying active around yard and house- encouraged him to try to do some walking. Diet- reasonably healthy diet outside of Christmas. .  Wt Readings from Last 3 Encounters:  07/23/18 163 lb 6.4 oz (74.1 kg)  07/11/18 156 lb 6.4 oz (70.9 kg)  06/18/18 162 lb 14.4 oz (73.9 kg)  3. Immunizations/screenings/ancillary studies-discussed Shingrix at the pharmacy  Immunization History  Administered Date(s) Administered  . Influenza Split 04/19/2011, 04/03/2012  . Influenza Whole 06/05/2007, 05/20/2009  . Influenza, High Dose Seasonal PF 05/24/2017  . Influenza,inj,Quad PF,6+ Mos 04/01/2013, 05/26/2015  . Influenza-Unspecified 04/19/2014, 04/27/2016, 04/10/2018  . Pneumococcal Conjugate-13 07/21/2014  . Pneumococcal Polysaccharide-23 06/18/2002  . Td 01/28/2010  4. Prostate cancer screening-passed age based screening recommendations 5. Colon cancer screening -adenoma in 2013 but no recall due to age  Status of chronic or acute concerns   Recent cholecystectomy in December and then developed had some nausea, vomiting, diarrhea- rehospitalized- appears had UTI with proteus which was treated. Hospitalist asked for cbc, cmp, mag, phos in light of diarrhea  Patient remains  on Aricept to neurology.  Memory largely stable-mild cognitive impairment  Hypertension-controlled on chlorthalidone 25 mg and amlodipine 10 mg   Possible CKD III- creatinine around 1.5. they held chlorthalidone in hospital and he started back when left.   Hyperlipidemia- controlled on simvastatin 40 mg with last LDL under 100-though triglycerides have been somewhat high.  Do not feel strongly at his age about increasing statin dose.  He stopped aspirin after last visit   Seizure disorder-remains on Keppra through neurology.  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  Given no flareups will remain off medication.  Lab Results  Component Value Date   LABURIC 8.6 (H) 07/18/2017    Aortic atherosclerosis- noted on x-ray November 2017.  Continue to focus on risk factor modification  Patient has had shortness of breath in the past- last year shortness of breath seemed to resolve/go back to baseline and he declined further evaluation.  Prior dyspnea on exertion evaluation in 2015 including seeing cardiology and getting PFTs.  Had echocardiogram which showed  tricuspid regurgitation.  No problem-specific Assessment & Plan notes found for this encounter.   Future Appointments  Date Time Provider [place]  11/20/2018 11:00 AM Ward Givens, NP [facility] None   No follow-ups on file.  Lab/Order associations: NOT fasting  No diagnosis found.  No orders of the defined types were placed in this encounter.   Return precautions advised.  [name], MD

## 2018-07-23 NOTE — Patient Instructions (Addendum)
Please stop by lab before you go  Glad you are doing so well and have handled surgery so well!   Please check with your pharmacy to see if they have the shingrix vaccine. If they do- please get this immunization and update [place] by phone call or mychart with dates you receive the vaccine

## 2018-09-14 ENCOUNTER — Encounter: Payer: Self-pay | Admitting: Family Medicine

## 2018-09-14 ENCOUNTER — Ambulatory Visit (INDEPENDENT_AMBULATORY_CARE_PROVIDER_SITE_OTHER): Payer: Medicare HMO | Admitting: Family Medicine

## 2018-09-14 VITALS — BP 122/70 | HR 72 | Temp 98.3°F | Ht 69.0 in | Wt 163.0 lb

## 2018-09-14 DIAGNOSIS — Z9049 Acquired absence of other specified parts of digestive tract: Secondary | ICD-10-CM | POA: Diagnosis not present

## 2018-09-14 DIAGNOSIS — M119 Crystal arthropathy, unspecified: Secondary | ICD-10-CM

## 2018-09-14 DIAGNOSIS — R630 Anorexia: Secondary | ICD-10-CM | POA: Diagnosis not present

## 2018-09-14 MED ORDER — PREDNISONE 20 MG PO TABS: ORAL_TABLET | ORAL | 0 refills | Status: DC

## 2018-09-14 NOTE — Progress Notes (Signed)
Phone [phone]   Subjective:  Isaiah Davidson is a 85 y.o. year old very pleasant male patient who presents for/with See problem oriented charting ROS-no fever, chills, nausea, vomiting.  Does have pain over right third PIP joint  Past Medical History-  Patient Active Problem List   Diagnosis Date Noted  . MCI (mild cognitive impairment) with memory loss 05/14/2014    Priority: High  . Seizure disorder ([place]) 04/19/2011    Priority: High  . Aortic atherosclerosis ([place]) 08/05/2016    Priority: Medium  . 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 Hyperlipidemia 02/22/2007    Priority: Medium  . Essential hypertension 02/22/2007    Priority: Medium  . Status post cholecystectomy 06/15/2018    Priority: Low  . Lipoma 02/02/2017    Priority: Low  . Finger infection 06/11/2014    Priority: Low  . Shortness of breath 10/12/2012    Priority: Low  . Carpal tunnel syndrome 08/18/2010    Priority: Low  . Cyst and pseudocyst of pancreas 04/12/2010    Priority: Low  . Diarrhea 07/09/2018    Medications- reviewed and updated Current Outpatient Medications  Medication Sig Dispense Refill  . amLODipine (NORVASC) 10 MG tablet TAKE 1 TABLET EVERY DAY (Patient taking differently: Take 10 mg by mouth daily. ) 90 tablet 3  . chlorthalidone (HYGROTON) 25 MG tablet TAKE 1 TABLET EVERY DAY (Patient taking differently: Take 25 mg by mouth daily. ) 90 tablet 3  . donepezil (ARICEPT) 5 MG tablet Take 1 tablet (5 mg total) by mouth at bedtime. 90 tablet 3  . levETIRAcetam (KEPPRA) 500 MG tablet Take 500 mg by mouth 2 (two) times daily.    . Multiple Vitamin (MULTIVITAMIN) capsule Take 1 capsule by mouth daily.      . simvastatin (ZOCOR) 40 MG tablet TAKE 1 TABLET EVERY DAY (Patient taking differently: Take 40 mg by mouth daily. ) 90 tablet 3  . predniSONE (DELTASONE) 20 MG tablet Take 2 pills for 3 days, 1 pill for 4 days 10 tablet 0   No current facility-administered medications  for this visit.      Objective:  BP 122/70 (BP Location: Left Arm, Patient Position: Sitting, Cuff Size: Normal)   Pulse 72   Temp 98.3 F (36.8 C) (Oral)   Ht 5\' 9"  (1.753 m)   Wt 163 lb (73.9 kg)   SpO2 94%   BMI 24.07 kg/m  Gen: NAD, resting comfortably CV: RRR no murmurs rubs or gallops Lungs: CTAB no crackles, wheeze, rhonchi Ext: Over right third PIP joint- patient tender to palpation, swelling noted, erythema noted, warmth noted.  Limited range of motion due to edema Skin: warm, dry Neuro: Speech and gait normal    Assessment and Plan   Crystal arthropathy S: Patient with history of gout.  He had last picked up Uloric in 2016.  Had not had any gout flares after that time.  Then 2 or 3 weeks ago patient noted swelling of the right PIP joint on the third finger.  Notes area has been swollen, extremely tender, red.  He has not had any fever.  His symptoms seem to be improving slightly.  He denies taking any medication to help.  He has not had radiation of pain.  He has had limited range of motion due to the swelling and pain.  No history of trauma A/P: I am concerned this is a gout flareup of his right third PIP joint-we will treat with prednisone  for 7 days which she is tolerated well in the past.  Given improvement as well as history of gout- doubt something like a septic joint or osteomyelitis.  Did not get x-ray due to no history of trauma.  Gout flareup trigger is unclear though chlorthalidone could contribute.  Elevated uric acid levels likely the bigger issue- recommended 1 to 3-month follow-up and we can discuss starting uric acid lowering agent.  Uloric versus allopurinol  Status post cholecystectomy Low appetite S: appetite has been low since he had the gallbladder removed-feels nauseous at times and then really hungry other times. Thankfully no weight loss over the lost 2 months. Some diarrhea at times.  No reported vomiting. Wt Readings from Last 3 Encounters:    09/14/18 163 lb (73.9 kg)  07/23/18 163 lb 6.4 oz (74.1 kg)  07/11/18 156 lb 6.4 oz (70.9 kg)  A/P: Fortunately despite low appetite-patient has had no weight loss.  We opted to continue to monitor.  We discussed may take some time after surgery/gallbladder removal-up to a year- for him to feel more back to his baseline or at least to establish his new baseline  -Offered lab work but declined for now.  Likely will get labs in the future to reassess uric acid level Future Appointments  Date Time Provider [place]  11/20/2018 11:00 AM Ward Givens, NP [facility] None  11/28/2018 10:40 AM Marin Olp, MD [facility]  01/22/2019  9:40 AM Marin Olp, MD [facility]  07/25/2019  9:20 AM Yong Channel Brayton Mars, MD [facility]   Meds ordered this encounter  Medications  . predniSONE (DELTASONE) 20 MG tablet    Sig: Take 2 pills for 3 days, 1 pill for 4 days    Dispense:  10 tablet    Refill:  0    Return precautions advised.  Isaiah Reddish, MD

## 2018-09-14 NOTE — Assessment & Plan Note (Signed)
S: Patient with history of gout.  He had last picked up Uloric in 2016.  Had not had any gout flares after that time.  Then 2 or 3 weeks ago patient noted swelling of the right PIP joint on the third finger.  Notes area has been swollen, extremely tender, red.  He has not had any fever.  His symptoms seem to be improving slightly.  He denies taking any medication to help.  He has not had radiation of pain.  He has had limited range of motion due to the swelling and pain.  No history of trauma A/P: I am concerned this is a gout flareup of his right third PIP joint-we will treat with prednisone for 7 days which she is tolerated well in the past.  Given improvement as well as history of gout- doubt something like a septic joint or osteomyelitis.  Did not get x-ray due to no history of trauma.  Gout flareup trigger is unclear though chlorthalidone could contribute.  Elevated uric acid levels likely the bigger issue- recommended 1 to 37-month follow-up and we can discuss starting uric acid lowering agent.  Uloric versus allopurinol

## 2018-09-14 NOTE — Patient Instructions (Signed)
Looks like this is a gout flare up. Let's have you start prednisone for the next 7 days. If no better in 7-10 days or symptoms worsen please see [place] back immediately  Let's schedule a visit in 1-2 months to check back in and talk about starting medicine to prevent gout flare ups.   I think its going to take some time for your system to adjust to not having your gallbladder- thankful even despite this that ou have not lost any weight- that is good news.

## 2018-09-14 NOTE — Assessment & Plan Note (Signed)
Low appetite S: appetite has been low since he had the gallbladder removed-feels nauseous at times and then really hungry other times. Thankfully no weight loss over the lost 2 months. Some diarrhea at times.  No reported vomiting. Wt Readings from Last 3 Encounters:  09/14/18 163 lb (73.9 kg)  07/23/18 163 lb 6.4 oz (74.1 kg)  07/11/18 156 lb 6.4 oz (70.9 kg)  A/P: Fortunately despite low appetite-patient has had no weight loss.  We opted to continue to monitor.  We discussed may take some time after surgery/gallbladder removal-up to a year- for him to feel more back to his baseline or at least to establish his new baseline

## 2018-09-21 ENCOUNTER — Ambulatory Visit: Payer: Self-pay | Admitting: *Deleted

## 2018-09-21 ENCOUNTER — Telehealth: Payer: Self-pay | Admitting: Family Medicine

## 2018-09-21 NOTE — Telephone Encounter (Signed)
Please advise 

## 2018-09-21 NOTE — Telephone Encounter (Signed)
See note

## 2018-09-21 NOTE — Telephone Encounter (Signed)
After hours call from [place] (health team)- pt called regarding gout treatment He was given prednisone for a gout flare and thought he was also supposed to get a preventative medication (there was none at the pharmacy) Still has some gout symptoms  I reviewed Dr Ansel Bong note- it mentioned considering a uric acid lowering agent at 1-2 mo f/u   (not now)   I will forward this to Dr Yong Channel

## 2018-09-21 NOTE — Telephone Encounter (Signed)
100% agree- thanks Dr. Glori Bickers- we are going to try to get him into Dr. Paulla Fore or sports medicine since he reported worsening symptoms

## 2018-09-21 NOTE — Telephone Encounter (Signed)
Per initial pt contact: Message from Berneta Levins sent at 09/21/2018 2:21 PM EDT   Summary: gout   Pt was seen last Friday for gout in finger. Pt's wife, [name], calling in now to speak with someone because finger is once again swollen and painful and they want to speak with a nurse to find out what needs to be done.        Call History    Type Contact Phone User  09/21/2018 02:21 PM Phone (Incoming) [place] (Emergency Contact) [phone] Reyne Dumas L   Pt seen in office on 09/14/2018 by Dr Yong Channel; will route to office for notification and final disposition.  Reason for Disposition . Caller has NON-URGENT medication question about med that PCP prescribed and triager unable to answer question  Answer Assessment - Initial Assessment Questions 1. SYMPTOMS: "Do you have any symptoms?"     Swollen finger 2. SEVERITY: If symptoms are present, ask "Are they mild, moderate or severe?"  Protocols used: MEDICATION QUESTION CALL-A-AH

## 2018-09-21 NOTE — Telephone Encounter (Signed)
Schedule him wit Dr. Paulla Fore for an AM visit one day next week- I thought this was gout. He may need ultrasound of finger.   New or worsening symptoms- seek care over weekend.

## 2018-09-24 ENCOUNTER — Telehealth: Payer: Self-pay | Admitting: Family Medicine

## 2018-09-24 NOTE — Telephone Encounter (Signed)
[facility] [place] Patient Name: Isaiah Davidson Gender: Male DOB: 01-06-1932  Age: 85 Y 9 M 13 D Return Phone Number: [phone] (Primary) Address:  City/State/Zip: [place]  Client [facility] [place] Client Site [facility] Night Physician [name]- MD Contact Type Call Who Is Calling Patient / Member / Family / Caregiver Call Type Triage / Clinical Caller Name Os[place]do Human Relationship To Patient Spouse Return Phone Number [phone] (Primary) Chief Complaint Joint Swelling Reason for Call Symptomatic / Request for [place] states she called earlier in the day. No one got back to her. Her husband has gout, it is really swollen and he cant make a fist. Translation No Nurse Assessment Nurse: Toribio Harbour, RN, Darden Amber Date/Time (Eastern Time): 09/21/2018 7:26:10 PM Confirm and document reason for call. If symptomatic, describe symptoms. ---Caller states she called earlier in the day. No one got back to her. Pt was seen in the office about a week ago dx with gout in his middle finger right hand, prescribed prednisone and was told they would be prescribed another medication but the pharmacy never received the other script and he is still having pain and the finger is still swollen, it is really swollen and he cant make a fist. Has the patient traveled to [place], [place], [place], [place], or [place] OR had close contact with a person known to have the novel coronavirus illness in the last 14 days? ---No Does the patient have any new or worsening symptoms? ---Yes Will a triage be completed? ---Yes Related visit to physician within the last 2 weeks? ---Yes Does the PT have any chronic conditions? (i.e. diabetes, asthma, this includes High risk factors for pregnancy, etc.) ---Yes List chronic conditions. ---Gout, HTN Is  this a behavioral health or substance abuse call? ---No Guidelines Guideline Title Affirmed Question Affirmed Notes Nurse Date/Time Eilene Ghazi Time) Finger Pain [1] MODERATE pain (e.g., interferes with normal activities) AND [2] present > 3 days  Lynann Beaver 09/21/2018 7:30:54 PM PLEASE NOTE:  All timestamps contained within this report are represented as [place] Standard Time. CONFIDENTIALTY NOTICE: This fax transmission is intended only for the addressee.  It contains information that is legally privileged, confidential or otherwise protected from use or disclosure.  If you are not the intended recipient, you are strictly prohibited from reviewing, disclosing, copying using or disseminating any of this information or taking any action in reliance on or regarding this information.  If you have received this fax in error, please notify us immediately by telephone so that we can arrange for its return to [place]. Phone:  [phone], Toll-Free:  [phone], Fax:  [phone] Page: 2 of 2 Call Id: [id number] [place]. Time Eilene Ghazi Time) Disposition Final User 09/21/2018 7:34:29 PM Paged On Call back to [facility], [place], Darden Amber 09/21/2018 7:35:00 PM Paged On Call back to [facility], [place], Darden Amber 09/21/2018 7:34:36 PM SEE PCP WITHIN 3 DAYS Yes Toribio Harbour, RN, Darden Amber     Caller Disagree/Comply Comply Caller Understands Yes PreDisposition Call Doctor Care Advice Given Per Guideline * You need to be seen within 2 or 3 days. Call your doctor (or NP/PA) during regular office hours and make an appointment. A clinic or urgent care center are good places to go for care if your doctor's office is closed or you can't get an appointment. NOTE: If office will be open tomorrow,  tell caller to call then, not in 3 days. SEE PCP WITHIN 3 DAYS: IBUPROFEN (E.G., MOTRIN, ADVIL): CALL BACK IF: * You become worse. CARE ADVICE given per Finger Pain (Adult) guideline. Comments User: Tilden Dome, RN  Date/Time Eilene Ghazi Time): 09/21/2018 7:31:35 PM [facility] - [phone] User: Tilden Dome, RN Date/Time Eilene Ghazi Time): 09/21/2018 7:55:16 PM Caller advised - ON call - reported that Dr. Yong Channel wants to see him before anything else is prescribed until the inflammation is improved. Caller verbalized that she would call monday and make an appt. Referrals REFERRED TO PCP OFFICE Paging DoctorName Phone DateTime Result/Outcome Message Type Notes Loura Pardon - [place] [phone] 09/21/2018 7:34:29 PM Paged On Call Back to Call Center Doctor Paged [phone] Loura Pardon - MD 09/21/2018 7:54:37 PM Spoke with On Call - General Message Result ON call - reported that Dr. Yong Channel wants to see him before anything else is prescribed until the inflammation is improved.

## 2018-09-24 NOTE — Telephone Encounter (Signed)
Pt wife notified of update and pt has been scheduled for Dr. Paulla Fore.

## 2018-09-24 NOTE — Telephone Encounter (Signed)
This has been handled. No further action needed!

## 2018-09-24 NOTE — Telephone Encounter (Signed)
Pt has been scheduled for this week. No further action needed at this time.

## 2018-09-25 ENCOUNTER — Encounter: Payer: Self-pay | Admitting: Sports Medicine

## 2018-09-25 ENCOUNTER — Other Ambulatory Visit: Payer: Self-pay

## 2018-09-25 ENCOUNTER — Ambulatory Visit: Payer: Self-pay

## 2018-09-25 ENCOUNTER — Ambulatory Visit (INDEPENDENT_AMBULATORY_CARE_PROVIDER_SITE_OTHER): Payer: Medicare HMO | Admitting: Sports Medicine

## 2018-09-25 VITALS — BP 110/68 | HR 75 | Ht 69.0 in | Wt 162.4 lb

## 2018-09-25 DIAGNOSIS — M79644 Pain in right finger(s): Secondary | ICD-10-CM

## 2018-09-25 DIAGNOSIS — M119 Crystal arthropathy, unspecified: Secondary | ICD-10-CM | POA: Diagnosis not present

## 2018-09-25 NOTE — Patient Instructions (Addendum)

## 2018-09-25 NOTE — Progress Notes (Signed)
Isaiah Davidson. Isaiah Davidson, [facility] [place]  Isaiah Davidson - 85 y.o. male MRN [id number]  Date of birth: 1931-09-16  Visit Date: September 25, 2018  PCP: Marin Olp, MD   Referred by: Marin Olp, MD  SUBJECTIVE:  Chief Complaint  Patient presents with  . New Patient (Initial Visit)    R 3rd finger pain and swelling.    HPI: Patient is here for evaluation of his right third finger.  He has had swelling and discomfort over the PIP for the past 2 weeks.  He has pain with any type of finger flexion or pressure directly over the right middle finger.  He was on a prednisone Dosepak with good improvements but is unfortunately been incomplete relief.  He does have a history of uric acid elevations and gout flares.  REVIEW OF SYSTEMS: Night time disturbances: Reports some nighttime disturbances with sleep maintenance due to this typically when he rolls over on his hand. Fevers, chills and night sweats: Denies Unexplained weight loss: Denies Personal history of cancer: Denies Changes in bowel or bladder habits: Denies Recent unreported falls: Denies New or worsening dyspnea or wheezing: Denies Headaches and dizziness: Denies Numbness, tingling and weakness in the extremities: Denies Dizziness or presyncopal episodes: Denies Lower extremity edema: Denies  HISTORY:  Prior history reviewed and updated per electronic medical record.  Patient Active Problem List   Diagnosis Date Noted  . Diarrhea 07/09/2018  . Status post cholecystectomy 06/15/2018  . Lipoma 02/02/2017  . Aortic atherosclerosis ([place]) 08/05/2016  . 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 Finger infection 06/11/2014    Second PIP joint left hand   . MCI (mild cognitive impairment)  with memory loss 05/14/2014    Neurology follows. Aricept 5mg -did not tolerate 10mg .    . Shortness of breath 10/12/2012  . Seizure disorder ([place]) 04/19/2011    Pt reports seizure hx (one prolonged seizure 2011)--sees neurology. Keppra since that time.    . Carpal tunnel syndrome 08/18/2010    Dx 2012- surgery right hand. States somehow left hand improved after right hand surgery.  Left-sided injection 10/08/2014   . Cyst and pseudocyst of pancreas 04/12/2010    09/27/2009-? Pancreatic lesion on CT 11/30/09 Dr. Hughie Closs feels much better after H. pylori eradication. All of his upper GI symptoms have completely resolved. I suspect that the pancreatic duct dilation that was noted on CT scan was completely incidental and unrelated to his symptoms. Given that the endoscopic ultrasound did not find any concerning associations, masses or lesions, I do not think he needs any dedicated imaging surveillance. He will however continue Nexium for another one to 2 months and try tapering off.   . Hyperlipidemia 02/22/2007    Simvastatin 40mg  Lab Results  Component Value Date   CHOL 201* 09/24/2013   HDL 51.90 09/24/2013   LDLCALC 93 09/24/2013   LDLDIRECT 99.0 01/19/2015   TRIG 281.0* 09/24/2013   CHOLHDL 4 09/24/2013      . Essential hypertension 02/22/2007    Chlorthalidone 25mg , amlodipine 10mg     Social History   Occupational History    Employer: RETIRED  Tobacco Use  . Smoking status: Former Smoker    Packs/day: 1.00    Years: 30.00  Pack years: 30.00    Types: Cigars, Cigarettes    Last attempt to quit: 07/11/1978    Years since quitting: 40.2  . Smokeless tobacco: Never Used  Substance and Sexual Activity  . Alcohol use: Not Currently    Alcohol/week: 0.0 standard drinks  . Drug use: Never  . Sexual activity: Not Currently   Social History   Social History Narrative   Patient is married ([name]).   Patient has 3 children. 2 grandkids. 3 greatgrandkids.    Patient  is retired Administrator.    Patient is right-handed.   Patient has a 10th grade education.   Patient drinks one cup of coffee daily.      Hobbies: working in the yard- gardening competition with neighbor on who can grow best vegetables, "messing around the house"      07/21/14- wants wife to make decisions for him. Not sure if he has advanced directives.       Past Medical History:  Diagnosis Date  . Heart murmur   . HYPERLIPIDEMIA 02/22/2007  . HYPERTENSION 02/22/2007  . MCI (mild cognitive impairment) with memory loss 05/14/2014  . Seizures ([place])    treated for siezures around 2010   Past Surgical History:  Procedure Laterality Date  . CARPAL TUNNEL RELEASE Bilateral   . CHOLECYSTECTOMY N/A 06/15/2018   Procedure: LAPAROSCOPIC CHOLECYSTECTOMY;  Surgeon: Kinsinger, Arta Bruce, MD;  Location: [facility];  Service: General;  Laterality: N/A;  . KNEE ARTHROSCOPY Right    family history includes Diabetes in his paternal grandmother.  OBJECTIVE:  VS:  HT:5\' 9"  (175.3 cm)   WT:162 lb 6.4 oz (73.7 kg)  BMI:23.97    BP:110/68  HR:75bpm  TEMP: ( )  RESP:94 %   PHYSICAL EXAM: CONSTITUTIONAL: Well-developed, Well-nourished and In no acute distress EYES: Pupils are equal., EOM intact without nystagmus. and No scleral icterus. Psychiatric: Alert & appropriately interactive. and Not depressed or anxious appearing. EXTREMITY EXAM: Warm and well perfused  Right hand has marked swelling of the third finger at the PIP joint.  There is a small amount of surrounding erythema but no streaking.  He has ballottement with direct pressure over the area.  It is ligamentously stable but does cause some discomfort.  Normal finger flexion and extension strength.  Good capillary refill.  Sensation intact light touch.   ASSESSMENT:  1. Crystal arthropathy   2. Finger pain, right     PROCEDURES:      PLAN:  Pertinent additional documentation may be included in corresponding procedure notes, imaging  studies, problem based documentation and patient instructions.  No problem-specific Assessment & Plan notes found for this encounter.  Acute gouty arthropathy in the setting of chronic osteoarthritic changes.  Intra-articular injection should prove to be beneficial.    Activity modifications and the importance of avoiding exacerbating activities (limiting pain to no more than a 4 / 10 during or following activity) recommended and discussed.   Discussed red flag symptoms that warrant earlier emergent evaluation and patient voices understanding.    No orders of the defined types were placed in this encounter.  Lab Orders  No laboratory test(s) ordered today    Imaging Orders     [place] [facility] POCT ULTRASOUND Referral Orders  No referral(s) requested today    Return in about 4 weeks (around 10/23/2018).  Will need uric acid levels checked at follow-up most likely.         Gerda Diss, [facility]  Physician

## 2018-09-25 NOTE — Procedures (Signed)
PROCEDURE NOTE:  Ultrasound Guided: Injection: Right Third finger PIP joint Images were obtained and interpreted by myself, Teresa Coombs, DO  Images have been saved and stored to PACS system. Images obtained on: GE S7 Ultrasound machine    ULTRASOUND FINDINGS:  Moderately swollen IP joint with small effusion.  DESCRIPTION OF PROCEDURE:  The patient's clinical condition is marked by substantial pain and/or significant functional disability. Other conservative therapy has not provided relief, is contraindicated, or not appropriate. There is a reasonable likelihood that injection will significantly improve the patient's pain and/or functional impairment.   After discussing the risks, benefits and expected outcomes of the injection and all questions were reviewed and answered, the patient wished to undergo the above named procedure.  Verbal consent was obtained.  The ultrasound was used to identify the target structure and adjacent neurovascular structures. The skin was then prepped in sterile fashion and the target structure was injected under direct visualization using sterile technique as below:  Single injection performed as below: PREP: Alcohol, Ethel Chloride and 0.5cc of 1% lidocaine on insulin needle APPROACH:direct, single injection, 25g 1.5 in. INJECTATE: 0.5 cc 0.5% Marcaine and 0.5 cc 40mg /mL DepoMedrol ASPIRATE: None DRESSING: Band-Aid  Post procedural instructions including recommending icing and warning signs for infection were reviewed.    This procedure was well tolerated and there were no complications.   IMPRESSION: Succesful Ultrasound Guided: Injection

## 2018-09-27 ENCOUNTER — Encounter: Payer: Self-pay | Admitting: Sports Medicine

## 2018-10-23 ENCOUNTER — Ambulatory Visit (INDEPENDENT_AMBULATORY_CARE_PROVIDER_SITE_OTHER): Payer: Medicare HMO | Admitting: Sports Medicine

## 2018-10-23 ENCOUNTER — Encounter: Payer: Self-pay | Admitting: Sports Medicine

## 2018-10-23 VITALS — Ht 69.0 in

## 2018-10-23 DIAGNOSIS — M119 Crystal arthropathy, unspecified: Secondary | ICD-10-CM | POA: Diagnosis not present

## 2018-10-23 DIAGNOSIS — M79644 Pain in right finger(s): Secondary | ICD-10-CM | POA: Diagnosis not present

## 2018-10-23 NOTE — Progress Notes (Signed)
Isaiah Davidson. [name], [name]  Isaiah Davidson - 85 y.o. male MRN [id number]  Date of birth: [birth year]  Visit Date: 10/23/2018  PCP: Marin Olp, MD   Referred by: Marin Olp, MD   Virtual Visit via Video (WebEx)  I connected with Quincy Carnes on 10/23/18 at 10:40 AM EDT by a video enabled telemedicine application and verified that I am speaking with the correct person using two identifiers. Location patient: Home Location provider: Provider office Persons participating in the virtual visit: Patient only  I discussed the limitations of evaluation and management by telemedicine and the availability of in person appointments. The patient expressed understanding and agreed to proceed.   SUBJECTIVE:   Chief Complaint  Patient presents with  . Right Middle Finger - Follow-up    Corticosteroid inj 09/25/18, good relief. No pain or swelling. Is able to make a fist without trouble.     HPI: Patient reports that overall he is significantly improved following the injection last month.  Reports almost complete resolution of his symptoms after 1 week.  He is able to make a complete fist at this time.  No significant limitations in his function or day-to-day activities.  REVIEW OF SYSTEMS: No significant nighttime awakenings due to this issue. Denies fevers, chills, recent weight gain or weight loss.  No night sweats.   HISTORY:  Prior history reviewed and updated per electronic medical record.  Patient Active Problem List   Diagnosis Date Noted  . Diarrhea 07/09/2018  . Status post cholecystectomy 06/15/2018  . Lipoma 02/02/2017  . Aortic atherosclerosis ([place]) 08/05/2016  . 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 Finger  infection 06/11/2014    Second PIP joint left hand   . MCI (mild cognitive impairment) with memory loss 05/14/2014    Neurology follows. Aricept 5mg -did not tolerate 10mg .    . Shortness of breath 10/12/2012  . Seizure disorder ([place]) 04/19/2011    Pt reports seizure hx (one prolonged seizure 2011)--sees neurology. Keppra since that time.    . Carpal tunnel syndrome 08/18/2010    Dx 2012- surgery right hand. States somehow left hand improved after right hand surgery.  Left-sided injection 10/08/2014   . Cyst and pseudocyst of pancreas 04/12/2010    09/27/2009-? Pancreatic lesion on CT 11/30/09 Dr. Hughie Closs feels much better after H. pylori eradication. All of his upper GI symptoms have completely resolved. I suspect that the pancreatic duct dilation that was noted on CT scan was completely incidental and unrelated to his symptoms. Given that the endoscopic ultrasound did not find any concerning associations, masses or lesions, I do not think he needs any dedicated imaging surveillance. He will however continue Nexium for another one to 2 months and try tapering off.   . Hyperlipidemia 02/22/2007    Simvastatin 40mg  Lab Results  Component Value Date   CHOL 201* 09/24/2013   HDL 51.90 09/24/2013   LDLCALC 93 09/24/2013   LDLDIRECT 99.0 01/19/2015   TRIG 281.0* 09/24/2013   CHOLHDL 4 09/24/2013      . Essential hypertension 02/22/2007    Chlorthalidone 25mg , amlodipine 10mg     Social History   Occupational History    Employer: RETIRED  Tobacco Use  . Smoking  status: Former Smoker    Packs/day: 1.00    Years: 30.00    Pack years: 30.00    Types: Cigars, Cigarettes    Last attempt to quit: 07/11/1978    Years since quitting: 40.3  . Smokeless tobacco: Never Used  Substance and Sexual Activity  . Alcohol use: Not Currently    Alcohol/week: 0.0 standard drinks  . Drug use: Never  . Sexual activity: Not Currently   Social History   Social History Narrative   Patient is  married ([name]).   Patient has 3 children. 2 grandkids. 3 greatgrandkids.    Patient is retired Administrator.    Patient is right-handed.   Patient has a 10th grade education.   Patient drinks one cup of coffee daily.      Hobbies: working in the yard- gardening competition with neighbor on who can grow best vegetables, "messing around the house"      07/21/14- wants wife to make decisions for him. Not sure if he has advanced directives.        OBJECTIVE:  VS:  HT:5\' 9"  (175.3 cm)   WT:   BMI:     BP:   HR: bpm  TEMP: ( )  RESP:    PHYSICAL EXAM: PSYCH: Pleasant and cooperative, well-groomed. Speech normal rate and rhythm. Affect is appropriate. Insight and judgement are appropriate. Attention is focused, linear, and appropriate.  There were technical difficulties connecting to the video portion.  This was an audio only virtual visit   ASSESSMENT:   1. Crystal arthropathy   2. Finger pain, right     PROCEDURES:  None  PLAN:  Pertinent additional documentation may be included in corresponding procedure notes, imaging studies, problem based documentation and patient instructions.  No problem-specific Assessment & Plan notes found for this encounter.   Overall he is back to baseline.  He has had one other flareup of this in the past and if any recurrent symptoms could consider repeat injection versus checking uric acid levels.  Given the fact he is doing quite well no further intervention recommended at this time.  Activity modifications and the importance of avoiding exacerbating activities (limiting pain to no more than a 4 / 10 during or following activity) recommended and discussed.   Discussed red flag symptoms that warrant earlier emergent evaluation and patient voices understanding.   No orders of the defined types were placed in this encounter.  Lab Orders  No laboratory test(s) ordered today   Imaging Orders  No imaging studies ordered today   Referral  Orders  No referral(s) requested today    Return if symptoms worsen or fail to improve.          Gerda Diss, [facility] Physician

## 2018-11-08 ENCOUNTER — Other Ambulatory Visit: Payer: Self-pay | Admitting: Adult Health

## 2018-11-15 ENCOUNTER — Telehealth: Payer: Self-pay

## 2018-11-15 NOTE — Telephone Encounter (Signed)
Unable to get in contact with the patient to convert his office appt with Megan on 11/20/2018 into a doxy.me visit. The phone kept ringing. I was unable to leave a voicemail. I will attempt to call back later today.

## 2018-11-19 NOTE — Telephone Encounter (Signed)
Spoke to pt.  He consented to VV via [employer] [phone] [employer].  Updated chart.  Due to current COVID 19 pandemic, our office is severely reducing in office visits until further notice, in order to minimize the risk to our patients and healthcare providers.  Pt understands that although there may be some limitations with this type of visit, we will take all precautions to reduce any security or privacy concerns.  Pt understands that this will be treated like an in office visit and we will file with pt's insurance.

## 2018-11-20 ENCOUNTER — Other Ambulatory Visit: Payer: Self-pay

## 2018-11-20 ENCOUNTER — Encounter: Payer: Medicare HMO | Admitting: Adult Health

## 2018-11-20 NOTE — Telephone Encounter (Signed)
Spoke with the patient and he has been scheduled to come in the office and see Megan on 11/27/2018 at 10:30 am. Patient is aware of the appt and time.

## 2018-11-20 NOTE — Telephone Encounter (Signed)
Noted  

## 2018-11-20 NOTE — Progress Notes (Signed)
This encounter was created in error - please disregard.  This encounter was created in error - please disregard.

## 2018-11-20 NOTE — Telephone Encounter (Signed)
Patient was unable to log on for virtual visit. Please call and setup an in office visit.

## 2018-11-27 ENCOUNTER — Other Ambulatory Visit: Payer: Self-pay

## 2018-11-27 ENCOUNTER — Ambulatory Visit (INDEPENDENT_AMBULATORY_CARE_PROVIDER_SITE_OTHER): Payer: Medicare HMO | Admitting: Adult Health

## 2018-11-27 ENCOUNTER — Encounter: Payer: Self-pay | Admitting: Adult Health

## 2018-11-27 ENCOUNTER — Telehealth: Payer: Self-pay | Admitting: Adult Health

## 2018-11-27 VITALS — BP 145/76 | HR 71 | Temp 97.5°F | Wt 161.6 lb

## 2018-11-27 DIAGNOSIS — R569 Unspecified convulsions: Secondary | ICD-10-CM

## 2018-11-27 DIAGNOSIS — R413 Other amnesia: Secondary | ICD-10-CM | POA: Diagnosis not present

## 2018-11-27 MED ORDER — DONEPEZIL HCL 10 MG PO TABS: 10.0000 mg | ORAL_TABLET | Freq: Every day | ORAL | 3 refills | Status: DC

## 2018-11-27 NOTE — Patient Instructions (Signed)
Your Plan:  Continue Keppra Increase Aricept to 10 mg at bedtime Monitor for weight loss If your symptoms worsen or you develop new symptoms please let us know.   Donepezil tablets What is this medicine? DONEPEZIL (doe NEP e zil) is used to treat mild to moderate dementia caused by Alzheimer's disease. This medicine may be used for other purposes; ask your health care provider or pharmacist if you have questions. COMMON BRAND NAME(S): Aricept What should I tell my health care provider before I take this medicine? They need to know if you have any of these conditions: -asthma or other lung disease -difficulty passing urine -head injury -heart disease -history of irregular heartbeat -liver disease -seizures (convulsions) -stomach or intestinal disease, ulcers or stomach bleeding -an unusual or allergic reaction to donepezil, other medicines, foods, dyes, or preservatives -pregnant or trying to get pregnant -breast-feeding How should I use this medicine? Take this medicine by mouth with a glass of water. Follow the directions on the prescription label. You may take this medicine with or without food. Take this medicine at regular intervals. This medicine is usually taken before bedtime. Do not take it more often than directed. Continue to take your medicine even if you feel better. Do not stop taking except on your doctor's advice. If you are taking the 23 mg donepezil tablet, swallow it whole; do not cut, crush, or chew it. Talk to your pediatrician regarding the use of this medicine in children. Special care may be needed. Overdosage: If you think you have taken too much of this medicine contact a poison control center or emergency room at once. NOTE: This medicine is only for you. Do not share this medicine with others. What if I miss a dose? If you miss a dose, take it as soon as you can. If it is almost time for your next dose, take only that dose, do not take double or extra  doses. What may interact with this medicine? Do not take this medicine with any of the following medications: -certain medicines for fungal infections like itraconazole, fluconazole, posaconazole, and voriconazole -cisapride -dextromethorphan; quinidine -dofetilide -dronedarone -pimozide -quinidine -thioridazine -ziprasidone This medicine may also interact with the following medications: -antihistamines for allergy, cough and cold -atropine -bethanechol -carbamazepine -certain medicines for bladder problems like oxybutynin, tolterodine -certain medicines for Parkinson's disease like benztropine, trihexyphenidyl -certain medicines for stomach problems like dicyclomine, hyoscyamine -certain medicines for travel sickness like scopolamine -dexamethasone -ipratropium -NSAIDs, medicines for pain and inflammation, like ibuprofen or naproxen -other medicines for Alzheimer's disease -other medicines that prolong the QT interval (cause an abnormal heart rhythm) -phenobarbital -phenytoin -rifampin, rifabutin or rifapentine This list may not describe all possible interactions. Give your health care provider a list of all the medicines, herbs, non-prescription drugs, or dietary supplements you use. Also tell them if you smoke, drink alcohol, or use illegal drugs. Some items may interact with your medicine. What should I watch for while using this medicine? Visit your doctor or health care professional for regular checks on your progress. Check with your doctor or health care professional if your symptoms do not get better or if they get worse. You may get drowsy or dizzy. Do not drive, use machinery, or do anything that needs mental alertness until you know how this drug affects you. What side effects may I notice from receiving this medicine? Side effects that you should report to your doctor or health care professional as soon as possible: -allergic reactions like skin rash, itching  or hives,  swelling of the face, lips, or tongue -feeling faint or lightheaded, falls -loss of bladder control -seizures -signs and symptoms of a dangerous change in heartbeat or heart rhythm like chest pain; dizziness; fast or irregular heartbeat; palpitations; feeling faint or lightheaded, falls; breathing problems -signs and symptoms of infection like fever or chills; cough; sore throat; pain or trouble passing urine -signs and symptoms of liver injury like dark yellow or brown urine; general ill feeling or flu-like symptoms; light-colored stools; loss of appetite; nausea; right upper belly pain; unusually weak or tired; yellowing of the eyes or skin -slow heartbeat or palpitations -unusual bleeding or bruising -vomiting Side effects that usually do not require medical attention (report to your doctor or health care professional if they continue or are bothersome): -diarrhea, especially when starting treatment -headache -loss of appetite -muscle cramps -nausea -stomach upset This list may not describe all possible side effects. Call your doctor for medical advice about side effects. You may report side effects to FDA at 1-800-FDA-1088. Where should I keep my medicine? Keep out of reach of children. Store at room temperature between 15 and 30 degrees C (59 and 86 degrees F). Throw away any unused medicine after the expiration date. NOTE: This sheet is a summary. It may not cover all possible information. If you have questions about this medicine, talk to your doctor, pharmacist, or health care provider.  2019 Elsevier/Gold Standard (2015-12-14 21:00:42)

## 2018-11-27 NOTE — Addendum Note (Signed)
Addended by: Brandon Melnick on: 11/27/2018 02:29 PM   Modules accepted: Orders

## 2018-11-27 NOTE — Progress Notes (Signed)
I have read the note, and I agree with the clinical assessment and plan.  Charles K Willis   

## 2018-11-27 NOTE — Telephone Encounter (Signed)
Pt wife is asking the donepezil (ARICEPT) 10 MG tablet be filled through [facility]

## 2018-11-27 NOTE — Progress Notes (Signed)
PATIENT: Isaiah Davidson DOB: 1931-08-14  REASON FOR VISIT: follow up HISTORY FROM: patient  HISTORY OF PRESENT ILLNESS: Today 11/27/18:  Mr. Hylton is an 85 year old male with a history of memory disturbance and seizures.  He returns today for follow-up.  He reports that his memory has remained stable.  He is able to complete all ADLs independently.  He operates a Teacher, music without difficulty.  Reports that he does some cooking without difficulty.  Reports good appetite.  No change in his mood or behavior.  He continues on Keppra 500 mg twice a day for seizures.  Denies any seizure events.  At the last visit he was started on Aricept 5 mg at bedtime for memory.  Reports that he has tolerated this well.  HISTORY 11/14/17 Mr. Wyss is an 85 year old male with a history of memory disturbance and seizures.  He returns today for follow-up.  He denies any seizure events.  He remains on Keppra 500 mg twice a day.  He is able to complete all ADLs independently.  He manages his own finances.  Denies any change in his mood or behavior.  No change in his gait or balance.  He feels that overall his memory has remained stable.  He lives at home alone.  He remains on Aricept 5 mg daily.  He returns today for an evaluation.  REVIEW OF SYSTEMS: Out of a complete 14 system review of symptoms, the patient complains only of the following symptoms, and all other reviewed systems are negative.  See HPI  ALLERGIES: No Known Allergies  HOME MEDICATIONS: Outpatient Medications Prior to Visit  Medication Sig Dispense Refill  . amLODipine (NORVASC) 10 MG tablet TAKE 1 TABLET EVERY DAY (Patient taking differently: Take 10 mg by mouth daily. ) 90 tablet 3  . chlorthalidone (HYGROTON) 25 MG tablet TAKE 1 TABLET EVERY DAY (Patient taking differently: Take 25 mg by mouth daily. ) 90 tablet 3  . donepezil (ARICEPT) 5 MG tablet Take 1 tablet (5 mg total) by mouth at bedtime. 90 tablet 1  . levETIRAcetam (KEPPRA)  500 MG tablet Take 500 mg by mouth 2 (two) times daily.    . Multiple Vitamin (MULTIVITAMIN) capsule Take 1 capsule by mouth daily.      . simvastatin (ZOCOR) 40 MG tablet TAKE 1 TABLET EVERY DAY (Patient taking differently: Take 40 mg by mouth daily. ) 90 tablet 3   No facility-administered medications prior to visit.     PAST MEDICAL HISTORY: Past Medical History:  Diagnosis Date  . Heart murmur   . HYPERLIPIDEMIA 02/22/2007  . HYPERTENSION 02/22/2007  . MCI (mild cognitive impairment) with memory loss 05/14/2014  . Seizures ([place])    treated for siezures around 2010    PAST SURGICAL HISTORY: Past Surgical History:  Procedure Laterality Date  . CARPAL TUNNEL RELEASE Bilateral   . CHOLECYSTECTOMY N/A 06/15/2018   Procedure: LAPAROSCOPIC CHOLECYSTECTOMY;  Surgeon: Kinsinger, Arta Bruce, MD;  Location: [place];  Service: General;  Laterality: N/A;  . KNEE ARTHROSCOPY Right     FAMILY HISTORY: Family History  Problem Relation Age of Onset  . Diabetes Paternal Grandmother        raised by grandparents    SOCIAL HISTORY: Social History   Socioeconomic History  . Marital status: Married    Spouse name: [name]  . Number of children: 2  . Years of education: 10  . Highest education level: Not on file  Occupational History    Employer: RETIRED  Social Needs  . Financial resource strain: Not on file  . Food insecurity:    Worry: Not on file    Inability: Not on file  . Transportation needs:    Medical: Not on file    Non-medical: Not on file  Tobacco Use  . Smoking status: Former Smoker    Packs/day: 1.00    Years: 30.00    Pack years: 30.00    Types: Cigars, Cigarettes    Last attempt to quit: 07/11/1978    Years since quitting: 40.4  . Smokeless tobacco: Never Used  Substance and Sexual Activity  . Alcohol use: Not Currently    Alcohol/week: 0.0 standard drinks  . Drug use: Never  . Sexual activity: Not Currently  Lifestyle  . Physical activity:    Days per  week: Not on file    Minutes per session: Not on file  . Stress: Not on file  Relationships  . Social connections:    Talks on phone: Not on file    Gets together: Not on file    Attends religious service: Not on file    Active member of club or organization: Not on file    Attends meetings of clubs or organizations: Not on file    Relationship status: Not on file  . Intimate partner violence:    Fear of current or ex partner: Not on file    Emotionally abused: Not on file    Physically abused: Not on file    Forced sexual activity: Not on file  Other Topics Concern  . Not on file  Social History Narrative   Patient is married ([name]).   Patient has 3 children. 2 grandkids. 3 greatgrandkids.    Patient is retired Administrator.    Patient is right-handed.   Patient has a 10th grade education.   Patient drinks one cup of coffee daily.      Hobbies: working in the yard- gardening competition with neighbor on who can grow best vegetables, "messing around the house"      07/21/14- wants wife to make decisions for him. Not sure if he has advanced directives.          PHYSICAL EXAM MMSE - Mini Mental State Exam 11/27/2018 11/14/2017 05/16/2017  Not completed: - - -  Orientation to time 5 5 5   Orientation to Place 5 5 5   Registration 3 3 3   Attention/ Calculation 1 4 1   Attention/Calculation-comments - - -  Recall 1 3 2   Language- name 2 objects 2 2 2   Language- repeat 1 1 1   Language- follow 3 step command 3 3 3   Language- read & follow direction 1 1 1   Write a sentence 0 0 1  Copy design 0 0 0  Total score 22 27 24     Vitals:   11/27/18 1047  BP: (!) 145/76  Pulse: 71  Temp: (!) 97.5 F (36.4 C)  Weight: 161 lb 9.6 oz (73.3 kg)   Body mass index is 23.86 kg/m.  Generalized: Well developed, in no acute distress   Neurological examination  Mentation: Alert oriented to time, place, history taking. Follows all commands speech and language fluent Cranial nerve  II-XII: Pupils were equal round reactive to light. Extraocular movements were full, visual field were full on confrontational test. Facial sensation and strength were normal. Uvula tongue midline. Head turning and shoulder shrug  were normal and symmetric. Motor: The motor testing reveals 5 over 5 strength of all 4 extremities.  Good symmetric motor tone is noted throughout.  Sensory: Sensory testing is intact to soft touch on all 4 extremities. No evidence of extinction is noted.  Coordination: Cerebellar testing reveals good finger-nose-finger and heel-to-shin bilaterally.  Gait and station: Gait is normal.    DIAGNOSTIC DATA (LABS, IMAGING, TESTING) - I reviewed patient records, labs, notes, testing and imaging myself where available.  Lab Results  Component Value Date   WBC 8.4 07/23/2018   HGB 11.5 (L) 07/23/2018   HCT 33.9 (L) 07/23/2018   MCV 85.4 07/23/2018   PLT 407.0 (H) 07/23/2018      Component Value Date/Time   NA 138 07/23/2018 1025   K 4.0 07/23/2018 1025   CL 99 07/23/2018 1025   CO2 28 07/23/2018 1025   GLUCOSE 90 07/23/2018 1025   BUN 16 07/23/2018 1025   CREATININE 1.39 07/23/2018 1025   CALCIUM 9.7 07/23/2018 1025   PROT 7.6 07/23/2018 1025   ALBUMIN 3.9 07/23/2018 1025   AST 17 07/23/2018 1025   ALT 13 07/23/2018 1025   ALKPHOS 80 07/23/2018 1025   BILITOT 0.5 07/23/2018 1025   GFRNONAA 44 (L) 07/11/2018 0459   GFRAA 51 (L) 07/11/2018 0459   Lab Results  Component Value Date   CHOL 220 (H) 01/18/2018   HDL 52.50 01/18/2018   LDLCALC 93 09/24/2013   LDLDIRECT 110.0 07/23/2018   TRIG 212.0 (H) 01/18/2018   CHOLHDL 4 01/18/2018   Lab Results  Component Value Date   HGBA1C  02/23/2009    5.6 (NOTE) The ADA recommends the following therapeutic goal for glycemic control related to Hgb A1c measurement: Goal of therapy: <6.5 Hgb A1c  Reference: American Diabetes Association: Clinical Practice Recommendations 2010, Diabetes Care, 2010, 33: (Suppl  1).    No results found for: VITAMINB12 Lab Results  Component Value Date   TSH 1.30 10/01/2012      ASSESSMENT AND PLAN 85 y.o. year old male  has a past medical history of Heart murmur, HYPERLIPIDEMIA (02/22/2007), HYPERTENSION (02/22/2007), MCI (mild cognitive impairment) with memory loss (05/14/2014), and Seizures ([place]). here with:  1.  Memory disturbance 2.  Seizures  The patient's memory score has declined slightly.  I will increase Aricept to 10 mg at bedtime.  Advised the patient to monitor for weight loss.  Also gave him a handout reviewing his medication.  He will continue on Keppra 500 mg twice a day.  He is advised that if his symptoms worsen or he develops new symptoms he should let us know.  He will follow-up in 6 months or sooner if needed.     Ward Givens, MSN, NP-C 11/27/2018, 10:49 AM [facility] [address] [place], [place] [address] [phone]

## 2018-11-28 ENCOUNTER — Ambulatory Visit (INDEPENDENT_AMBULATORY_CARE_PROVIDER_SITE_OTHER): Payer: Medicare HMO | Admitting: Family Medicine

## 2018-11-28 ENCOUNTER — Encounter: Payer: Self-pay | Admitting: Family Medicine

## 2018-11-28 VITALS — BP 145/76 | HR 71 | Ht 69.0 in | Wt 161.0 lb

## 2018-11-28 DIAGNOSIS — I10 Essential (primary) hypertension: Secondary | ICD-10-CM | POA: Diagnosis not present

## 2018-11-28 DIAGNOSIS — E785 Hyperlipidemia, unspecified: Secondary | ICD-10-CM

## 2018-11-28 DIAGNOSIS — M119 Crystal arthropathy, unspecified: Secondary | ICD-10-CM

## 2018-11-28 MED ORDER — CHLORTHALIDONE 25 MG PO TABS: 25.0000 mg | ORAL_TABLET | Freq: Every day | ORAL | 3 refills | Status: DC

## 2018-11-28 MED ORDER — ATORVASTATIN CALCIUM 20 MG PO TABS: 20.0000 mg | ORAL_TABLET | Freq: Every day | ORAL | 3 refills | Status: DC

## 2018-11-28 MED ORDER — SIMVASTATIN 40 MG PO TABS: 40.0000 mg | ORAL_TABLET | Freq: Every day | ORAL | 3 refills | Status: DC

## 2018-11-28 MED ORDER — ALLOPURINOL 100 MG PO TABS: 100.0000 mg | ORAL_TABLET | Freq: Every day | ORAL | 3 refills | Status: DC

## 2018-11-28 MED ORDER — AMLODIPINE BESYLATE 10 MG PO TABS: 10.0000 mg | ORAL_TABLET | Freq: Every day | ORAL | 3 refills | Status: DC

## 2018-11-28 NOTE — Patient Instructions (Addendum)
He is to call back to push July visit until late september

## 2018-11-28 NOTE — Progress Notes (Signed)
Phone [phone]   Subjective:  Virtual visit via phonenote Chief Complaint  Patient presents with  . Follow-up  . Hyperlipidemia    Taking Simvastatin with no side effects.   . Hypertension    Checking BP at home, has been running around 145/76. Taking Amlodipine and Chlorthalidone, has not missed any doses.   . Gout    Saw Dr. Paulla Fore in March and April for gout flare. No flare-ups since then.     This visit type was conducted due to national recommendations for restrictions regarding the COVID-19 Pandemic (e.g. social distancing).  This format is felt to be most appropriate for this patient at this time balancing risks to patient and risks to population by having him in for in person visit.  All issues noted in this document were discussed and addressed.  No physical exam was performed (except for noted visual exam or audio findings with Telehealth visits).  The patient has consented to conduct a Telehealth visit and understands insurance will be billed.   Our team/I connected with Quincy Carnes at 10:40 AM EDT by phone (patient did not have equipment for webex) and verified that I am speaking with the correct person using two identifiers.  Location patient: [facility] Location provider: [facility], office Persons participating in the virtual visit:  patient  Time on phone: 14 minutes Counseling provided about covid 19, blood pressure goal  Our team/I discussed the limitations of evaluation and management by telemedicine and the availability of in person appointments. In light of current covid-19 pandemic, patient also understands that we are trying to protect them by minimizing in office contact if at all possible.  The patient expressed consent for telemedicine visit and agreed to proceed. Patient understands insurance will be billed.   ROS- No chest pain or shortness of breath. No headache or blurry vision.    Past Medical History-  Patient Active Problem List   Diagnosis Date  Noted  . MCI (mild cognitive impairment) with memory loss 05/14/2014    Priority: High  . Seizure disorder ([place]) 04/19/2011    Priority: High  . Aortic atherosclerosis ([place]) 08/05/2016    Priority: Medium  . 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 Hyperlipidemia 02/22/2007    Priority: Medium  . Essential hypertension 02/22/2007    Priority: Medium  . Status post cholecystectomy 06/15/2018    Priority: Low  . Lipoma 02/02/2017    Priority: Low  . Finger infection 06/11/2014    Priority: Low  . Shortness of breath 10/12/2012    Priority: Low  . Carpal tunnel syndrome 08/18/2010    Priority: Low  . Cyst and pseudocyst of pancreas 04/12/2010    Priority: Low  . Diarrhea 07/09/2018    Medications- reviewed and updated Current Outpatient Medications  Medication Sig Dispense Refill  . allopurinol (ZYLOPRIM) 100 MG tablet Take 1 tablet (100 mg total) by mouth daily. 90 tablet 3  . amLODipine (NORVASC) 10 MG tablet Take 1 tablet (10 mg total) by mouth daily. 90 tablet 3  . atorvastatin (LIPITOR) 20 MG tablet Take 1 tablet (20 mg total) by mouth daily. Change in prescription. Stop simvastatin. Start atorvastatin. 90 tablet 3  . chlorthalidone (HYGROTON) 25 MG tablet Take 1 tablet (25 mg total) by mouth daily. 90 tablet 3  . donepezil (ARICEPT) 10 MG tablet Take 1 tablet (10 mg total) by mouth at bedtime. 90 tablet 3  . levETIRAcetam (KEPPRA) 500 MG tablet Take 500 mg by mouth 2 (  two) times daily.    . Multiple Vitamin (MULTIVITAMIN) capsule Take 1 capsule by mouth daily.       No current facility-administered medications for this visit.      Objective:  BP (!) 145/76   Pulse 71   Ht 5\' 9"  (1.753 m)   Wt 161 lb (73 kg)   BMI 23.78 kg/m  self reported vitals - using bp # from yesterday- does not have home cuff Nonlabored voice, normal speech      Assessment and Plan   #hypertension S: controlled previously on amlodipine 10mg  and chlorthalidone 25mg . Mild  elevation but still at jnc8 goals for age. Does not have home cuff BP Readings from Last 3 Encounters:  11/28/18 (!) 145/76  11/27/18 (!) 145/76  09/25/18 110/68  A/P: slight elevation yesterday and he was resporting same # today- advised 4 month follow up for in person check. Ideally <140/90 but still with JNC8 guidelines <150/90 so we felt ok pushing in person check out to time that he also needs labs  #hyperlipidemia S: mild poorly controlled on simvastatin 40mg - he is compliant with medicine Lab Results  Component Value Date   CHOL 220 (H) 01/18/2018   HDL 52.50 01/18/2018   LDLCALC 93 09/24/2013   LDLDIRECT 110.0 07/23/2018   TRIG 212.0 (H) 01/18/2018   CHOLHDL 4 01/18/2018   A/P: due to also being on amlodipine- we opted to change to atorvastatin 20mg - update lipids at next visit in 4 months.    # Gout/crystal arthropathy S:no flare ups in last month. Chlorthalidone does slightly increase risk but has been needed for BP. He would like to be on medicine to prevent flare up  Lab Results  Component Value Date   LABURIC 8.6 (H) 07/18/2017  A/P: last uric acid was high- start allopurinol 100mg - check uric acid at follow up and try to push for uric acid level under 6.   - would refer to DR. Tamala Julian if needed in future  Other notes: 1.covid education- doing his best with social distancing- encouraged him to continue this.    4 month flu shot and follow up-He is to call back to push July visit until late september Future Appointments  Date Time Provider [place]  01/22/2019  9:40 AM Marin Olp, MD [facility]  06/04/2019  9:00 AM Ward Givens, NP [facility] None  07/25/2019  9:20 AM Marin Olp, MD [facility]   Lab/Order associations: Essential hypertension  Hyperlipidemia, unspecified hyperlipidemia type  Crystal arthropathy  Meds ordered this encounter  Medications  . amLODipine (NORVASC) 10 MG tablet    Sig: Take 1 tablet (10 mg total) by mouth  daily.    Dispense:  90 tablet    Refill:  3  . chlorthalidone (HYGROTON) 25 MG tablet    Sig: Take 1 tablet (25 mg total) by mouth daily.    Dispense:  90 tablet    Refill:  3  . DISCONTD: simvastatin (ZOCOR) 40 MG tablet    Sig: Take 1 tablet (40 mg total) by mouth daily.    Dispense:  90 tablet    Refill:  3  . allopurinol (ZYLOPRIM) 100 MG tablet    Sig: Take 1 tablet (100 mg total) by mouth daily.    Dispense:  90 tablet    Refill:  3  . atorvastatin (LIPITOR) 20 MG tablet    Sig: Take 1 tablet (20 mg total) by mouth daily. Change in prescription. Stop simvastatin. Start atorvastatin.  Dispense:  90 tablet    Refill:  3   Return precautions advised.  [name], MD

## 2018-11-28 NOTE — Assessment & Plan Note (Signed)
S:no flare ups in last month. Chlorthalidone does slightly increase risk but has been needed for BP. He would like to be on medicine to prevent flare up  Lab Results  Component Value Date   LABURIC 8.6 (H) 07/18/2017  A/P: last uric acid was high- start allopurinol 100mg - check uric acid at follow up and try to push for uric acid level under 6.   - would refer to DR. Tamala Julian if needed in future

## 2018-12-18 ENCOUNTER — Telehealth: Payer: Self-pay | Admitting: Family Medicine

## 2018-12-18 NOTE — Telephone Encounter (Signed)
Pt is followed by neuro. Did you okay to start prescribing this?  Please advise

## 2018-12-18 NOTE — Telephone Encounter (Signed)
Copied from [place] [phone]. Topic: Quick Communication - Rx Refill/Question >> Dec 18, 2018  1:09 PM Oneta Rack wrote: Medication: levETIRAcetam (KEPPRA) 500 MG tablet     Preferred Pharmacy (with phone number or street name):  [facility], [place] [phone] (Phone) [phone] (Fax)    Agent: Please be advised that RX refills may take up to 3 business days. We ask that you follow-up with your pharmacy.

## 2018-12-18 NOTE — Telephone Encounter (Signed)
Good catch Tillie Rung- should be prescribed by neurology. Will forward to [facility] who saw patient last.

## 2018-12-18 NOTE — Telephone Encounter (Signed)
See request °

## 2018-12-19 ENCOUNTER — Other Ambulatory Visit: Payer: Self-pay | Admitting: *Deleted

## 2018-12-19 DIAGNOSIS — Z20822 Contact with and (suspected) exposure to covid-19: Secondary | ICD-10-CM

## 2018-12-19 MED ORDER — LEVETIRACETAM 500 MG PO TABS: 500.0000 mg | ORAL_TABLET | Freq: Two times a day (BID) | ORAL | 3 refills | Status: DC

## 2018-12-19 NOTE — Telephone Encounter (Signed)
keppra refilled

## 2018-12-20 LAB — NOVEL CORONAVIRUS, NAA: SARS-CoV-2, NAA: NOT DETECTED

## 2019-01-22 ENCOUNTER — Ambulatory Visit: Payer: Medicare HMO | Admitting: Family Medicine

## 2019-03-11 ENCOUNTER — Telehealth: Payer: Self-pay | Admitting: Family Medicine

## 2019-03-11 NOTE — Telephone Encounter (Signed)
I called the patient to schedule AWV with Loma Sousa, but it sounded like someone answered then hung up.  I tried calling again, and the same thing happened. VDM (Dee-Dee)

## 2019-04-01 ENCOUNTER — Ambulatory Visit (INDEPENDENT_AMBULATORY_CARE_PROVIDER_SITE_OTHER): Payer: Medicare HMO | Admitting: Family Medicine

## 2019-04-01 ENCOUNTER — Encounter: Payer: Self-pay | Admitting: Family Medicine

## 2019-04-01 ENCOUNTER — Other Ambulatory Visit: Payer: Self-pay

## 2019-04-01 VITALS — BP 130/72 | HR 61 | Temp 97.8°F | Wt 163.6 lb

## 2019-04-01 DIAGNOSIS — M119 Crystal arthropathy, unspecified: Secondary | ICD-10-CM | POA: Diagnosis not present

## 2019-04-01 DIAGNOSIS — I10 Essential (primary) hypertension: Secondary | ICD-10-CM | POA: Diagnosis not present

## 2019-04-01 DIAGNOSIS — G40909 Epilepsy, unspecified, not intractable, without status epilepticus: Secondary | ICD-10-CM

## 2019-04-01 DIAGNOSIS — E785 Hyperlipidemia, unspecified: Secondary | ICD-10-CM | POA: Diagnosis not present

## 2019-04-01 DIAGNOSIS — G3184 Mild cognitive impairment, so stated: Secondary | ICD-10-CM | POA: Diagnosis not present

## 2019-04-01 DIAGNOSIS — I700 Atherosclerosis of aorta: Secondary | ICD-10-CM

## 2019-04-01 LAB — COMPREHENSIVE METABOLIC PANEL
ALT: 15 U/L (ref 0–53)
AST: 23 U/L (ref 0–37)
Albumin: 4.3 g/dL (ref 3.5–5.2)
Alkaline Phosphatase: 109 U/L (ref 39–117)
BUN: 22 mg/dL (ref 6–23)
CO2: 30 mEq/L (ref 19–32)
Calcium: 10.2 mg/dL (ref 8.4–10.5)
Chloride: 100 mEq/L (ref 96–112)
Creatinine, Ser: 1.51 mg/dL — ABNORMAL HIGH (ref 0.40–1.50)
GFR: 53.37 mL/min — ABNORMAL LOW (ref >60.00)
Glucose, Bld: 85 mg/dL (ref 70–99)
Potassium: 4.6 mEq/L (ref 3.5–5.1)
Sodium: 139 mEq/L (ref 135–145)
Total Bilirubin: 0.6 mg/dL (ref 0.2–1.2)
Total Protein: 7.7 g/dL (ref 6.0–8.3)

## 2019-04-01 LAB — CBC
HCT: 39.2 % (ref 39.0–52.0)
Hemoglobin: 13.2 g/dL (ref 13.0–17.0)
MCHC: 33.6 g/dL (ref 30.0–36.0)
MCV: 88.6 fl (ref 78.0–100.0)
Platelets: 278 10*3/uL (ref 150.0–400.0)
RBC: 4.42 Mil/uL (ref 4.22–5.81)
RDW: 14.5 % (ref 11.5–15.5)
WBC: 5.5 10*3/uL (ref 4.0–10.5)

## 2019-04-01 LAB — LDL CHOLESTEROL, DIRECT: Direct LDL: 100 mg/dL

## 2019-04-01 LAB — LIPID PANEL
Cholesterol: 242 mg/dL — ABNORMAL HIGH (ref 0–200)
HDL: 52.2 mg/dL (ref >39.00)
NonHDL: 189.69
Total CHOL/HDL Ratio: 5
Triglycerides: 229 mg/dL — ABNORMAL HIGH (ref 0.0–149.0)
VLDL: 45.8 mg/dL — ABNORMAL HIGH (ref 0.0–40.0)

## 2019-04-01 LAB — URIC ACID: Uric Acid, Serum: 6.4 mg/dL (ref 4.0–7.8)

## 2019-04-01 NOTE — Progress Notes (Signed)
Phone [phone]   Subjective:  Isaiah Davidson is a 85 y.o. year old very pleasant male patient who presents for/with See problem oriented charting Chief Complaint  Patient presents with  . Follow-up  . Hypertension  . Hyperlipidemia  . Gout  . Mild Cognitive Impairment  . Seizure Disorder   ROS- Pt denies headache, blurred vision and chest pain. No shortness of breath   Past Medical History-  Patient Active Problem List   Diagnosis Date Noted  . MCI (mild cognitive impairment) with memory loss 05/14/2014    Priority: High  . Seizure disorder ([place]) 04/19/2011    Priority: High  . Aortic atherosclerosis ([place]) 08/05/2016    Priority: Medium  . 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 Hyperlipidemia 02/22/2007    Priority: Medium  . Essential hypertension 02/22/2007    Priority: Medium  . Status post cholecystectomy 06/15/2018    Priority: Low  . Lipoma 02/02/2017    Priority: Low  . Finger infection 06/11/2014    Priority: Low  . Shortness of breath 10/12/2012    Priority: Low  . Carpal tunnel syndrome 08/18/2010    Priority: Low  . Cyst and pseudocyst of pancreas 04/12/2010    Priority: Low  . Diarrhea 07/09/2018    Medications- reviewed and updated Current Outpatient Medications  Medication Sig Dispense Refill  . allopurinol (ZYLOPRIM) 100 MG tablet Take 1 tablet (100 mg total) by mouth daily. 90 tablet 3  . amLODipine (NORVASC) 10 MG tablet Take 1 tablet (10 mg total) by mouth daily. 90 tablet 3  . atorvastatin (LIPITOR) 20 MG tablet Take 1 tablet (20 mg total) by mouth daily. Change in prescription. Stop simvastatin. Start atorvastatin. 90 tablet 3  . chlorthalidone (HYGROTON) 25 MG tablet Take 1 tablet (25 mg total) by mouth daily. 90 tablet 3  . donepezil (ARICEPT) 10 MG tablet Take 1 tablet (10 mg total) by mouth at bedtime. 90 tablet 3  . levETIRAcetam (KEPPRA) 500 MG tablet Take 1 tablet (500 mg total) by mouth 2 (two) times daily. 180  tablet 3  . Multiple Vitamin (MULTIVITAMIN) capsule Take 1 capsule by mouth daily.       No current facility-administered medications for this visit.      Objective:  BP 130/72   Pulse 61   Temp 97.8 F (36.6 C)   Wt 163 lb 9.6 oz (74.2 kg)   SpO2 95%   BMI 24.16 kg/m  Gen: NAD, resting comfortably CV: RRR no murmurs rubs or gallops Lungs: CTAB no crackles, wheeze, rhonchi Abdomen: soft/nontender/nondistended/normal bowel sounds. No rebound or guarding.  Ext: no edema Skin: warm, dry Neuro: no gross deficits, normal speech    Assessment and Plan   #hypertension S: controlled on Amlodipine 10 mg daily, and Chlorthalidone 25 mg daily. Pt states he has been taking his BP at home and it is staying below 140/90 consistantly.  BP Readings from Last 3 Encounters:  04/01/19 130/72  11/28/18 (!) 145/76  11/27/18 (!) 145/76  A/P: Stable. Continue current medications.   #hyperlipidemia/aortic atherosclerosis S: hopefully controlled on Atorvastatin 20 mg daily- had to reduce simvastatin to 20mg  as was also on amlodipine and with worsened control had to change to atorvastatin.  Pt states he is doing well on his statin. Denies leg or muscle cramps.  atherosclerosis of the aorta as noted on prior imaging Lab Results  Component Value Date   CHOL 220 (H) 01/18/2018   HDL 52.50 01/18/2018   [place]  93 09/24/2013   LDLDIRECT 110.0 07/23/2018   TRIG 212.0 (H) 01/18/2018   CHOLHDL 4 01/18/2018   A/P: Target LDL at least under 100-update lipid panel today.  Adjust statin as needed.  Continue risk factor modification for aortic atherosclerosis  # Gout/crystal arthropathy S:Taking Allopurinol 100 mg daily.  No issues on allopurinol 100mg  since may Lab Results  Component Value Date   LABURIC 8.6 (H) 07/18/2017  A/P: hopefully uric acid is under 6 today - update and likely continue current dose. May increase if #s above 6.   # MCI/Seizure Disorder- follows with neurology S:Taking   Donepezil 10 mg daily and Levetiracetam 500 mg BID.  No recent recurrent seizures.  No worsening memory issues. A/P: both issues stable- continue current meds   Recommended follow up:  Already scheduled for physical- we discussed potentially pushing out depending on how covid/flu season is going  Future Appointments  Date Time Provider [place]  06/04/2019  9:00 AM Ward Givens, NP [facility] None  07/25/2019  9:20 AM Marin Olp, MD [facility]   Lab/Order associations:   ICD-10-CM   1. Essential hypertension  I10   2. Hyperlipidemia, unspecified hyperlipidemia type  E78.5 CBC    Comprehensive metabolic panel    Lipid panel  3. Crystal arthropathy  M11.9 Uric acid  4. MCI (mild cognitive impairment) with memory loss  G31.84   5. Seizure disorder ([place])  G40.909   6. Atherosclerosis of aorta ([facility]) Chronic I70.0    Return precautions advised.  [name], MD

## 2019-04-01 NOTE — Patient Instructions (Addendum)
Health Maintenance Due  Topic Date Due  . INFLUENZA VACCINE - thanks for doing this at [facility] 02/09/2019   Stop by the desk and reschedule January physical to 6-7 months from now  Please stop by lab before you go If you do not have mychart- we will call you about results within 5 business days of [place] receiving them.  If you have mychart- we will send your results within 3 business days of [place] receiving them.  If abnormal or we want to clarify a result, we will call or mychart you to make sure you receive the message.  If you have questions or concerns or don't hear within 5-7 days, please send [place] a message or call us.

## 2019-06-04 ENCOUNTER — Ambulatory Visit (INDEPENDENT_AMBULATORY_CARE_PROVIDER_SITE_OTHER): Payer: Medicare HMO | Admitting: Adult Health

## 2019-06-04 ENCOUNTER — Other Ambulatory Visit: Payer: Self-pay

## 2019-06-04 ENCOUNTER — Encounter: Payer: Self-pay | Admitting: Adult Health

## 2019-06-04 VITALS — BP 132/72 | HR 69 | Temp 97.3°F | Ht 69.0 in | Wt 168.0 lb

## 2019-06-04 DIAGNOSIS — R569 Unspecified convulsions: Secondary | ICD-10-CM | POA: Diagnosis not present

## 2019-06-04 DIAGNOSIS — R413 Other amnesia: Secondary | ICD-10-CM | POA: Diagnosis not present

## 2019-06-04 NOTE — Progress Notes (Signed)
PATIENT: Isaiah Davidson DOB: 11-Sep-1931  REASON FOR VISIT: follow up HISTORY FROM: patient  HISTORY OF PRESENT ILLNESS: Today 06/04/19:  Isaiah Davidson is an 85 year old male with a history of seizures and memory disturbance.  He returns today for follow-up.  He remains on Keppra 500 mg twice a day for seizures.  He denies any seizure events.  Denies any changes with his mood or behavior.  He remains on Aricept 10 mg at bedtime.  He reports that his memory has remained stable.  He is able to complete all ADLs independently.  He continues to operate a motor vehicle without difficulty.  He lives at home with his wife.  He returns today for an evaluation.  HISTORY 11/27/18:  Isaiah Davidson is an 85 year old male with a history of memory disturbance and seizures.  He returns today for follow-up.  He reports that his memory has remained stable.  He is able to complete all ADLs independently.  He operates a Teacher, music without difficulty.  Reports that he does some cooking without difficulty.  Reports good appetite.  No change in his mood or behavior.  He continues on Keppra 500 mg twice a day for seizures.  Denies any seizure events.  At the last visit he was started on Aricept 5 mg at bedtime for memory.  Reports that he has tolerated this well.  REVIEW OF SYSTEMS: Out of a complete 14 system review of symptoms, the patient complains only of the following symptoms, and all other reviewed systems are negative.  ALLERGIES: No Known Allergies  HOME MEDICATIONS: Outpatient Medications Prior to Visit  Medication Sig Dispense Refill  . allopurinol (ZYLOPRIM) 100 MG tablet Take 1 tablet (100 mg total) by mouth daily. 90 tablet 3  . amLODipine (NORVASC) 10 MG tablet Take 1 tablet (10 mg total) by mouth daily. 90 tablet 3  . atorvastatin (LIPITOR) 20 MG tablet Take 1 tablet (20 mg total) by mouth daily. Change in prescription. Stop simvastatin. Start atorvastatin. 90 tablet 3  . chlorthalidone  (HYGROTON) 25 MG tablet Take 1 tablet (25 mg total) by mouth daily. 90 tablet 3  . donepezil (ARICEPT) 10 MG tablet Take 1 tablet (10 mg total) by mouth at bedtime. 90 tablet 3  . levETIRAcetam (KEPPRA) 500 MG tablet Take 1 tablet (500 mg total) by mouth 2 (two) times daily. 180 tablet 3  . Multiple Vitamin (MULTIVITAMIN) capsule Take 1 capsule by mouth daily.       No facility-administered medications prior to visit.     PAST MEDICAL HISTORY: Past Medical History:  Diagnosis Date  . Heart murmur   . HYPERLIPIDEMIA 02/22/2007  . HYPERTENSION 02/22/2007  . MCI (mild cognitive impairment) with memory loss 05/14/2014  . Seizures ([place])    treated for siezures around 2010    PAST SURGICAL HISTORY: Past Surgical History:  Procedure Laterality Date  . CARPAL TUNNEL RELEASE Bilateral   . CHOLECYSTECTOMY N/A 06/15/2018   Procedure: LAPAROSCOPIC CHOLECYSTECTOMY;  Surgeon: Kinsinger, Arta Bruce, MD;  Location: [facility];  Service: General;  Laterality: N/A;  . KNEE ARTHROSCOPY Right     FAMILY HISTORY: Family History  Problem Relation Age of Onset  . Diabetes Paternal Grandmother        raised by grandparents    SOCIAL HISTORY: Social History   Socioeconomic History  . Marital status: Married    Spouse name: [name]  . Number of children: 2  . Years of education: 10  . Highest education level: Not  on file  Occupational History    Employer: RETIRED  Social Needs  . Financial resource strain: Not on file  . Food insecurity    Worry: Not on file    Inability: Not on file  . Transportation needs    Medical: Not on file    Non-medical: Not on file  Tobacco Use  . Smoking status: Former Smoker    Packs/day: 1.00    Years: 30.00    Pack years: 30.00    Types: Cigars, Cigarettes    Quit date: 07/11/1978    Years since quitting: 40.9  . Smokeless tobacco: Never Used  Substance and Sexual Activity  . Alcohol use: Not Currently    Alcohol/week: 0.0 standard drinks  . Drug use:  Never  . Sexual activity: Not Currently  Lifestyle  . Physical activity    Days per week: Not on file    Minutes per session: Not on file  . Stress: Not on file  Relationships  . Social Herbalist on phone: Not on file    Gets together: Not on file    Attends religious service: Not on file    Active member of club or organization: Not on file    Attends meetings of clubs or organizations: Not on file    Relationship status: Not on file  . Intimate partner violence    Fear of current or ex partner: Not on file    Emotionally abused: Not on file    Physically abused: Not on file    Forced sexual activity: Not on file  Other Topics Concern  . Not on file  Social History Narrative   Patient is married ([name]).   Patient has 3 children. 2 grandkids. 3 greatgrandkids.    Patient is retired Administrator.    Patient is right-handed.   Patient has a 10th grade education.   Patient drinks one cup of coffee daily.      Hobbies: working in the yard- gardening competition with neighbor on who can grow best vegetables, "messing around the house"      07/21/14- wants wife to make decisions for him. Not sure if he has advanced directives.          PHYSICAL EXAM  Vitals:   06/04/19 0832  BP: 132/72  Pulse: 69  Temp: (!) 97.3 F (36.3 C)  TempSrc: Oral  Weight: 168 lb (76.2 kg)  Height: 5\' 9"  (1.753 m)   Body mass index is 24.81 kg/m.   MMSE - Mini Mental State Exam 06/04/2019 11/27/2018 11/14/2017  Not completed: (No Data) - -  Orientation to time 4 5 5   Orientation to Place 4 5 5   Registration 3 3 3   Attention/ Calculation 1 1 4   Attention/Calculation-comments - - -  Recall 2 1 3   Language- name 2 objects 2 2 2   Language- repeat 1 1 1   Language- follow 3 step command 3 3 3   Language- read & follow direction 1 1 1   Write a sentence 0 0 0  Copy design 0 0 0  Total score 21 22 27      Generalized: Well developed, in no acute distress   Neurological  examination  Mentation: Alert oriented to time, place, history taking. Follows all commands speech and language fluent Cranial nerve II-XII: Pupils were equal round reactive to light. Extraocular movements were full, visual field were full on confrontational test.  Head turning and shoulder shrug  were normal and symmetric. Motor: The  motor testing reveals 5 over 5 strength of all 4 extremities. Good symmetric motor tone is noted throughout.  Sensory: Sensory testing is intact to soft touch on all 4 extremities. No evidence of extinction is noted.  Coordination: Cerebellar testing reveals good finger-nose-finger and heel-to-shin bilaterally.  Gait and station: Gait is normal.    DIAGNOSTIC DATA (LABS, IMAGING, TESTING) - I reviewed patient records, labs, notes, testing and imaging myself where available.  Lab Results  Component Value Date   WBC 5.5 04/01/2019   HGB 13.2 04/01/2019   HCT 39.2 04/01/2019   MCV 88.6 04/01/2019   PLT 278.0 04/01/2019      Component Value Date/Time   NA 139 04/01/2019 1121   K 4.6 04/01/2019 1121   CL 100 04/01/2019 1121   CO2 30 04/01/2019 1121   GLUCOSE 85 04/01/2019 1121   BUN 22 04/01/2019 1121   CREATININE 1.51 (H) 04/01/2019 1121   CALCIUM 10.2 04/01/2019 1121   PROT 7.7 04/01/2019 1121   ALBUMIN 4.3 04/01/2019 1121   AST 23 04/01/2019 1121   ALT 15 04/01/2019 1121   ALKPHOS 109 04/01/2019 1121   BILITOT 0.6 04/01/2019 1121   GFRNONAA 44 (L) 07/11/2018 0459   GFRAA 51 (L) 07/11/2018 0459   Lab Results  Component Value Date   CHOL 242 (H) 04/01/2019   HDL 52.20 04/01/2019   LDLCALC 93 09/24/2013   LDLDIRECT 100.0 04/01/2019   TRIG 229.0 (H) 04/01/2019   CHOLHDL 5 04/01/2019   Lab Results  Component Value Date   HGBA1C  02/23/2009    5.6 (NOTE) The ADA recommends the following therapeutic goal for glycemic control related to Hgb A1c measurement: Goal of therapy: <6.5 Hgb A1c  Reference: American Diabetes Association: Clinical  Practice Recommendations 2010, Diabetes Care, 2010, 33: (Suppl  1).   No results found for: VITAMINB12 Lab Results  Component Value Date   TSH 1.30 10/01/2012      ASSESSMENT AND PLAN 85 y.o. year old male  has a past medical history of Heart murmur, HYPERLIPIDEMIA (02/22/2007), HYPERTENSION (02/22/2007), MCI (mild cognitive impairment) with memory loss (05/14/2014), and Seizures ([place]). here with:  1.  Memory disturbance  -Memory score has remained stable MMSE 21 out of 30 -Continue Aricept 10 mg at bedtime  2.  Seizures  -Reports no seizure events -Continue on Keppra 500 mg twice a day  Overall the patient has remained stable.  He is advised that if his symptoms worsen or he develops new symptoms he should let us know.  He will follow-up in 1 year or sooner if needed.     Ward Givens, MSN, NP-C 06/04/2019, 8:53 AM [facility] [address], [place], [place] [address] [phone]

## 2019-06-04 NOTE — Patient Instructions (Signed)
Your Plan:  Continue Keppra 500 mg twice a day for seizures Continue Aricept 10 mg at bedtime for memory Memory score is stable 21 out of 30 If you have any seizure events please let us know.  Thank you for coming to see [place] at [facility]. I hope we have been able to provide you high quality care today.  You may receive a patient satisfaction survey over the next few weeks. We would appreciate your feedback and comments so that we may continue to improve ourselves and the health of our patients.

## 2019-07-15 ENCOUNTER — Other Ambulatory Visit: Payer: Self-pay

## 2019-07-15 ENCOUNTER — Ambulatory Visit (INDEPENDENT_AMBULATORY_CARE_PROVIDER_SITE_OTHER): Payer: Medicare HMO

## 2019-07-15 DIAGNOSIS — Z0000 Encounter for general adult medical examination without abnormal findings: Secondary | ICD-10-CM | POA: Diagnosis not present

## 2019-07-15 NOTE — Patient Instructions (Signed)
Mr. Isaiah Davidson , Thank you for taking time to come for your Medicare Wellness Visit. I appreciate your ongoing commitment to your health goals. Please review the following plan we discussed and let me know if I can assist you in the future.   Screening recommendations/referrals: Colorectal Screening: No longer indicated   Vision and Dental Exams: Recommended annual ophthalmology exams for early detection of glaucoma and other disorders of the eye Recommended annual dental exams for proper oral hygiene  Vaccinations: Influenza vaccine: completed 03/26/19 Pneumococcal vaccine: up to date; last 07/21/14 Tdap vaccine: up to date; last 01/28/10 Shingles vaccine: Please call your insurance company to determine your out of pocket expense for the Shingrix vaccine. You may receive this vaccine at your local pharmacy.  Advanced directives: Please bring a copy of your POA (Power of Attorney) and/or Living Will to your next appointment.  Goals: Recommend to drink at least 6-8 8oz glasses of water per day and consume a balanced diet rich in fresh fruits and vegetables.   Next appointment: Please schedule your Annual Wellness Visit with your Nurse Health Advisor in one year.  Preventive Care 43 Years and Older, Male Preventive care refers to lifestyle choices and visits with your health care provider that can promote health and wellness. What does preventive care include?  A yearly physical exam. This is also called an annual well check.  Dental exams once or twice a year.  Routine eye exams. Ask your health care provider how often you should have your eyes checked.  Personal lifestyle choices, including:  Daily care of your teeth and gums.  Regular physical activity.  Eating a healthy diet.  Avoiding tobacco and drug use.  Limiting alcohol use.  Practicing safe sex.  Taking low doses of aspirin every day if recommended by your health care provider..  Taking vitamin and mineral supplements  as recommended by your health care provider. What happens during an annual well check? The services and screenings done by your health care provider during your annual well check will depend on your age, overall health, lifestyle risk factors, and family history of disease. Counseling  Your health care provider may ask you questions about your:  Alcohol use.  Tobacco use.  Drug use.  Emotional well-being.  Home and relationship well-being.  Sexual activity.  Eating habits.  History of falls.  Memory and ability to understand (cognition).  Work and work Statistician. Screening  You may have the following tests or measurements:  Height, weight, and BMI.  Blood pressure.  Lipid and cholesterol levels. These may be checked every 5 years, or more frequently if you are over 4 years old.  Skin check.  Lung cancer screening. You may have this screening every year starting at age 18 if you have a 30-pack-year history of smoking and currently smoke or have quit within the past 15 years.  Fecal occult blood test (FOBT) of the stool. You may have this test every year starting at age 44.  Flexible sigmoidoscopy or colonoscopy. You may have a sigmoidoscopy every 5 years or a colonoscopy every 10 years starting at age 34.  Prostate cancer screening. Recommendations will vary depending on your family history and other risks.  Hepatitis C blood test.  Hepatitis B blood test.  Sexually transmitted disease (STD) testing.  Diabetes screening. This is done by checking your blood sugar (glucose) after you have not eaten for a while (fasting). You may have this done every 1-3 years.  Abdominal aortic aneurysm (AAA) screening.  You may need this if you are a current or former smoker.  Osteoporosis. You may be screened starting at age 64 if you are at high risk. Talk with your health care provider about your test results, treatment options, and if necessary, the need for more  tests. Vaccines  Your health care provider may recommend certain vaccines, such as:  Influenza vaccine. This is recommended every year.  Tetanus, diphtheria, and acellular pertussis (Tdap, Td) vaccine. You may need a Td booster every 10 years.  Zoster vaccine. You may need this after age 64.  Pneumococcal 13-valent conjugate (PCV13) vaccine. One dose is recommended after age 42.  Pneumococcal polysaccharide (PPSV23) vaccine. One dose is recommended after age 85. Talk to your health care provider about which screenings and vaccines you need and how often you need them. This information is not intended to replace advice given to you by your health care provider. Make sure you discuss any questions you have with your health care provider. Document Released: 07/24/2015 Document Revised: 03/16/2016 Document Reviewed: 04/28/2015 Elsevier Interactive Patient Education  2017 [place] Prevention in the Home Falls can cause injuries. They can happen to people of all ages. There are many things you can do to make your home safe and to help prevent falls. What can I do on the outside of my home?  Regularly fix the edges of walkways and driveways and fix any cracks.  Remove anything that might make you trip as you walk through a door, such as a raised step or threshold.  Trim any bushes or trees on the path to your home.  Use bright outdoor lighting.  Clear any walking paths of anything that might make someone trip, such as rocks or tools.  Regularly check to see if handrails are loose or broken. Make sure that both sides of any steps have handrails.  Any raised decks and porches should have guardrails on the edges.  Have any leaves, snow, or ice cleared regularly.  Use sand or salt on walking paths during winter.  Clean up any spills in your garage right away. This includes oil or grease spills. What can I do in the bathroom?  Use night lights.  Install grab bars by the  toilet and in the tub and shower. Do not use towel bars as grab bars.  Use non-skid mats or decals in the tub or shower.  If you need to sit down in the shower, use a plastic, non-slip stool.  Keep the floor dry. Clean up any water that spills on the floor as soon as it happens.  Remove soap buildup in the tub or shower regularly.  Attach bath mats securely with double-sided non-slip rug tape.  Do not have throw rugs and other things on the floor that can make you trip. What can I do in the bedroom?  Use night lights.  Make sure that you have a light by your bed that is easy to reach.  Do not use any sheets or blankets that are too big for your bed. They should not hang down onto the floor.  Have a firm chair that has side arms. You can use this for support while you get dressed.  Do not have throw rugs and other things on the floor that can make you trip. What can I do in the kitchen?  Clean up any spills right away.  Avoid walking on wet floors.  Keep items that you use a lot in easy-to-reach places.  If you need to reach something above you, use a strong step stool that has a grab bar.  Keep electrical cords out of the way.  Do not use floor polish or wax that makes floors slippery. If you must use wax, use non-skid floor wax.  Do not have throw rugs and other things on the floor that can make you trip. What can I do with my stairs?  Do not leave any items on the stairs.  Make sure that there are handrails on both sides of the stairs and use them. Fix handrails that are broken or loose. Make sure that handrails are as long as the stairways.  Check any carpeting to make sure that it is firmly attached to the stairs. Fix any carpet that is loose or worn.  Avoid having throw rugs at the top or bottom of the stairs. If you do have throw rugs, attach them to the floor with carpet tape.  Make sure that you have a light switch at the top of the stairs and the bottom of  the stairs. If you do not have them, ask someone to add them for you. What else can I do to help prevent falls?  Wear shoes that:  Do not have high heels.  Have rubber bottoms.  Are comfortable and fit you well.  Are closed at the toe. Do not wear sandals.  If you use a stepladder:  Make sure that it is fully opened. Do not climb a closed stepladder.  Make sure that both sides of the stepladder are locked into place.  Ask someone to hold it for you, if possible.  Clearly mark and make sure that you can see:  Any grab bars or handrails.  First and last steps.  Where the edge of each step is.  Use tools that help you move around (mobility aids) if they are needed. These include:  Canes.  Walkers.  Scooters.  Crutches.  Turn on the lights when you go into a dark area. Replace any light bulbs as soon as they burn out.  Set up your furniture so you have a clear path. Avoid moving your furniture around.  If any of your floors are uneven, fix them.  If there are any pets around you, be aware of where they are.  Review your medicines with your doctor. Some medicines can make you feel dizzy. This can increase your chance of falling. Ask your doctor what other things that you can do to help prevent falls. This information is not intended to replace advice given to you by your health care provider. Make sure you discuss any questions you have with your health care provider. Document Released: 04/23/2009 Document Revised: 12/03/2015 Document Reviewed: 08/01/2014 Elsevier Interactive Patient Education  2017 [employer].

## 2019-07-15 NOTE — Progress Notes (Signed)
This visit is being conducted via phone call due to the COVID-19 pandemic. This patient has given me verbal consent via phone to conduct this visit, patient states they are participating from their home address. Some vital signs may be absent or patient reported.   Patient identification: identified by name, DOB, and current address.  Location provider: [facility], Office Persons participating in the virtual visit: Denman George LPN, patient, and Dr. [name]     Subjective:   Isaiah Davidson is a 86 y.o. male who presents for Medicare Annual/Subsequent preventive examination.  Review of Systems:   Cardiac Risk Factors include: advanced age (>39men, >10 women);male gender;hypertension;dyslipidemia    Objective:    Vitals: There were no vitals taken for this visit.  There is no height or weight on file to calculate BMI.  Advanced Directives 07/15/2019 07/09/2018 06/17/2018 11/03/2016  Does Patient Have a Medical Advance Directive? Yes Yes Yes Yes  Type of Advance Directive Living will Living will Living will -  Does patient want to make changes to medical advance directive? No - Patient declined No - Patient declined No - Patient declined -  Would patient like information on creating a medical advance directive? - - No - Patient declined -    Tobacco Social History   Tobacco Use  Smoking Status Former Smoker  . Packs/day: 1.00  . Years: 30.00  . Pack years: 30.00  . Types: Cigars, Cigarettes  . Quit date: 07/11/1978  . Years since quitting: 41.0  Smokeless Tobacco Never Used     Counseling given: Not Answered   Clinical Intake:  Pre-visit preparation completed: Yes  Pain : No/denies pain  Diabetes: No  How often do you need to have someone help you when you read instructions, pamphlets, or other written materials from your doctor or pharmacy?: 1 - Never  Interpreter Needed?: No  Information entered by :: Denman George LPN  Past Medical History:  Diagnosis  Date  . Heart murmur   . HYPERLIPIDEMIA 02/22/2007  . HYPERTENSION 02/22/2007  . MCI (mild cognitive impairment) with memory loss 05/14/2014  . Seizures ([place])    treated for siezures around 2010   Past Surgical History:  Procedure Laterality Date  . CARPAL TUNNEL RELEASE Bilateral   . CHOLECYSTECTOMY N/A 06/15/2018   Procedure: LAPAROSCOPIC CHOLECYSTECTOMY;  Surgeon: Kinsinger, Arta Bruce, MD;  Location: [facility];  Service: General;  Laterality: N/A;  . KNEE ARTHROSCOPY Right    Family History  Problem Relation Age of Onset  . Diabetes Paternal Grandmother        raised by grandparents   Social History   Socioeconomic History  . Marital status: Married    Spouse name: [name]  . Number of children: 2  . Years of education: 10  . Highest education level: Not on file  Occupational History    Employer: RETIRED  Tobacco Use  . Smoking status: Former Smoker    Packs/day: 1.00    Years: 30.00    Pack years: 30.00    Types: Cigars, Cigarettes    Quit date: 07/11/1978    Years since quitting: 41.0  . Smokeless tobacco: Never Used  Substance and Sexual Activity  . Alcohol use: Not Currently    Alcohol/week: 0.0 standard drinks  . Drug use: Never  . Sexual activity: Not Currently  Other Topics Concern  . Not on file  Social History Narrative   Patient is married ([name]).   Patient has 3 children. 2 grandkids. 3 greatgrandkids.  Patient is retired Administrator.    Patient is right-handed.   Patient has a 10th grade education.   Patient drinks one cup of coffee daily.      Hobbies: working in the yard- gardening competition with neighbor on who can grow best vegetables, "messing around the house"      07/21/14- wants wife to make decisions for him. Not sure if he has advanced directives.       Social Determinants of Health   Financial Resource Strain:   . Difficulty of Paying Living Expenses: Not on file  Food Insecurity:   . Worried About Charity fundraiser in the  Last Year: Not on file  . Ran Out of Food in the Last Year: Not on file  Transportation Needs:   . Lack of Transportation (Medical): Not on file  . Lack of Transportation (Non-Medical): Not on file  Physical Activity:   . Days of Exercise per Week: Not on file  . Minutes of Exercise per Session: Not on file  Stress:   . Feeling of Stress : Not on file  Social Connections:   . Frequency of Communication with Friends and Family: Not on file  . Frequency of Social Gatherings with Friends and Family: Not on file  . Attends Religious Services: Not on file  . Active Member of Clubs or Organizations: Not on file  . Attends Archivist Meetings: Not on file  . Marital Status: Not on file    Outpatient Encounter Medications as of 07/15/2019  Medication Sig  . allopurinol (ZYLOPRIM) 100 MG tablet Take 1 tablet (100 mg total) by mouth daily.  [name] Kitchen amLODipine (NORVASC) 10 MG tablet Take 1 tablet (10 mg total) by mouth daily.  [name] Kitchen atorvastatin (LIPITOR) 20 MG tablet Take 1 tablet (20 mg total) by mouth daily. Change in prescription. Stop simvastatin. Start atorvastatin.  . chlorthalidone (HYGROTON) 25 MG tablet Take 1 tablet (25 mg total) by mouth daily.  [name] Kitchen donepezil (ARICEPT) 10 MG tablet Take 1 tablet (10 mg total) by mouth at bedtime.  . levETIRAcetam (KEPPRA) 500 MG tablet Take 1 tablet (500 mg total) by mouth 2 (two) times daily.  . Multiple Vitamin (MULTIVITAMIN) capsule Take 1 capsule by mouth daily.     No facility-administered encounter medications on file as of 07/15/2019.    Activities of Daily Living In your present state of health, do you have any difficulty performing the following activities: 07/15/2019  Hearing? N  Vision? N  Difficulty concentrating or making decisions? Y  Comment followed by neurology  Walking or climbing stairs? N  Dressing or bathing? N  Doing errands, shopping? N  Preparing Food and eating ? N  Using the Toilet? N  In the past six months, have you  accidently leaked urine? N  Do you have problems with loss of bowel control? N  Managing your Medications? N  Managing your Finances? N  Comment assisted by spouse  Housekeeping or managing your Housekeeping? N  Some recent data might be hidden    Patient Care Team: Marin Olp, MD as PCP - General (Family Medicine) Ward Givens, NP as Registered Nurse (Neurology) Madilyn Hook, [place] as Consulting Physician (Optometry)   Assessment:   This is a routine wellness examination for Isaiah Davidson.  Exercise Activities and Dietary recommendations Current Exercise Habits: The patient does not participate in regular exercise at present  Goals    . patient     Would like to have to part time  job;  If not; will do a little more yard work        [place]  07/15/2019 04/01/2019 01/18/2018 05/16/2017 11/03/2016  Falls in the past year? 0 0 No No No  Number falls in past yr: - 0 - - -  Injury with Fall? 0 0 - - -  Follow up Falls evaluation completed;Education provided;Falls prevention discussed - - - -   Is the patient's home free of loose throw rugs in walkways, pet beds, electrical cords, etc?   yes      Grab bars in the bathroom? yes      Handrails on the stairs?   yes      Adequate lighting?   yes   Depression Screen PHQ 2/9 Scores 07/15/2019 04/01/2019 11/28/2018 07/23/2018  PHQ - 2 Score 0 0 0 0  PHQ- 9 Score - - 0 -    Cognitive Function- followed by neurology; currently on medication  MMSE - Mini Mental State Exam 06/04/2019 11/27/2018 11/14/2017 05/16/2017 11/08/2016  Not completed: (No Data) - - - -  Orientation to time 4 5 5 5 4   Orientation to Place 4 5 5 5 5   Registration 3 3 3 3 3   Attention/ Calculation 1 1 4 1 1   Attention/Calculation-comments - - - - counted back by 10s even though he was reminded to subtract 7  Recall 2 1 3 2 2   Language- name 2 objects 2 2 2 2 2   Language- repeat 1 1 1 1 1   Language- follow 3 step command 3 3 3 3 3   Language- read & follow  direction 1 1 1 1 1   Write a sentence 0 0 0 1 0  Copy design 0 0 0 0 0  Total score 21 22 27 24 22    Montreal Cognitive Assessment  05/14/2015 05/14/2014  Visuospatial/ Executive (0/5) 3 4  Naming (0/3) 2 3  Attention: Read list of digits (0/2) 2 1  Attention: Read list of letters (0/1) 1 1  Attention: Serial 7 subtraction starting at 100 (0/3) 1 2  Language: Repeat phrase (0/2) 1 2  Language : Fluency (0/1) 1 0  Abstraction (0/2) 1 2  Delayed Recall (0/5) 0 2  Orientation (0/6) 5 6  Total 17 23  Adjusted Score (based on education) 18 23      Immunization History  Administered Date(s) Administered  . Influenza Split 04/19/2011, 04/03/2012  . Influenza Whole 06/05/2007, 05/20/2009  . Influenza, High Dose Seasonal PF 05/24/2017  . Influenza,inj,Quad PF,6+ Mos 04/01/2013, 05/26/2015  . Influenza-Unspecified 04/19/2014, 04/27/2016, 04/10/2018, 03/26/2019  . Pneumococcal Conjugate-13 07/21/2014  . Pneumococcal Polysaccharide-23 06/18/2002  . Td 01/28/2010    Qualifies for Shingles Vaccine?Discussed and patient will check with pharmacy for coverage.  Patient education handout provided   Screening Tests Health Maintenance  Topic Date Due  . TETANUS/TDAP  01/29/2020  . INFLUENZA VACCINE  Completed  . PNA vac Low Risk Adult  Completed   Cancer Screenings: Lung: Low Dose CT Chest recommended if Age 80-80 years, 30 pack-year currently smoking OR have quit w/in 15years. Patient does not qualify. Colorectal: No longer indicated      Plan:  I have personally reviewed and addressed the Medicare Annual Wellness questionnaire and have noted the following in the patient's chart:  A. Medical and social history B. Use of alcohol, tobacco or illicit drugs  C. Current medications and supplements D. Functional ability and status E.  Nutritional status F.  Physical activity  G. Advance directives H. List of other physicians I.  Hospitalizations, surgeries, and ER visits in previous 12  months J.  [place] such as hearing and vision if needed, cognitive and depression L. Referrals, records requested, and appointments- none   In addition, I have reviewed and discussed with patient certain preventive protocols, quality metrics, and best practice recommendations. A written personalized care plan for preventive services as well as general preventive health recommendations were provided to patient.   Signed,  Denman George, LPN  Nurse Health Advisor   Nurse Notes: no additional

## 2019-07-25 ENCOUNTER — Encounter: Payer: Medicare HMO | Admitting: Family Medicine

## 2019-10-18 ENCOUNTER — Other Ambulatory Visit: Payer: Self-pay

## 2019-10-18 MED ORDER — ALLOPURINOL 100 MG PO TABS: 100.0000 mg | ORAL_TABLET | Freq: Every day | ORAL | 3 refills | Status: AC

## 2019-10-18 MED ORDER — AMLODIPINE BESYLATE 10 MG PO TABS: 10.0000 mg | ORAL_TABLET | Freq: Every day | ORAL | 3 refills | Status: AC

## 2019-10-18 MED ORDER — ATORVASTATIN CALCIUM 20 MG PO TABS: 20.0000 mg | ORAL_TABLET | Freq: Every day | ORAL | 3 refills | Status: DC

## 2019-10-18 MED ORDER — CHLORTHALIDONE 25 MG PO TABS: 25.0000 mg | ORAL_TABLET | Freq: Every day | ORAL | 3 refills | Status: AC

## 2019-11-04 ENCOUNTER — Other Ambulatory Visit: Payer: Self-pay | Admitting: *Deleted

## 2019-11-04 MED ORDER — DONEPEZIL HCL 10 MG PO TABS: 10.0000 mg | ORAL_TABLET | Freq: Every day | ORAL | 3 refills | Status: DC

## 2019-11-19 ENCOUNTER — Encounter: Payer: Self-pay | Admitting: Family Medicine

## 2019-11-19 ENCOUNTER — Ambulatory Visit (INDEPENDENT_AMBULATORY_CARE_PROVIDER_SITE_OTHER): Payer: Medicare HMO | Admitting: Family Medicine

## 2019-11-19 ENCOUNTER — Other Ambulatory Visit: Payer: Self-pay | Admitting: *Deleted

## 2019-11-19 ENCOUNTER — Other Ambulatory Visit: Payer: Self-pay

## 2019-11-19 VITALS — BP 130/80 | HR 69 | Temp 98.1°F | Ht 68.5 in | Wt 166.5 lb

## 2019-11-19 DIAGNOSIS — E785 Hyperlipidemia, unspecified: Secondary | ICD-10-CM

## 2019-11-19 DIAGNOSIS — G40909 Epilepsy, unspecified, not intractable, without status epilepticus: Secondary | ICD-10-CM | POA: Diagnosis not present

## 2019-11-19 DIAGNOSIS — I10 Essential (primary) hypertension: Secondary | ICD-10-CM | POA: Diagnosis not present

## 2019-11-19 DIAGNOSIS — Z0000 Encounter for general adult medical examination without abnormal findings: Secondary | ICD-10-CM | POA: Diagnosis not present

## 2019-11-19 DIAGNOSIS — G3184 Mild cognitive impairment, so stated: Secondary | ICD-10-CM | POA: Diagnosis not present

## 2019-11-19 DIAGNOSIS — I700 Atherosclerosis of aorta: Secondary | ICD-10-CM | POA: Diagnosis not present

## 2019-11-19 DIAGNOSIS — M119 Crystal arthropathy, unspecified: Secondary | ICD-10-CM | POA: Diagnosis not present

## 2019-11-19 LAB — CBC WITH DIFFERENTIAL/PLATELET
Basophils Absolute: 0 10*3/uL (ref 0.0–0.1)
Basophils Relative: 0.6 % (ref 0.0–3.0)
Eosinophils Absolute: 0.1 10*3/uL (ref 0.0–0.7)
Eosinophils Relative: 1.5 % (ref 0.0–5.0)
HCT: 38.1 % — ABNORMAL LOW (ref 39.0–52.0)
Hemoglobin: 13 g/dL (ref 13.0–17.0)
Lymphocytes Relative: 29.9 % (ref 12.0–46.0)
Lymphs Abs: 1.6 10*3/uL (ref 0.7–4.0)
MCHC: 34.2 g/dL (ref 30.0–36.0)
MCV: 90.7 fl (ref 78.0–100.0)
Monocytes Absolute: 0.6 10*3/uL (ref 0.1–1.0)
Monocytes Relative: 11.8 % (ref 3.0–12.0)
Neutro Abs: 3.1 10*3/uL (ref 1.4–7.7)
Neutrophils Relative %: 56.2 % (ref 43.0–77.0)
Platelets: 257 10*3/uL (ref 150.0–400.0)
RBC: 4.2 Mil/uL — ABNORMAL LOW (ref 4.22–5.81)
RDW: 13.7 % (ref 11.5–15.5)
WBC: 5.5 10*3/uL (ref 4.0–10.5)

## 2019-11-19 LAB — COMPREHENSIVE METABOLIC PANEL
ALT: 23 U/L (ref 0–53)
AST: 26 U/L (ref 0–37)
Albumin: 4.3 g/dL (ref 3.5–5.2)
Alkaline Phosphatase: 114 U/L (ref 39–117)
BUN: 20 mg/dL (ref 6–23)
CO2: 30 mEq/L (ref 19–32)
Calcium: 9.7 mg/dL (ref 8.4–10.5)
Chloride: 99 mEq/L (ref 96–112)
Creatinine, Ser: 1.56 mg/dL — ABNORMAL HIGH (ref 0.40–1.50)
GFR: 51.32 mL/min — ABNORMAL LOW (ref >60.00)
Glucose, Bld: 96 mg/dL (ref 70–99)
Potassium: 4 mEq/L (ref 3.5–5.1)
Sodium: 136 mEq/L (ref 135–145)
Total Bilirubin: 0.7 mg/dL (ref 0.2–1.2)
Total Protein: 7.7 g/dL (ref 6.0–8.3)

## 2019-11-19 LAB — LIPID PANEL
Cholesterol: 211 mg/dL — ABNORMAL HIGH (ref 0–200)
HDL: 55 mg/dL (ref >39.00)
LDL Cholesterol: 122 mg/dL — ABNORMAL HIGH (ref 0–99)
NonHDL: 156.43
Total CHOL/HDL Ratio: 4
Triglycerides: 172 mg/dL — ABNORMAL HIGH (ref 0.0–149.0)
VLDL: 34.4 mg/dL (ref 0.0–40.0)

## 2019-11-19 MED ORDER — LEVETIRACETAM 500 MG PO TABS: 500.0000 mg | ORAL_TABLET | Freq: Two times a day (BID) | ORAL | 3 refills | Status: DC

## 2019-11-19 NOTE — Progress Notes (Signed)
Phone: [phone]     I,Isaiah Davidson,acting as a scribe for [name], MD.,have documented all relevant documentation on the behalf of [name], MD,as directed by  [name], MD while in the presence of [name], MD.  Subjective:  Patient presents today for their annual physical. Chief complaint-noted.   See problem oriented charting- Review of Systems  - full  review of systems was completed and negative  except for: stable memory loss  The following were reviewed and entered/updated in epic: Past Medical History:  Diagnosis Date  . Heart murmur   . HYPERLIPIDEMIA 02/22/2007  . HYPERTENSION 02/22/2007  . MCI (mild cognitive impairment) with memory loss 05/14/2014  . Seizures ([place])    treated for siezures around 2010   Patient Active Problem List   Diagnosis Date Noted  . MCI (mild cognitive impairment) with memory loss 05/14/2014    Priority: High  . Seizure disorder ([place]) 04/19/2011    Priority: High  . Aortic atherosclerosis ([place]) 08/05/2016    Priority: Medium  . 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 Hyperlipidemia 02/22/2007    Priority: Medium  . Essential hypertension 02/22/2007    Priority: Medium  . Status post cholecystectomy 06/15/2018    Priority: Low  . Lipoma 02/02/2017    Priority: Low  . Finger infection 06/11/2014    Priority: Low  . Shortness of breath 10/12/2012    Priority: Low  . Carpal tunnel syndrome 08/18/2010    Priority: Low  . Cyst and pseudocyst of pancreas 04/12/2010    Priority: Low  . Diarrhea 07/09/2018   Past Surgical History:  Procedure Laterality Date  . CARPAL TUNNEL RELEASE Bilateral   . CHOLECYSTECTOMY N/A 06/15/2018   Procedure: LAPAROSCOPIC CHOLECYSTECTOMY;  Surgeon: Kinsinger, Arta Bruce, MD;  Location: [facility];  Service: General;  Laterality: N/A;  . KNEE ARTHROSCOPY Right     Family History  Problem Relation Age of Onset  . Diabetes Paternal Grandmother        raised by  grandparents    Medications- reviewed and updated Current Outpatient Medications  Medication Sig Dispense Refill  . allopurinol (ZYLOPRIM) 100 MG tablet Take 1 tablet (100 mg total) by mouth daily. 90 tablet 3  . amLODipine (NORVASC) 10 MG tablet Take 1 tablet (10 mg total) by mouth daily. 90 tablet 3  . atorvastatin (LIPITOR) 20 MG tablet Take 1 tablet (20 mg total) by mouth daily. Change in prescription. Stop simvastatin. Start atorvastatin. 90 tablet 3  . chlorthalidone (HYGROTON) 25 MG tablet Take 1 tablet (25 mg total) by mouth daily. 90 tablet 3  . donepezil (ARICEPT) 10 MG tablet Take 1 tablet (10 mg total) by mouth at bedtime. 90 tablet 3  . levETIRAcetam (KEPPRA) 500 MG tablet Take 1 tablet (500 mg total) by mouth 2 (two) times daily. 180 tablet 3  . Multiple Vitamin (MULTIVITAMIN) capsule Take 1 capsule by mouth daily.       No current facility-administered medications for this visit.    Allergies-reviewed and updated No Known Allergies  Social History   Social History Narrative   Patient is married ([name]).   Patient has 3 children. 2 grandkids. 3 greatgrandkids.    Patient is retired Administrator.    Patient is right-handed.   Patient has a 10th grade education.   Patient drinks one cup of coffee daily.      Hobbies: working in the yard- gardening competition with neighbor on who can grow best vegetables, "messing around  the house"      07/21/14- wants wife to make decisions for him. Not sure if he has advanced directives.       Objective  Objective:  BP 130/80 (BP Location: Left Arm, Patient Position: Sitting, Cuff Size: Normal)   Pulse 69   Temp 98.1 F (36.7 C) (Temporal)   Ht 5' 8.5" (1.74 m)   Wt 166 lb 8 oz (75.5 kg)   SpO2 96%   BMI 24.95 kg/m  Gen: NAD, resting comfortably HEENT: Mucous membranes are moist. Oropharynx normal. Dentures in place Neck: no thyromegaly, no carotid bruit CV: RRR no murmurs rubs or gallops Lungs: CTAB no crackles,  wheeze, rhonchi Abdomen: soft/nontender/nondistended/normal bowel sounds. No rebound or guarding.  Ext: no edema Skin: warm, dry Neuro: grossly normal, moves all extremities, PERRLA   Assessment and Plan  86 y.o. male presenting for annual physical.  Health Maintenance counseling: 1. Anticipatory guidance: Patient counseled regarding regular dental exams - wears dentures, eye exams -yearly,  avoiding smoking and second hand smoke, limiting alcohol to 2 beverages per day - doesn't drink at all.   2. Risk factor reduction:  Advised patient of need for regular exercise and diet rich and fruits and vegetables to reduce risk of heart attack and stroke. Exercise- Trying to walk a few blocks and remain active in yard. Diet-eating reasonably healthy-maintaining weight.   Wt Readings from Last 3 Encounters:  11/19/19 166 lb 8 oz (75.5 kg)  06/04/19 168 lb (76.2 kg)  04/01/19 163 lb 9.6 oz (74.2 kg)  3. Immunizations/screenings/ancillary studies- consider shingrix at pharmacy Immunization History  Administered Date(s) Administered  . Influenza Split 04/19/2011, 04/03/2012  . Influenza Whole 06/05/2007, 05/20/2009  . Influenza, High Dose Seasonal PF 05/24/2017  . Influenza,inj,Quad PF,6+ Mos 04/01/2013, 05/26/2015  . Influenza-Unspecified 04/19/2014, 04/27/2016, 04/10/2018, 03/26/2019  . PFIZER SARS-COV-2 Vaccination 07/31/2019, 08/21/2019  . Pneumococcal Conjugate-13 07/21/2014  . Pneumococcal Polysaccharide-23 06/18/2002  . Td 01/28/2010  4. Prostate cancer screening- past age based screening recommendations. No big change in urinary symptoms Lab Results  Component Value Date   PSA historical 12/09/2001   5. Colon cancer screening - past age based screening recommendations.  No rectal bleeding 6. Skin cancer screening-low risk due to melanin content.advised regular sunscreen use. Denies worrisome, changing, or new skin lesions.  7. Former smoker- quit in 1980s. No regular screening  recommended   Status of chronic or acute concerns   #MCI (mild cognitive impairment) with memory loss -No recurrence of seizures on Keppra-continue current medication  #Seizure disorder (HCC)-follows with neurology with next visit already scheduled-scheduled with Vaughan Browner, NP -Follows with neurology for memory loss.  Continue Aricept  #hyperlipidemia #Aortic atherosclerosis-incidental finding on imaging S: Medication:Atorvastatin 20 mg Lab Results  Component Value Date   CHOL 242 (H) 04/01/2019   HDL 52.20 04/01/2019   LDLCALC 93 09/24/2013   LDLDIRECT 100.0 04/01/2019   TRIG 229.0 (H) 04/01/2019   CHOLHDL 5 04/01/2019   A/P: For lipids-we will update lipid panel.  If LDL above 70 consider increase to rosuvastatin 20 mg  For aortic atherosclerosis-continue risk factor modification including statin  #hypertension S: medication: Amlodipine 10 mg, chlorthalidone 25 mg Home readings #s: reports wife has been checking and says normal BP Readings from Last 3 Encounters:  11/19/19 130/80  06/04/19 132/72  04/01/19 130/72  A/P: Stable. Continue current medications.   #Gout/wrist arthropathy S: 0 flares in 6 months on allopurinol 100 mg Lab Results  Component Value Date   LABURIC 6.4  04/01/2019   A/P:Uric acid slightly above goal but he has not been having gout flares-we will continue with current prescription  Recommended follow up: Return in about 6 months (around 05/21/2020) for follow up- or sooner if needed. Future Appointments  Date Time Provider [place]  06/08/2020  9:00 AM Ward Givens, NP [facility] None   Lab/Order associations:  fasting   ICD-10-CM   1. Preventative health care  Z00.00 CBC with Differential/Platelet    Comprehensive metabolic panel    Lipid panel  2. Essential hypertension  I10   3. Hyperlipidemia, unspecified hyperlipidemia type  E78.5 CBC with Differential/Platelet    Comprehensive metabolic panel    Lipid panel  4. Aortic  atherosclerosis (HCC)  I70.0   5. Crystal arthropathy  M11.9   6. MCI (mild cognitive impairment) with memory loss  G31.84   7. Seizure disorder ([place])  G40.909     No orders of the defined types were placed in this encounter.  The entirety of the information documented in the History of Present Illness, Review of Systems and Physical Exam were personally obtained by me. Portions of this information were initially documented by the CMA and reviewed by me for thoroughness and accuracy.   [name], MD   Return precautions advised.  [name], MD

## 2019-11-19 NOTE — Patient Instructions (Addendum)
Please stop by lab before you go If you have mychart- we will send your results within 3 business days of [place] receiving them.  If you do not have mychart- we will call you about results within 5 business days of [place] receiving them.    Consider Please check with your pharmacy to see if they have the shingrix vaccine. If they do- please get this immunization and update [place] by phone call or mychart with dates you receive the vaccine  Also due for Tetanus shot at July- once again best to get this at pharmacy as its cheaper.

## 2019-11-20 ENCOUNTER — Other Ambulatory Visit: Payer: Self-pay

## 2019-11-20 MED ORDER — ROSUVASTATIN CALCIUM 20 MG PO TABS: 20.0000 mg | ORAL_TABLET | Freq: Every day | ORAL | 3 refills | Status: AC

## 2020-01-06 ENCOUNTER — Encounter (HOSPITAL_COMMUNITY): Payer: Self-pay

## 2020-01-06 ENCOUNTER — Other Ambulatory Visit: Payer: Self-pay

## 2020-01-06 ENCOUNTER — Telehealth: Payer: Self-pay | Admitting: Family Medicine

## 2020-01-06 ENCOUNTER — Ambulatory Visit (HOSPITAL_COMMUNITY)
Admission: EM | Admit: 2020-01-06 | Discharge: 2020-01-06 | Disposition: A | Discharge status: Home / Self Care | Payer: Medicare HMO | Attending: Internal Medicine | Admitting: Internal Medicine

## 2020-01-06 ENCOUNTER — Ambulatory Visit (HOSPITAL_COMMUNITY)
Admission: EM | Admit: 2020-01-06 | Discharge: 2020-01-06 | Disposition: A | Discharge status: Home / Self Care | Payer: Self-pay

## 2020-01-06 DIAGNOSIS — R1033 Periumbilical pain: Secondary | ICD-10-CM | POA: Diagnosis not present

## 2020-01-06 DIAGNOSIS — R630 Anorexia: Secondary | ICD-10-CM | POA: Diagnosis not present

## 2020-01-06 LAB — CBC WITH DIFFERENTIAL/PLATELET
Abs Immature Granulocytes: 0.01 10*3/uL (ref 0.00–0.07)
Basophils Absolute: 0 10*3/uL (ref 0.0–0.1)
Basophils Relative: 0 %
Eosinophils Absolute: 0 10*3/uL (ref 0.0–0.5)
Eosinophils Relative: 0 %
HCT: 37.8 % — ABNORMAL LOW (ref 39.0–52.0)
Hemoglobin: 12.8 g/dL — ABNORMAL LOW (ref 13.0–17.0)
Immature Granulocytes: 0 %
Lymphocytes Relative: 26 %
Lymphs Abs: 1.4 10*3/uL (ref 0.7–4.0)
MCH: 30.3 pg (ref 26.0–34.0)
MCHC: 33.9 g/dL (ref 30.0–36.0)
MCV: 89.4 fL (ref 80.0–100.0)
Monocytes Absolute: 0.7 10*3/uL (ref 0.1–1.0)
Monocytes Relative: 13 %
Neutro Abs: 3.1 10*3/uL (ref 1.7–7.7)
Neutrophils Relative %: 61 %
Platelets: 238 10*3/uL (ref 150–400)
RBC: 4.23 MIL/uL (ref 4.22–5.81)
RDW: 12.6 % (ref 11.5–15.5)
WBC: 5.2 10*3/uL (ref 4.0–10.5)
nRBC: 0 % (ref 0.0–0.2)

## 2020-01-06 LAB — COMPREHENSIVE METABOLIC PANEL
ALT: 28 U/L (ref 0–44)
AST: 30 U/L (ref 15–41)
Albumin: 3.6 g/dL (ref 3.5–5.0)
Alkaline Phosphatase: 95 U/L (ref 38–126)
Anion gap: 11 (ref 5–15)
BUN: 18 mg/dL (ref 8–23)
CO2: 26 mmol/L (ref 22–32)
Calcium: 9.3 mg/dL (ref 8.9–10.3)
Chloride: 94 mmol/L — ABNORMAL LOW (ref 98–111)
Creatinine, Ser: 1.56 mg/dL — ABNORMAL HIGH (ref 0.61–1.24)
GFR calc Af Amer: 46 mL/min — ABNORMAL LOW (ref >60)
GFR calc non Af Amer: 40 mL/min — ABNORMAL LOW (ref >60)
Glucose, Bld: 97 mg/dL (ref 70–99)
Potassium: 3.6 mmol/L (ref 3.5–5.1)
Sodium: 131 mmol/L — ABNORMAL LOW (ref 135–145)
Total Bilirubin: 1 mg/dL (ref 0.3–1.2)
Total Protein: 7.8 g/dL (ref 6.5–8.1)

## 2020-01-06 MED ORDER — PANTOPRAZOLE SODIUM 20 MG PO TBEC: 20.0000 mg | DELAYED_RELEASE_TABLET | Freq: Every day | ORAL | 0 refills | Status: DC

## 2020-01-06 MED ORDER — PANTOPRAZOLE SODIUM 20 MG PO TBEC: 20.0000 mg | DELAYED_RELEASE_TABLET | Freq: Every day | ORAL | Status: DC

## 2020-01-06 NOTE — ED Provider Notes (Signed)
[place]    CSN: [id number] Arrival date & time: 01/06/20  1008      History   Chief Complaint Chief Complaint  Patient presents with   Abdominal Pain    HPI Isaiah Davidson is a 86 y.o. male comes to urgent care with complaints of loss of appetite over the past several days.  Patient has not eating any substantial food over the past 3 days.  He says that he experiences abdominal pain anytime he is hungry and when he eats something it seems to help with the abdominal pain.  No weight loss.  No vomiting.  No change in bowel movements.  No abdominal distention but he has a sense of abdominal fullness.  No fever or chills.  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   Finger infection 06/11/2014   MCI (mild cognitive impairment) with memory loss 05/14/2014   Shortness of breath 10/12/2012   Seizure disorder ([place]) 04/19/2011   Carpal tunnel syndrome 08/18/2010   Cyst and pseudocyst of pancreas 04/12/2010   Hyperlipidemia 02/22/2007   Essential hypertension 02/22/2007    Past Surgical History:  Procedure Laterality Date   CARPAL TUNNEL RELEASE Bilateral    CHOLECYSTECTOMY N/A 06/15/2018   Procedure: LAPAROSCOPIC CHOLECYSTECTOMY;  Surgeon: Mickeal Skinner, MD;  Location: [facility];  Service: General;  Laterality: N/A;   KNEE ARTHROSCOPY Right        Home Medications    Prior to Admission medications   Medication Sig Start Date End Date Taking? Authorizing Provider  allopurinol (ZYLOPRIM) 100 MG tablet Take 1  tablet (100 mg total) by mouth daily. 10/18/19   Marin Olp, MD  amLODipine (NORVASC) 10 MG tablet Take 1 tablet (10 mg total) by mouth daily. 10/18/19   Marin Olp, MD  chlorthalidone (HYGROTON) 25 MG tablet Take 1 tablet (25 mg total) by mouth daily. 10/18/19   Marin Olp, MD  donepezil (ARICEPT) 10 MG tablet Take 1 tablet (10 mg total) by mouth at bedtime. 11/04/19   Ward Givens, NP  levETIRAcetam (KEPPRA) 500 MG tablet Take 1 tablet (500 mg total) by mouth 2 (two) times daily. 11/19/19   Marin Olp, MD  Multiple Vitamin (MULTIVITAMIN) capsule Take 1 capsule by mouth daily.      [provider]  pantoprazole (PROTONIX) 20 MG tablet Take 1 tablet (20 mg total) by mouth daily. 01/06/20   Shanell Aden, Myrene Galas, MD  rosuvastatin (CRESTOR) 20 MG tablet Take 1 tablet (20 mg total) by mouth daily. 11/20/19   Marin Olp, MD    Family History Family History  Problem Relation Age of Onset   Diabetes Paternal Grandmother        raised by grandparents    Social History Social History   Tobacco Use   Smoking status: Former Smoker    Packs/day: 1.00    Years: 30.00    Pack years: 30.00    Types: Cigars, Cigarettes    Quit date: 07/11/1978    Years since quitting: 17.5  Smokeless tobacco: Never Used  Vaping Use   Vaping Use: Never used  Substance Use Topics   Alcohol use: Not Currently    Alcohol/week: 0.0 standard drinks   Drug use: Never     Allergies   Patient has no known allergies.   Review of Systems Review of Systems  Constitutional: Negative for activity change, fatigue and fever.  Respiratory: Negative.   Cardiovascular: Negative.   Gastrointestinal: Positive for abdominal pain. Negative for abdominal distention, diarrhea, nausea and vomiting.  Genitourinary: Negative.   Neurological: Negative.      Physical Exam Triage Vital Signs ED Triage Vitals  Enc Vitals Group     BP 01/06/20 1129 (!) 145/84     Pulse Rate 01/06/20  1129 64     Resp 01/06/20 1129 (!) 21     Temp 01/06/20 1129 99.4 F (37.4 C)     Temp Source 01/06/20 1129 Oral     SpO2 01/06/20 1129 100 %     Weight --      Height --      Head Circumference --      Peak Flow --      Pain Score 01/06/20 1138 6     Pain Loc --      Pain Edu? --      Excl. in [place]? --    No data found.  Updated Vital Signs BP (!) 145/84    Pulse 64    Temp 99.4 F (37.4 C) (Oral)    Resp (!) 21    SpO2 100%   Visual Acuity Right Eye Distance:   Left Eye Distance:   Bilateral Distance:    Right Eye Near:   Left Eye Near:    Bilateral Near:     Physical Exam Vitals and nursing note reviewed.  Cardiovascular:     Rate and Rhythm: Normal rate and regular rhythm.  Pulmonary:     Effort: Pulmonary effort is normal.     Breath sounds: Normal breath sounds.  Abdominal:     General: Abdomen is flat. Bowel sounds are normal.     Palpations: Abdomen is soft.     Tenderness: There is no abdominal tenderness.     Hernia: No hernia is present.  Neurological:     Mental Status: He is alert.      UC Treatments / Results  Labs (all labs ordered are listed, but only abnormal results are displayed) Labs Reviewed  CBC WITH DIFFERENTIAL/PLATELET  COMPREHENSIVE METABOLIC PANEL    EKG   Radiology No results found.  Procedures Procedures (including critical care time)  Medications Ordered in UC Medications - No data to display  Initial Impression / Assessment and Plan / UC Course  I have reviewed the triage vital signs and the nursing notes.  Pertinent labs & imaging results that were available during my care of the patient were reviewed by me and considered in my medical decision making (see chart for details).     1.  Abdominal pain with loss of appetite: CBC, CMP Protonix 20 mg orally daily If patient symptoms worsens he is advised to return to urgent care I do not see any danger signs with the clinical presentation.  I suspect this is  gastritis/peptic ulcer disease.  If patient continues to have symptoms after a trial of Protonix then he may benefit from gastroenterology evaluation/EGD.  Patient verbalized understanding. Final Clinical Impressions(s) / UC Diagnoses   Final diagnoses:  Periumbilical abdominal pain  Loss of  appetite     Discharge Instructions     Please drink ensure with each meal Return to urgent care if symptoms does not improve or if symptoms worsens.   ED Prescriptions    Medication Sig Dispense Auth. Provider   pantoprazole (PROTONIX) 20 MG tablet Take 1 tablet (20 mg total) by mouth daily. 30 tablet Ronni Osterberg, Myrene Galas, MD     PDMP not reviewed this encounter.   Chase Picket, MD 01/06/20 1228

## 2020-01-06 NOTE — ED Triage Notes (Signed)
Pt presents with abdominal pain, feeling full and low appetite. Pt states he has not been able to eat for the past 3 days and he thinks this is what triggers the abdominal pain. Pt denies chest pain, shortness of breath, vomiting, acid reflux.

## 2020-01-06 NOTE — Discharge Instructions (Signed)
Please drink ensure with each meal Return to urgent care if symptoms does not improve or if symptoms worsens.

## 2020-01-06 NOTE — Telephone Encounter (Signed)
Chief Complaint ABDOMINAL PAIN - Severe and only in abdomen Reason for Call Symptomatic / Request for [place] states husband is having severe lower abd pain; not eating d/t stomach feeling full; BP 155/84 [facility] Translation No Nurse Assessment Nurse: Rock Nephew, RN, Juliann Pulse Date/Time (Eastern Time): 01/06/2020 8:52:12 AM Confirm and document reason for call. If symptomatic, describe symptoms. ---Caller stated her husband is having severe abdominal pain . His BP is 155/84. Has the patient had close contact with a person known or suspected to have the novel coronavirus illness OR traveled / lives in area with major community spread (including international travel) in the last 14 days from the onset of symptoms? * If Asymptomatic, screen for exposure and travel within the last 14 days. ---No Does the patient have any new or worsening symptoms? ---Yes Will a triage be completed? ---Yes Related visit to physician within the last 2 weeks? ---No Does the PT have any chronic conditions? (i.e. diabetes, asthma, this includes High risk factors for pregnancy, etc.) ---Yes List chronic conditions. ---HTN Is this a behavioral health or substance abuse call? ---NoPLEASE NOTE: All timestamps contained within this report are represented as [place] Standard Time. CONFIDENTIALTY NOTICE: This fax transmission is intended only for the addressee. It contains information that is legally privileged, confidential or otherwise protected from use or disclosure. If you are not the intended recipient, you are strictly prohibited from reviewing, disclosing, copying using or disseminating any of this information or taking any action in reliance on or regarding this information. If you have received this fax in error, please notify us immediately by telephone so that we can arrange for its return to [place]. Phone: [phone], Toll-Free: [phone], Fax:  [phone] Page: 2 of 2 Call Id: [id number] Guidelines Guideline Title Affirmed Question Affirmed Notes Nurse Date/Time Eilene Ghazi Time) Abdominal Pain - Male [1] SEVERE pain (e.g., excruciating) AND [2] present > 1 hour Wells, RN, Juliann Pulse 01/06/2020 8:53:09 AM Disp. Time Eilene Ghazi Time) Disposition Final User 01/06/2020 8:49:19 AM Send to Urgent Queue Jerrye Beavers 01/06/2020 8:54:23 AM Go to ED Now Yes Rock Nephew, RN, Gara Kroner Disagree/Comply Comply Caller Understands Yes PreDisposition Call Doctor Care Advice Given Per Guideline GO TO ED NOW: * You need to be seen in the Emergency Department. ANOTHER ADULT SHOULD DRIVE: * It is better and safer if another adult drives instead of you. NOTHING BY MOUTH: * Do not eat or drink anything for now. CARE ADVICE given per Abdominal Pain, Male (Adult) guideline.

## 2020-01-06 NOTE — Telephone Encounter (Signed)
Pt is at ED

## 2020-01-14 ENCOUNTER — Telehealth: Payer: Self-pay

## 2020-01-14 NOTE — Telephone Encounter (Signed)
Pt's symptoms started on and off for 3 weeks. He was seen at urgent care on 01/06/20 for abdominal pain and loss of appetite. He was given Protonix 20 mg. He still has no energy or appetite. Appetite is somewhat better, per patient and he states that he drinks plenty of fluids. He is constantly feeling weak everyday. He complains of "feeling terrible during the day". He does feel slightly better after waking up, but whenever he does any daily activities he feel very drained and is unable to do anything. Denies fever, SOB, dizziness, and weight loss.

## 2020-01-15 ENCOUNTER — Ambulatory Visit: Payer: Medicare HMO | Admitting: Physician Assistant

## 2020-01-15 ENCOUNTER — Encounter: Payer: Self-pay | Admitting: Physician Assistant

## 2020-01-15 ENCOUNTER — Other Ambulatory Visit: Payer: Self-pay

## 2020-01-15 ENCOUNTER — Telehealth: Payer: Self-pay

## 2020-01-15 ENCOUNTER — Ambulatory Visit (INDEPENDENT_AMBULATORY_CARE_PROVIDER_SITE_OTHER): Payer: Medicare HMO | Admitting: Physician Assistant

## 2020-01-15 VITALS — BP 154/78 | HR 68 | Temp 98.4°F | Ht 68.5 in | Wt 162.0 lb

## 2020-01-15 DIAGNOSIS — R1084 Generalized abdominal pain: Secondary | ICD-10-CM | POA: Diagnosis not present

## 2020-01-15 DIAGNOSIS — R5383 Other fatigue: Secondary | ICD-10-CM | POA: Diagnosis not present

## 2020-01-15 DIAGNOSIS — R630 Anorexia: Secondary | ICD-10-CM

## 2020-01-15 LAB — CBC WITH DIFFERENTIAL/PLATELET
Basophils Absolute: 0 10*3/uL (ref 0.0–0.1)
Basophils Relative: 0.9 % (ref 0.0–3.0)
Eosinophils Absolute: 0 10*3/uL (ref 0.0–0.7)
Eosinophils Relative: 1 % (ref 0.0–5.0)
HCT: 36.6 % — ABNORMAL LOW (ref 39.0–52.0)
Hemoglobin: 12.4 g/dL — ABNORMAL LOW (ref 13.0–17.0)
Lymphocytes Relative: 27.7 % (ref 12.0–46.0)
Lymphs Abs: 1.2 10*3/uL (ref 0.7–4.0)
MCHC: 33.8 g/dL (ref 30.0–36.0)
MCV: 90.1 fl (ref 78.0–100.0)
Monocytes Absolute: 0.6 10*3/uL (ref 0.1–1.0)
Monocytes Relative: 13.2 % — ABNORMAL HIGH (ref 3.0–12.0)
Neutro Abs: 2.4 10*3/uL (ref 1.4–7.7)
Neutrophils Relative %: 57.2 % (ref 43.0–77.0)
Platelets: 312 10*3/uL (ref 150.0–400.0)
RBC: 4.06 Mil/uL — ABNORMAL LOW (ref 4.22–5.81)
RDW: 13.3 % (ref 11.5–15.5)
WBC: 4.3 10*3/uL (ref 4.0–10.5)

## 2020-01-15 LAB — URINALYSIS, ROUTINE W REFLEX MICROSCOPIC
Bilirubin Urine: NEGATIVE
Hgb urine dipstick: NEGATIVE
Ketones, ur: NEGATIVE
Leukocytes,Ua: NEGATIVE
Nitrite: NEGATIVE
Specific Gravity, Urine: 1.01 (ref 1.000–1.030)
Total Protein, Urine: NEGATIVE
Urine Glucose: NEGATIVE
Urobilinogen, UA: 0.2 (ref 0.0–1.0)
pH: 6.5 (ref 5.0–8.0)

## 2020-01-15 LAB — COMPREHENSIVE METABOLIC PANEL
ALT: 43 U/L (ref 0–53)
AST: 36 U/L (ref 0–37)
Albumin: 4.1 g/dL (ref 3.5–5.2)
Alkaline Phosphatase: 106 U/L (ref 39–117)
BUN: 23 mg/dL (ref 6–23)
CO2: 28 mEq/L (ref 19–32)
Calcium: 9.5 mg/dL (ref 8.4–10.5)
Chloride: 95 mEq/L — ABNORMAL LOW (ref 96–112)
Creatinine, Ser: 1.49 mg/dL (ref 0.40–1.50)
GFR: 54.1 mL/min — ABNORMAL LOW (ref >60.00)
Glucose, Bld: 91 mg/dL (ref 70–99)
Potassium: 4.1 mEq/L (ref 3.5–5.1)
Sodium: 133 mEq/L — ABNORMAL LOW (ref 135–145)
Total Bilirubin: 0.6 mg/dL (ref 0.2–1.2)
Total Protein: 7.5 g/dL (ref 6.0–8.3)

## 2020-01-15 LAB — LIPASE: Lipase: 266 U/L — ABNORMAL HIGH (ref 11.0–59.0)

## 2020-01-15 LAB — TSH: TSH: 1.67 u[IU]/mL (ref 0.35–4.50)

## 2020-01-15 LAB — POCT HEMOGLOBIN: Hemoglobin: 12.3 g/dL (ref 11–14.6)

## 2020-01-15 NOTE — Telephone Encounter (Signed)
Noted  

## 2020-01-15 NOTE — Telephone Encounter (Signed)
Do you want CT abd w/ contrast or w/out?

## 2020-01-15 NOTE — Progress Notes (Signed)
Isaiah Davidson is a 86 y.o. male here for fatigue and loss of appetite.  I acted as a Education administrator for [employer], PA-C [facility], [place]   History of Present Illness:   Chief Complaint  Patient presents with  . Fatigue  . Decrease of Appetite    HPI  Fatigue & Loss of appetite Pt was seen at urgent care on 01/06/20 for abdominal pain and loss of appetite. Work-up included CBC and CMP which showed sodium 131, creatinine 1.56, GFR 46, CBC with mild decrease from 13 to 12.8. He was suspected to have gastritis/PUD and was started on Protonix 20 mg daily. He has been taking protonix which has helped his appetite slightly, but his abdominal pain persists.  Total his symptoms have been going on for 3 weeks. Symptoms are intermittent. He describes his abdominal pain as "burning" and mild. He does have significant history of gangrenous cholecystitis in 2019 that required surgery.  He is concerned because he has no energy or appetite. He eats a small meal in the morning and tries to go out and do yard work but will feel very fatigued after doing a few minutes of work.  He states that he drinks plenty of fluids, maybe 3 bottles of water daily.   He is still having minor abdomen discomfort, describes as a "burning" sensation around his umbilicus. He had a BM this morning in which he indicates that it was normal.   Denies fever, SOB, chest pain, dizziness, nausea, vomiting, no blood in stools, lightheadedness, change in urination, constipation, diarrhea. Has lost about 4 lb since physical in May with PCP.    Past Medical History:  Diagnosis Date  . Heart murmur   . HYPERLIPIDEMIA 02/22/2007  . HYPERTENSION 02/22/2007  . MCI (mild cognitive impairment) with memory loss 05/14/2014  . Seizures ([place])    treated for siezures around 2010     Social History   Tobacco Use  . Smoking status: Former Smoker    Packs/day: 1.00    Years: 30.00    Pack years: 30.00    Types: Cigars, Cigarettes     Quit date: 07/11/1978    Years since quitting: 41.5  . Smokeless tobacco: Never Used  Vaping Use  . Vaping Use: Never used  Substance Use Topics  . Alcohol use: Not Currently    Alcohol/week: 0.0 standard drinks  . Drug use: Never    Past Surgical History:  Procedure Laterality Date  . CARPAL TUNNEL RELEASE Bilateral   . CHOLECYSTECTOMY N/A 06/15/2018   Procedure: LAPAROSCOPIC CHOLECYSTECTOMY;  Surgeon: Kinsinger, Arta Bruce, MD;  Location: [facility];  Service: General;  Laterality: N/A;  . KNEE ARTHROSCOPY Right     Family History  Problem Relation Age of Onset  . Diabetes Paternal Grandmother        raised by grandparents    No Known Allergies  Current Medications:   Current Outpatient Medications:  .  allopurinol (ZYLOPRIM) 100 MG tablet, Take 1 tablet (100 mg total) by mouth daily., Disp: 90 tablet, Rfl: 3 .  amLODipine (NORVASC) 10 MG tablet, Take 1 tablet (10 mg total) by mouth daily., Disp: 90 tablet, Rfl: 3 .  chlorthalidone (HYGROTON) 25 MG tablet, Take 1 tablet (25 mg total) by mouth daily., Disp: 90 tablet, Rfl: 3 .  donepezil (ARICEPT) 10 MG tablet, Take 1 tablet (10 mg total) by mouth at bedtime., Disp: 90 tablet, Rfl: 3 .  levETIRAcetam (KEPPRA) 500 MG tablet, Take 1 tablet (500 mg total)  by mouth 2 (two) times daily., Disp: 180 tablet, Rfl: 3 .  Multiple Vitamin (MULTIVITAMIN) capsule, Take 1 capsule by mouth daily.  , Disp: , Rfl:  .  pantoprazole (PROTONIX) 20 MG tablet, Take 1 tablet (20 mg total) by mouth daily., Disp: 30 tablet, Rfl: 0 .  rosuvastatin (CRESTOR) 20 MG tablet, Take 1 tablet (20 mg total) by mouth daily., Disp: 90 tablet, Rfl: 3   Review of Systems:   ROS  Negative unless otherwise specified per HPI.  Vitals:   Vitals:   01/15/20 1009 01/15/20 1034  BP: (!) 172/90 (!) 154/78  Pulse: 68   Temp: 98.4 F (36.9 C)   TempSrc: Temporal   SpO2: 94%   Weight: 162 lb (73.5 kg)   Height: 5' 8.5" (1.74 m)      Body mass index is 24.27  kg/m.  Physical Exam:   Physical Exam Vitals and nursing note reviewed.  Constitutional:      General: He is not in acute distress.    Appearance: He is well-developed. He is not ill-appearing or toxic-appearing.  Cardiovascular:     Rate and Rhythm: Normal rate and regular rhythm.     Pulses: Normal pulses.     Heart sounds: Normal heart sounds, S1 normal and S2 normal.     Comments: No LE edema Pulmonary:     Effort: Pulmonary effort is normal.     Breath sounds: Normal breath sounds.  Abdominal:     General: Abdomen is flat. Bowel sounds are normal.     Palpations: Abdomen is soft.     Tenderness: There is no abdominal tenderness. There is no right CVA tenderness, left CVA tenderness, guarding or rebound.     Comments: No point tenderness to abdomen  Skin:    General: Skin is warm and dry.  Neurological:     Mental Status: He is alert.     GCS: GCS eye subscore is 4. GCS verbal subscore is 5. GCS motor subscore is 6.  Psychiatric:        Speech: Speech normal.        Behavior: Behavior normal. Behavior is cooperative.     Results for orders placed or performed in visit on 01/15/20  POCT hemoglobin  Result Value Ref Range   Hemoglobin 12.3 11 - 14.6 g/dL    Assessment and Plan:   Isaiah Davidson was seen today for fatigue and decrease of appetite.  Diagnoses and all orders for this visit:  Appetite loss Slightly improved on protonix. Recommended bland diet, hydration and Ensure prn. Will check labs for further evaluation. Close follow-up with PCP to ensure weight stabilizes. -     POCT hemoglobin -     Urinalysis, Routine w reflex microscopic  Other fatigue Unclear etiology. Will update labs. His Hgb is slowly dropping -- but denies any gross blood loss. Encouraged hydration and bland diet. Avoid aggressive activity. Close follow-up with PCP to follow-up on these issues. -     POCT hemoglobin -     Lipase -     TSH -     Comprehensive metabolic panel -     CBC with  Differential/Platelet  Generalized abdominal pain No red flags on exam. No evidence of acute abdomen. Will obtain labs and urinalysis for further evaluation. Discussed that if symptoms persist, will likely need abdominal imaging. We also reviewed red flags and ER precautions. -     Lipase -     TSH -  Comprehensive metabolic panel -     CBC with Differential/Platelet -     Urinalysis, Routine w reflex microscopic  . Reviewed expectations re: course of current medical issues. . Discussed self-management of symptoms. . Outlined signs and symptoms indicating need for more acute intervention. . Patient verbalized understanding and all questions were answered. . See orders for this visit as documented in the electronic medical record. . Patient received an After-Visit Summary.  CMA or LPN served as scribe during this visit. History, Physical, and Plan performed by medical provider. The above documentation has been reviewed and is accurate and complete.  Inda Coke, PA-C

## 2020-01-15 NOTE — Patient Instructions (Signed)
It was great to see you!  I will be in touch with your labs to see what's going on.  Please review your medications when you go home and make sure you are on the appropriate medications.   Many things can cause belly (abdominal) pain. Most times, belly pain is not dangerous. Many cases of belly pain can be watched and treated at home. Sometimes belly pain is serious, though. Your doctor will try to find the cause of your belly pain.   Follow these instructions at home:  Take over-the-counter and prescription medicines only as told by your doctor. Do not take medicines that help you poop (laxatives) unless told to by your doctor.  Drink enough fluid to keep your pee (urine) clear or pale yellow.  Watch your belly pain for any changes.  Keep all follow-up visits as told by your doctor. This is important.  Contact a doctor if:  Your belly pain changes or gets worse.  You are not hungry, or you lose weight without trying.  You are having trouble pooping (constipated) or have watery poop (diarrhea) for more than 2-3 days.  You have pain when you pee or poop.  Your belly pain wakes you up at night.  Your pain gets worse with meals, after eating, or with certain foods.  You are throwing up and cannot keep anything down.  You have a fever.  Get help right away if:  Your pain does not go away as soon as your doctor says it should.  You cannot stop throwing up.  Your pain is only in areas of your belly, such as the right side or the left lower part of the belly.  You have bloody or black poop, or poop that looks like tar.  You have very bad pain, cramping, or bloating in your belly.  You have signs of not having enough fluid or water in your body (dehydration), such as: ? Dark pee, very little pee, or no pee. ? Cracked lips. ? Dry mouth. ? Sunken eyes. ? Sleepiness. ? Weakness.  Take care,  Inda Coke PA-C

## 2020-01-15 NOTE — Progress Notes (Deleted)
Isaiah Davidson is a 86 y.o. male here for fatigue and loss of appetite.  I acted as a Education administrator for [employer], PA-C [facility], [place]  History of Present Illness:   No chief complaint on file.   HPI   Fatigue & loss of appetite Pt was seen in urgent care on 01/06/20 for abdominal pain and loss of appetite. He c/o increased symptoms of fatigue during the day. He was prescribed Protonix, but has not taken this. Symptoms started about 3 weeks ago. He still has no energy and has a poor appetite. Appetite is somewhat better, per patient and he states that he drinks plenty of fluids. He is constantly feeling weak everyday. He complains of "feeling terrible during the day". He does feel slightly better after waking up, but whenever he does any daily activities he feel very drained and is unable to do anything. Denies fever, SOB, dizziness, and weight loss.  Past Medical History:  Diagnosis Date  . Heart murmur   . HYPERLIPIDEMIA 02/22/2007  . HYPERTENSION 02/22/2007  . MCI (mild cognitive impairment) with memory loss 05/14/2014  . Seizures ([place])    treated for siezures around 2010     Social History   Tobacco Use  . Smoking status: Former Smoker    Packs/day: 1.00    Years: 30.00    Pack years: 30.00    Types: Cigars, Cigarettes    Quit date: 07/11/1978    Years since quitting: 41.5  . Smokeless tobacco: Never Used  Vaping Use  . Vaping Use: Never used  Substance Use Topics  . Alcohol use: Not Currently    Alcohol/week: 0.0 standard drinks  . Drug use: Never    Past Surgical History:  Procedure Laterality Date  . CARPAL TUNNEL RELEASE Bilateral   . CHOLECYSTECTOMY N/A 06/15/2018   Procedure: LAPAROSCOPIC CHOLECYSTECTOMY;  Surgeon: Kinsinger, Arta Bruce, MD;  Location: [facility];  Service: General;  Laterality: N/A;  . KNEE ARTHROSCOPY Right     Family History  Problem Relation Age of Onset  . Diabetes Paternal Grandmother        raised by grandparents    No Known  Allergies  Current Medications:   Current Outpatient Medications:  .  allopurinol (ZYLOPRIM) 100 MG tablet, Take 1 tablet (100 mg total) by mouth daily., Disp: 90 tablet, Rfl: 3 .  amLODipine (NORVASC) 10 MG tablet, Take 1 tablet (10 mg total) by mouth daily., Disp: 90 tablet, Rfl: 3 .  chlorthalidone (HYGROTON) 25 MG tablet, Take 1 tablet (25 mg total) by mouth daily., Disp: 90 tablet, Rfl: 3 .  donepezil (ARICEPT) 10 MG tablet, Take 1 tablet (10 mg total) by mouth at bedtime., Disp: 90 tablet, Rfl: 3 .  levETIRAcetam (KEPPRA) 500 MG tablet, Take 1 tablet (500 mg total) by mouth 2 (two) times daily., Disp: 180 tablet, Rfl: 3 .  Multiple Vitamin (MULTIVITAMIN) capsule, Take 1 capsule by mouth daily.  , Disp: , Rfl:  .  pantoprazole (PROTONIX) 20 MG tablet, Take 1 tablet (20 mg total) by mouth daily., Disp: 30 tablet, Rfl: 0 .  rosuvastatin (CRESTOR) 20 MG tablet, Take 1 tablet (20 mg total) by mouth daily., Disp: 90 tablet, Rfl: 3   Review of Systems:   ROS  Vitals:   There were no vitals filed for this visit.   There is no height or weight on file to calculate BMI.  Physical Exam:   Physical Exam  Results for orders placed or performed during the hospital  encounter of 01/06/20  CBC with Differential  Result Value Ref Range   WBC 5.2 4.0 - 10.5 K/uL   RBC 4.23 4.22 - 5.81 MIL/uL   Hemoglobin 12.8 (L) 13.0 - 17.0 g/dL   HCT 37.8 (L) 39 - 52 %   MCV 89.4 80.0 - 100.0 fL   MCH 30.3 26.0 - 34.0 pg   MCHC 33.9 30.0 - 36.0 g/dL   RDW 12.6 11.5 - 15.5 %   Platelets 238 150 - 400 K/uL   nRBC 0.0 0.0 - 0.2 %   Neutrophils Relative % 61 %   Neutro Abs 3.1 1.7 - 7.7 K/uL   Lymphocytes Relative 26 %   Lymphs Abs 1.4 0.7 - 4.0 K/uL   Monocytes Relative 13 %   Monocytes Absolute 0.7 0 - 1 K/uL   Eosinophils Relative 0 %   Eosinophils Absolute 0.0 0 - 0 K/uL   Basophils Relative 0 %   Basophils Absolute 0.0 0 - 0 K/uL   Immature Granulocytes 0 %   Abs Immature Granulocytes 0.01  0.00 - 0.07 K/uL  Comprehensive metabolic panel  Result Value Ref Range   Sodium 131 (L) 135 - 145 mmol/L   Potassium 3.6 3.5 - 5.1 mmol/L   Chloride 94 (L) 98 - 111 mmol/L   CO2 26 22 - 32 mmol/L   Glucose, Bld 97 70 - 99 mg/dL   BUN 18 8 - 23 mg/dL   Creatinine, Ser 1.56 (H) 0.61 - 1.24 mg/dL   Calcium 9.3 8.9 - 10.3 mg/dL   Total Protein 7.8 6.5 - 8.1 g/dL   Albumin 3.6 3.5 - 5.0 g/dL   AST 30 15 - 41 U/L   ALT 28 0 - 44 U/L   Alkaline Phosphatase 95 38 - 126 U/L   Total Bilirubin 1.0 0.3 - 1.2 mg/dL   GFR calc non Af Amer 40 (L) >60 mL/min   GFR calc Af Amer 46 (L) >60 mL/min   Anion gap 11 5 - 15    Assessment and Plan:   There are no diagnoses linked to this encounter.  . Reviewed expectations re: course of current medical issues. . Discussed self-management of symptoms. . Outlined signs and symptoms indicating need for more acute intervention. . Patient verbalized understanding and all questions were answered. . See orders for this visit as documented in the electronic medical record. . Patient received an After-Visit Summary.  ***  Inda Coke, PA-C

## 2020-01-16 ENCOUNTER — Other Ambulatory Visit: Payer: Self-pay

## 2020-01-16 DIAGNOSIS — R1084 Generalized abdominal pain: Secondary | ICD-10-CM

## 2020-01-16 DIAGNOSIS — R748 Abnormal levels of other serum enzymes: Secondary | ICD-10-CM

## 2020-01-16 DIAGNOSIS — R630 Anorexia: Secondary | ICD-10-CM

## 2020-01-16 NOTE — Telephone Encounter (Signed)
I think it would be more likely to get scheduled sooner if it is at [facility] or [place] long as an urgent. The referral was already scheduled for 7/22, so that is probably the soonest they were able to get him in at [place].

## 2020-01-16 NOTE — Telephone Encounter (Signed)
Do you know if we can get this week?

## 2020-01-16 NOTE — Telephone Encounter (Signed)
Please advise 

## 2020-01-16 NOTE — Telephone Encounter (Signed)
Noted. CT abd pelvis w/ contrast order placed under elevated lipase and generalized abd pain.

## 2020-01-16 NOTE — Telephone Encounter (Signed)
With contrast

## 2020-01-16 NOTE — Telephone Encounter (Signed)
Can they get that done this week?

## 2020-01-16 NOTE — Telephone Encounter (Signed)
Can change to [place] or [place]. Does [place] [facility] not cover his insurance? It hought we could get folks in quicker there

## 2020-01-17 ENCOUNTER — Other Ambulatory Visit: Payer: Self-pay

## 2020-01-17 DIAGNOSIS — R1084 Generalized abdominal pain: Secondary | ICD-10-CM

## 2020-01-17 DIAGNOSIS — R748 Abnormal levels of other serum enzymes: Secondary | ICD-10-CM

## 2020-01-17 NOTE — Telephone Encounter (Signed)
Think I have updated right let me know if not

## 2020-01-17 NOTE — Telephone Encounter (Signed)
Let me know where I need to change to

## 2020-01-17 NOTE — Telephone Encounter (Signed)
Think I fixed it

## 2020-01-17 NOTE — Telephone Encounter (Signed)
Please change to [facility] or [facility] and make it urgent. I will call and change the prior auth.

## 2020-01-17 NOTE — Telephone Encounter (Signed)
It's still showing as routine on my end. Could you make start a new referral? That may be easier.

## 2020-01-20 ENCOUNTER — Ambulatory Visit (HOSPITAL_COMMUNITY): Payer: Medicare HMO

## 2020-01-21 ENCOUNTER — Ambulatory Visit (HOSPITAL_COMMUNITY)
Admission: RE | Admit: 2020-01-21 | Discharge: 2020-01-21 | Disposition: A | Discharge status: Home / Self Care | Payer: Medicare HMO | Source: Ambulatory Visit | Attending: Family Medicine | Admitting: Family Medicine

## 2020-01-21 DIAGNOSIS — R1084 Generalized abdominal pain: Secondary | ICD-10-CM | POA: Diagnosis not present

## 2020-01-21 DIAGNOSIS — K8689 Other specified diseases of pancreas: Secondary | ICD-10-CM | POA: Diagnosis not present

## 2020-01-21 DIAGNOSIS — K838 Other specified diseases of biliary tract: Secondary | ICD-10-CM | POA: Diagnosis not present

## 2020-01-21 DIAGNOSIS — K7689 Other specified diseases of liver: Secondary | ICD-10-CM | POA: Diagnosis not present

## 2020-01-21 DIAGNOSIS — R748 Abnormal levels of other serum enzymes: Secondary | ICD-10-CM | POA: Diagnosis not present

## 2020-01-21 DIAGNOSIS — I700 Atherosclerosis of aorta: Secondary | ICD-10-CM | POA: Diagnosis not present

## 2020-01-21 MED ORDER — IOHEXOL 300 MG/ML  SOLN
100.0000 mL | Freq: Once | INTRAMUSCULAR | Status: AC | PRN
  Administered 2020-01-21: 100 mL via INTRAVENOUS

## 2020-01-30 ENCOUNTER — Other Ambulatory Visit: Payer: Medicare HMO

## 2020-02-06 ENCOUNTER — Encounter: Payer: Self-pay | Admitting: Family Medicine

## 2020-02-06 ENCOUNTER — Other Ambulatory Visit: Payer: Self-pay

## 2020-02-06 ENCOUNTER — Ambulatory Visit (INDEPENDENT_AMBULATORY_CARE_PROVIDER_SITE_OTHER): Payer: Medicare HMO | Admitting: Family Medicine

## 2020-02-06 VITALS — BP 126/82 | HR 74 | Temp 98.0°F | Resp 18 | Ht 69.0 in | Wt 160.8 lb

## 2020-02-06 DIAGNOSIS — D649 Anemia, unspecified: Secondary | ICD-10-CM

## 2020-02-06 DIAGNOSIS — G40909 Epilepsy, unspecified, not intractable, without status epilepticus: Secondary | ICD-10-CM

## 2020-02-06 DIAGNOSIS — I10 Essential (primary) hypertension: Secondary | ICD-10-CM | POA: Diagnosis not present

## 2020-02-06 DIAGNOSIS — R630 Anorexia: Secondary | ICD-10-CM

## 2020-02-06 DIAGNOSIS — R1084 Generalized abdominal pain: Secondary | ICD-10-CM | POA: Diagnosis not present

## 2020-02-06 DIAGNOSIS — R5383 Other fatigue: Secondary | ICD-10-CM | POA: Diagnosis not present

## 2020-02-06 DIAGNOSIS — K8689 Other specified diseases of pancreas: Secondary | ICD-10-CM

## 2020-02-06 DIAGNOSIS — Z79899 Other long term (current) drug therapy: Secondary | ICD-10-CM

## 2020-02-06 DIAGNOSIS — R748 Abnormal levels of other serum enzymes: Secondary | ICD-10-CM

## 2020-02-06 MED ORDER — PANTOPRAZOLE SODIUM 20 MG PO TBEC: 20.0000 mg | DELAYED_RELEASE_TABLET | Freq: Every day | ORAL | 0 refills | Status: DC

## 2020-02-06 NOTE — Progress Notes (Signed)
Phone [phone] In person visit   Subjective:   Isaiah Davidson is a 86 y.o. year old very pleasant male patient who presents for/with See problem oriented charting Chief Complaint  Patient presents with  . Abdominal Pain    Would like to discuss his recent CT   This visit occurred during the SARS-CoV-2 public health emergency.  Safety protocols were in place, including screening questions prior to the visit, additional usage of staff PPE, and extensive cleaning of exam room while observing appropriate contact time as indicated for disinfecting solutions.   Past Medical History-  Patient Active Problem List   Diagnosis Date Noted  . MCI (mild cognitive impairment) with memory loss 05/14/2014    Priority: High  . Seizure disorder ([place]) 04/19/2011    Priority: High  . Aortic atherosclerosis ([place]) 08/05/2016    Priority: Medium  . 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 Hyperlipidemia 02/22/2007    Priority: Medium  . Essential hypertension 02/22/2007    Priority: Medium  . Status post cholecystectomy 06/15/2018    Priority: Low  . Lipoma 02/02/2017    Priority: Low  . Finger infection 06/11/2014    Priority: Low  . Shortness of breath 10/12/2012    Priority: Low  . Carpal tunnel syndrome 08/18/2010    Priority: Low  . Cyst and pseudocyst of pancreas 04/12/2010    Priority: Low  . Diarrhea 07/09/2018    Medications- reviewed and updated Current Outpatient Medications  Medication Sig Dispense Refill  . allopurinol (ZYLOPRIM) 100 MG tablet Take 1 tablet (100 mg total) by mouth daily. 90 tablet 3  . amLODipine (NORVASC) 10 MG tablet Take 1 tablet (10 mg total) by mouth daily. 90 tablet 3  . chlorthalidone (HYGROTON) 25 MG tablet Take 1 tablet (25 mg total) by mouth daily. 90 tablet 3  . donepezil (ARICEPT) 10 MG tablet Take 1 tablet (10 mg total) by mouth at bedtime. 90 tablet 3  . levETIRAcetam (KEPPRA) 500 MG tablet Take 1 tablet (500 mg total) by  mouth 2 (two) times daily. 180 tablet 3  . Multiple Vitamin (MULTIVITAMIN) capsule Take 1 capsule by mouth daily.      . pantoprazole (PROTONIX) 20 MG tablet Take 1 tablet (20 mg total) by mouth daily. 30 tablet 0  . rosuvastatin (CRESTOR) 20 MG tablet Take 1 tablet (20 mg total) by mouth daily. 90 tablet 3   No current facility-administered medications for this visit.     Objective:  BP 126/82   Pulse 74   Temp 98 F (36.7 C) (Temporal)   Resp 18   Ht 5\' 9"  (1.753 m)   Wt 160 lb 12.8 oz (72.9 kg)   SpO2 94%   BMI 23.75 kg/m  Gen: NAD, resting comfortably CV: RRR no murmurs rubs or gallops Lungs: CTAB no crackles, wheeze, rhonchi Abdomen: soft/nontender/nondistended/normal bowel sounds. No rebound or guarding.  Ext: no edema Skin: warm, dry Neuro: Looks to wife for some answers with known baseline mild cognitive impairment    Assessment and Plan   # Abdominal Pain- now resolved # low appetite/weight loss # fatigue S:Patient called office on 01/06/2020 for abdominal pain and loss of appetite. Advised go to ED or urgent care.he was seen in urgent care that day-CBC with slight anemia, CMP with slight hyponatremia was performed.  There was concern for gastritis/peptic ulcer disease and he was started on Protonix 20 mg daily  On January 15, 2020 patient was seen at our clinic  by Inda Coke, PA.  Symptoms have been going on for 3 weeks at that point-had a diffuse abdominal burning sensation that was mild and seem to be improving on Protonix.  His main concern was lack of energy and appetite.  Patient did have a slightly elevated lipase on labs.  TSH is normal, CMP largely stable from urgent care, CBC largely stable from urgent care, UA reassuring.  Patient was continued on PPI and has been on that for about a month now  Due to elevated lipase we completed the CT scan with impression as below "1. Chronic pancreatic duct dilatation without pancreatic edema or fluid. No pancreatic mass  or evidence of pancreatic calcification. No pancreatic necrosis.  2. Extrahepatic biliary duct dilatation without obstructing focus. Gallbladder absent. Multiple hepatic cysts appear stable.  3. Stable renal masses bilaterally, likely complex cysts. Multiple simple cysts also noted. No hydronephrosis on either side. No renal or ureteral calculus on either side. Urinary bladder wall thickness normal.  4. No bowel wall thickening or bowel obstruction. No abscess in the abdomen or pelvis. Appendix appears normal.  5. Aortic Atherosclerosis (ICD10-I70.0). Multiple foci of arterial vascular calcification elsewhere."  Patient reports abdominal pain has resolved but he has lingering issues with fatigue and low appetite. extremely tired. If goes outside for 10 minutes- has to come in and rest and this is abnormal for him. Ends up having to lay down. Does not feel lightheaded or dizzy. Feels "sort of" short of breath but no chest pain. Sweatier with it. No left arm or neck pain. No reported seizures.   Prior abdominal pain was when he was hungry and seemed to improve with eating-possible duodenal ulcer. Down 6 lbs since may A/P: 86 year old male who presented about a month ago to urgent care with abdominal pain, low appetite, fatigue.  Abdominal pain improved with eating and was worse when he became hungry concern for duodenal ulcer.  Abdominal pain has resolved with Protonix x1 month-we opted to extend an additional month in case other symptoms of fatigue and low appetite are related to ulceration which needs more time to heal.  CT scan as above was largely reassuring other than pancreatic duct dilation  Due to possible ulceration and ongoing fatigue/poor appetite-went ahead and placed a referral to GI to consider endoscopy.  I also want GIs opinion on dilated pancreatic ducts.  He also had an elevated lipase with no clear pancreatitis on exam-I am to check another lipase to evaluate the trend.   Patient also has very mild anemia-seems to be an intermittent issue since 2019 it would be worth their opinion on colonoscopy as well although I do not feel strongly about this as I do not strongly suspect colon cancer  We also discussed renal cysts and possible referral to urology-wife and patient would like to hold off at this time and begin with GI evaluation.  In regards to fatigue-update labs including B12 levels get TSH as recently normal  Does report perhaps mild shortness of breath-we will trend this and see if patient needs further cardiac work-up   # Seizure disorder (HCC)-has neurology follow-up in November S: No obvious recurrence of seizures-patient is compliant with Keppra A/P: No sign of seizure recurrence-I do not think the Keppra is the cause of his fatigue  #hypertension S: medication: Amlodipine 10 mg, chlorthalidone 25 mg BP Readings from Last 3 Encounters:  02/06/20 126/82  01/15/20 (!) 154/78  01/06/20 (!) 145/84  A/P: Blood pressure is well controlled.  We  will check electrolytes and see if any worsening-with mild hyponatremia I do not think that would be the cause of fatigue   Recommended follow up: We have a November follow-up but certainly happy to see patient sooner with new or worsening or persistent symptoms Future Appointments  Date Time Provider [place]  05/22/2020  9:40 AM Marin Olp, MD [facility]  06/08/2020  9:00 AM Ward Givens, NP [facility] None    Lab/Order associations:   ICD-10-CM   1. Fatigue, unspecified type  R53.83   2. Pancreatic duct dilated  K86.89 Ambulatory referral to Gastroenterology  3. Decreased appetite  R63.0 Ambulatory referral to Gastroenterology  4. Elevated lipase  R74.8 Ambulatory referral to Gastroenterology    Lipase    Lipase  5. Generalized abdominal pain  R10.84   6. Essential hypertension  I10 Comprehensive metabolic panel    Comprehensive metabolic panel  7. Seizure disorder ([place])  G40.909    8. High risk medication use  Z79.899 B12    B12  9. Anemia, unspecified type  D64.9 CBC with Differential/Platelet    CBC with Differential/Platelet    Meds ordered this encounter  Medications  . pantoprazole (PROTONIX) 20 MG tablet    Sig: Take 1 tablet (20 mg total) by mouth daily.    Dispense:  30 tablet    Refill:  0    Time Spent: 31 minutes of total time (2:43 PM-3:04 PM, 8:35 PM-8:45 PM) was spent on the date of the encounter performing the following actions: chart review prior to seeing the patient, obtaining history, performing a medically necessary exam, counseling on the treatment plan, placing orders, and documenting in our EHR.   Return precautions advised.  [name], MD

## 2020-02-06 NOTE — Patient Instructions (Signed)
We will call you within two weeks about your referral to GI . If you do not hear within 3 weeks, give [place] a call.   Please stop by lab before you go If you have mychart- we will send your results within 3 business days of [place] receiving them.  If you do not have mychart- we will call you about results within 5 business days of [place] receiving them.

## 2020-02-07 ENCOUNTER — Other Ambulatory Visit: Payer: Self-pay

## 2020-02-07 ENCOUNTER — Telehealth: Payer: Self-pay | Admitting: Family Medicine

## 2020-02-07 LAB — COMPREHENSIVE METABOLIC PANEL
AG Ratio: 1.2 (calc) (ref 1.0–2.5)
ALT: 36 U/L (ref 9–46)
AST: 32 U/L (ref 10–35)
Albumin: 4.1 g/dL (ref 3.6–5.1)
Alkaline phosphatase (APISO): 111 U/L (ref 35–144)
BUN/Creatinine Ratio: 13 (calc) (ref 6–22)
BUN: 18 mg/dL (ref 7–25)
CO2: 31 mmol/L (ref 20–32)
Calcium: 9.4 mg/dL (ref 8.6–10.3)
Chloride: 95 mmol/L — ABNORMAL LOW (ref 98–110)
Creat: 1.38 mg/dL — ABNORMAL HIGH (ref 0.70–1.11)
Globulin: 3.3 g/dL (calc) (ref 1.9–3.7)
Glucose, Bld: 103 mg/dL — ABNORMAL HIGH (ref 65–99)
Potassium: 4.2 mmol/L (ref 3.5–5.3)
Sodium: 133 mmol/L — ABNORMAL LOW (ref 135–146)
Total Bilirubin: 0.6 mg/dL (ref 0.2–1.2)
Total Protein: 7.4 g/dL (ref 6.1–8.1)

## 2020-02-07 LAB — CBC WITH DIFFERENTIAL/PLATELET
Absolute Monocytes: 689 cells/uL (ref 200–950)
Basophils Absolute: 21 cells/uL (ref 0–200)
Basophils Relative: 0.5 %
Eosinophils Absolute: 29 cells/uL (ref 15–500)
Eosinophils Relative: 0.7 %
HCT: 37.1 % — ABNORMAL LOW (ref 38.5–50.0)
Hemoglobin: 12.5 g/dL — ABNORMAL LOW (ref 13.2–17.1)
Lymphs Abs: 1214 cells/uL (ref 850–3900)
MCH: 30 pg (ref 27.0–33.0)
MCHC: 33.7 g/dL (ref 32.0–36.0)
MCV: 89 fL (ref 80.0–100.0)
MPV: 10.6 fL (ref 7.5–12.5)
Monocytes Relative: 16.4 %
Neutro Abs: 2247 cells/uL (ref 1500–7800)
Neutrophils Relative %: 53.5 %
Platelets: 196 10*3/uL (ref 140–400)
RBC: 4.17 10*6/uL — ABNORMAL LOW (ref 4.20–5.80)
RDW: 13.2 % (ref 11.0–15.0)
Total Lymphocyte: 28.9 %
WBC: 4.2 10*3/uL (ref 3.8–10.8)

## 2020-02-07 LAB — LIPASE: Lipase: 73 U/L — ABNORMAL HIGH (ref 7–60)

## 2020-02-07 LAB — VITAMIN B12: Vitamin B-12: 942 pg/mL (ref 200–1100)

## 2020-02-07 MED ORDER — PANTOPRAZOLE SODIUM 20 MG PO TBEC: 20.0000 mg | DELAYED_RELEASE_TABLET | Freq: Every day | ORAL | 0 refills | Status: DC

## 2020-02-07 NOTE — Telephone Encounter (Signed)
Called in refill to local pharmacy. 

## 2020-02-07 NOTE — Telephone Encounter (Signed)
RX was sent to the wrong pharmacy please send to   LAST APPOINTMENT DATE: 02/06/2020   NEXT APPOINTMENT DATE:@11 /06/2020  MEDICATION:pantoprazole (PROTONIX) 20 MG tablet  [place]  **Let patient know to contact pharmacy at the end of the day to make sure medication is ready. **  ** Please notify patient to allow 48-72 hours to process**  **Encourage patient to contact the pharmacy for refills or they can request refills through [facility]**  CLINICAL FILLS OUT ALL BELOW:   LAST REFILL:  QTY:  REFILL DATE:    OTHER COMMENTS:    Okay for refill?  Please advise

## 2020-02-10 ENCOUNTER — Encounter: Payer: Self-pay | Admitting: Nurse Practitioner

## 2020-03-23 ENCOUNTER — Other Ambulatory Visit (INDEPENDENT_AMBULATORY_CARE_PROVIDER_SITE_OTHER): Payer: Medicare HMO

## 2020-03-23 ENCOUNTER — Encounter: Payer: Self-pay | Admitting: Nurse Practitioner

## 2020-03-23 ENCOUNTER — Ambulatory Visit: Payer: Medicare HMO | Admitting: Nurse Practitioner

## 2020-03-23 VITALS — BP 122/78 | HR 81 | Ht 69.0 in | Wt 158.0 lb

## 2020-03-23 DIAGNOSIS — R634 Abnormal weight loss: Secondary | ICD-10-CM

## 2020-03-23 DIAGNOSIS — R748 Abnormal levels of other serum enzymes: Secondary | ICD-10-CM

## 2020-03-23 DIAGNOSIS — Z86010 Personal history of colonic polyps: Secondary | ICD-10-CM

## 2020-03-23 DIAGNOSIS — K8689 Other specified diseases of pancreas: Secondary | ICD-10-CM | POA: Diagnosis not present

## 2020-03-23 DIAGNOSIS — R6881 Early satiety: Secondary | ICD-10-CM

## 2020-03-23 DIAGNOSIS — D649 Anemia, unspecified: Secondary | ICD-10-CM | POA: Diagnosis not present

## 2020-03-23 LAB — CBC WITH DIFFERENTIAL/PLATELET
Basophils Absolute: 0 10*3/uL (ref 0.0–0.1)
Basophils Relative: 0.7 % (ref 0.0–3.0)
Eosinophils Absolute: 0 10*3/uL (ref 0.0–0.7)
Eosinophils Relative: 0.6 % (ref 0.0–5.0)
HCT: 37.5 % — ABNORMAL LOW (ref 39.0–52.0)
Hemoglobin: 12.7 g/dL — ABNORMAL LOW (ref 13.0–17.0)
Lymphocytes Relative: 32.2 % (ref 12.0–46.0)
Lymphs Abs: 1.2 10*3/uL (ref 0.7–4.0)
MCHC: 33.8 g/dL (ref 30.0–36.0)
MCV: 90.6 fl (ref 78.0–100.0)
Monocytes Absolute: 0.7 10*3/uL (ref 0.1–1.0)
Monocytes Relative: 18.2 % — ABNORMAL HIGH (ref 3.0–12.0)
Neutro Abs: 1.9 10*3/uL (ref 1.4–7.7)
Neutrophils Relative %: 48.3 % (ref 43.0–77.0)
Platelets: 196 10*3/uL (ref 150.0–400.0)
RBC: 4.14 Mil/uL — ABNORMAL LOW (ref 4.22–5.81)
RDW: 14.2 % (ref 11.5–15.5)
WBC: 3.8 10*3/uL — ABNORMAL LOW (ref 4.0–10.5)

## 2020-03-23 LAB — COMPREHENSIVE METABOLIC PANEL
ALT: 34 U/L (ref 0–53)
AST: 31 U/L (ref 0–37)
Albumin: 4.4 g/dL (ref 3.5–5.2)
Alkaline Phosphatase: 119 U/L — ABNORMAL HIGH (ref 39–117)
BUN: 19 mg/dL (ref 6–23)
CO2: 26 mEq/L (ref 19–32)
Calcium: 9.7 mg/dL (ref 8.4–10.5)
Chloride: 94 mEq/L — ABNORMAL LOW (ref 96–112)
Creatinine, Ser: 1.44 mg/dL (ref 0.40–1.50)
GFR: 56.24 mL/min — ABNORMAL LOW (ref >60.00)
Glucose, Bld: 72 mg/dL (ref 70–99)
Potassium: 3.6 mEq/L (ref 3.5–5.1)
Sodium: 131 mEq/L — ABNORMAL LOW (ref 135–145)
Total Bilirubin: 0.6 mg/dL (ref 0.2–1.2)
Total Protein: 8.2 g/dL (ref 6.0–8.3)

## 2020-03-23 LAB — AMYLASE: Amylase: 118 U/L (ref 27–131)

## 2020-03-23 LAB — LIPASE: Lipase: 100 U/L — ABNORMAL HIGH (ref 11.0–59.0)

## 2020-03-23 LAB — FERRITIN: Ferritin: 772.4 ng/mL — ABNORMAL HIGH (ref 22.0–322.0)

## 2020-03-23 NOTE — Progress Notes (Signed)
03/23/2020 Isaiah Davidson [id number] 1932/02/25   CHIEF COMPLAINT: decreased appetite, early satiety   HISTORY OF PRESENT ILLNESS:  Isaiah Davidson is an 86 year old male with a past medical history of hypertension, hyperlipidemia, seizure disorder and H. Pylori gastritis 2011.   S/P cholecystectomy 2019. He was referred to our office today by Dr. [name] for decreased appetite and anemia. He denies having any abdominal pain. His appetite is decreased. He has early satiety. He feel like he has eaten too much but ate only a few bits. No nausea or vomiting. He complains of having fatigue. When he has low energy level and walks outside his house he develops CP and SOB which he stated he has discussed with Dr. Yong Channel. No current CP or SOB. He sleeps from 9pm to 8am. He naps intermittently during the day.  He denies having any dysphagia or heartburn.  He is taking Pantoprazole 20 mg once daily.  He has occasional constipation.  He occasionally takes a stool softener which results in passing loose stools.  He typically passes a normal brown formed stool most days.  No rectal bleeding or melena.  He has lost 5lbs over the past 2 months.  No fever, sweats or chills.  He was seen by Dr. Yong Channel in July 2021 for decreased appetite and central abdominal pain.  Labs 01/15/2020 showed a lipase level of 266 with normal LFTs.  Hemoglobin 12.4.  Hematocrit 36.6.  Repeat lipase level was 73 on 02/06/2020.  He underwent an abdominal/pelvic CT 01/21/2020 which identified chronic pancreatic duct dilatation and extrahepatic biliary duct dilatation.  Multiple hepatic cysts were noted.  Stable renal complex cyst were noted.  No evidence of bowel wall thickening or obstruction.  He denies any history of pancreatitis.  No alcohol use.  He underwent an EGD/EUS 10/15/2009 by Dr. Ardis Hughs due to an incidental dilation of the pancreatic duct which showed the main pancreatic duct was normal in head. In neck of pancreas, the  PD became cystically dilated, focally (the cystic component  measured 60m maximally). There were no associated masses, nodules near the cystic dilation of PC. The main PD in body, tail tapered normally but was still very slightly dilated (up to 35min tail). The EGD was positive for H. Pylori which was treated. No alcohol use.   He has a history of colon polyps.  His most recent colonoscopy was 12/09/2011 by Dr. JaArdis Hughs A diminutive tubular adenomatous polyp was removed from the ascending colon and moderate diverticulosis throughout the colon was noted.  No further colonoscopies were recommended due to his age.  No family history of colorectal cancer.  Labs 02/06/2020: Sodium 133.  Potassium 4.2.  Glucose 103.  BUN 18.  Creatinine 1.38.  Lipase 73.  AST 32.  ALT 36.  Total bili 0.6.  B12 942.  WBC 4.2.  Hemoglobin 12.5.  Hematocrit 37.1.  MCV 89.0.  Platelet 196.  Labs 01/15/2020: Lipase 266.  AST 36.  ALT 43.  Total bili 0.6.  Alk phos 106.  CBC Latest Ref Rng & Units 02/06/2020 01/15/2020 01/15/2020  WBC 3.8 - 10.8 Thousand/uL 4.2 4.3 -  Hemoglobin 13.2 - 17.1 g/dL 12.5(L) 12.4(L) 12.3  Hematocrit 38 - 50 % 37.1(L) 36.6(L) -  Platelets 140 - 400 Thousand/uL 196 312.0 -    CMP Latest Ref Rng & Units 02/06/2020 01/15/2020 01/06/2020  Glucose 65 - 99 mg/dL 103(H) 91 97  BUN 7 - 25 mg/dL 18 23 18  Creatinine 0.70 - 1.11 mg/dL 1.38(H) 1.49 1.56(H)  Sodium 135 - 146 mmol/L 133(L) 133(L) 131(L)  Potassium 3.5 - 5.3 mmol/L 4.2 4.1 3.6  Chloride 98 - 110 mmol/L 95(L) 95(L) 94(L)  CO2 20 - 32 mmol/L '31 28 26  ' Calcium 8.6 - 10.3 mg/dL 9.4 9.5 9.3  Total Protein 6.1 - 8.1 g/dL 7.4 7.5 7.8  Total Bilirubin 0.2 - 1.2 mg/dL 0.6 0.6 1.0  Alkaline Phos 39 - 117 U/L - 106 95  AST 10 - 35 U/L 32 36 30  ALT 9 - 46 U/L 36 43 28   Abdominal/pelvic CT 01/21/2020: 1. Chronic pancreatic duct dilatation without pancreatic edema or fluid. No pancreatic mass or evidence of pancreatic calcification. No pancreatic  necrosis.  2. Extrahepatic biliary duct dilatation without obstructing focus. Gallbladder absent. Multiple hepatic cysts appear stable.  3. Stable renal masses bilaterally, likely complex cysts. Multiple simple cysts also noted. No hydronephrosis on either side. No renal or ureteral calculus on either side. Urinary bladder wall thickness normal.  4. No bowel wall thickening or bowel obstruction. No abscess in the abdomen or pelvis. Appendix appears normal.  5. Aortic Atherosclerosis (ICD10-I70.0). Multiple foci of arterial vascular calcification elsewhere.  EGD/EUS 10/15/2009: 1. Normal esophagus     2. Moderate pan-gastritis.  Mucosal biopsies were taken and sent     for CLO, pathology to check for H. pylori.     3. Normal duodenum.           EUS findings:     1. Main pancreatic duct was normal in head.  In neck of pancreas     the PD became cystically dilated, focally (the cystic component     measured 21m maximally).  There were no associated masses, nodules     near the cystic dilation of PC. The main PD in body, tail tapered     normally but was still very slightly dilated (up to 365min tail).     2. Pancreatic parenchyma in uncinate, head, neck, body and tail     was normal.  There were no discrete solid masses and no clear     signs of chronic pancreatitis.     3. No peripancreatic or celiac adenopathy.     4. CBD was normal, non-dilated and without filling defects.     5. Gallbladder was normal.     6. Limited views of liver, spleen, portal and splenic vessels were     all normal.  CTAP with contrast done due to loss of appetite 10/07/2009: There is dilatation of the pancreatic duct, which measures as much  as 6-7 mm. The head of the pancreas and uncinate process are  prominent relative to the body and tail, and slightly  inhomogeneous. In addition, there is no fat plane between the head  of the pancreas and adjacent duodenum. These findings are  worrisome for a  subtle pancreatic head carcinoma. Recommend MRI or  endoscopic ultrasound for further assessment. There is no biliary  dilatation.    Past Medical History:  Diagnosis Date  . Heart murmur   . HYPERLIPIDEMIA 02/22/2007  . HYPERTENSION 02/22/2007  . MCI (mild cognitive impairment) with memory loss 05/14/2014  . Seizures ([facility]   treated for siezures around 2010   Past Surgical History:  Procedure Laterality Date  . CARPAL TUNNEL RELEASE Bilateral   . CHOLECYSTECTOMY N/A 06/15/2018   Procedure: LAPAROSCOPIC CHOLECYSTECTOMY;  Surgeon: Kinsinger, LuArta BruceMD;  [facility]: [facility];  Laterality: N/A;  . KNEE ARTHROSCOPY Right    Social History: He is married.  He is retired.  He has 1 son and 2 daughters.  Past smoker. He quit smoking 41 years ago. Previous alcohol use, none for 25 years. No drug use.   Family History: No family history of esophageal, gastric, colon or pancreatic cancer.  No Known Allergies    Outpatient Encounter Medications as of 03/23/2020  Medication Sig  . allopurinol (ZYLOPRIM) 100 MG tablet Take 1 tablet (100 mg total) by mouth daily.  [name] Kitchen amLODipine (NORVASC) 10 MG tablet Take 1 tablet (10 mg total) by mouth daily.  . chlorthalidone (HYGROTON) 25 MG tablet Take 1 tablet (25 mg total) by mouth daily.  [name] Kitchen donepezil (ARICEPT) 10 MG tablet Take 1 tablet (10 mg total) by mouth at bedtime.  . levETIRAcetam (KEPPRA) 500 MG tablet Take 1 tablet (500 mg total) by mouth 2 (two) times daily.  . Multiple Vitamin (MULTIVITAMIN) capsule Take 1 capsule by mouth daily.    . pantoprazole (PROTONIX) 20 MG tablet Take 1 tablet (20 mg total) by mouth daily.  . rosuvastatin (CRESTOR) 20 MG tablet Take 1 tablet (20 mg total) by mouth daily.   No facility-administered encounter medications on file as of 03/23/2020.     REVIEW OF SYSTEMS: All other systems reviewed and negative except where noted in the History of Present Illness.   PHYSICAL EXAM: BP 122/78    Pulse 81   Ht '5\' 9"'  (1.753 m)   Wt 158 lb (71.7 kg)   BMI 23.33 kg/m   Wt Readings from Last 3 Encounters:  03/23/20 158 lb (71.7 kg)  02/06/20 160 lb 12.8 oz (72.9 kg)  01/15/20 162 lb (73.5 kg)   General: Well developed  86 year old male in no acute distress. Head: Normocephalic and atraumatic. Eyes:  Sclerae non-icteric, conjunctive pink. Ears: Normal auditory acuity. Mouth: Upper and lower dentures.  No ulcers or lesions.  Neck: Supple, no lymphadenopathy or thyromegaly.  Lungs: Clear bilaterally to auscultation without wheezes, crackles or rhonchi. Heart: Regular rate and rhythm. No murmur, rub or gallop appreciated.  Abdomen: Soft, nontender, non distended. No masses. No hepatosplenomegaly. Normoactive bowel sounds x 4 quadrants.  Rectal: Deferred.  Musculoskeletal: Symmetrical with no gross deformities. Skin: Warm and dry. No rash or lesions on visible extremities. Extremities: No edema. Neurological: Alert oriented x 4, no focal deficits.  Psychological:  Alert and cooperative. Normal mood and affect.  ASSESSMENT AND PLAN:  6. 86 year old male with decreased appetite, early satiety, weight loss. Central abdominal pain July 20201 has resolved. CTAP 01/21/2020 showed a dilated pancreatic duct which is chronic and extrahepatic biliary duct dilatation without obstructing focus (not previously seen on EUS in 2011). Lipase 266 -> 74.  -CBC, CMP, Lipase and Amylase  -I will ask Dr. Ardis Hughs to review his CTAP 01/21/2020 results to verify if an abdomina MRI with MRCP is warranted. Recent  Cr. Level 1.38. He will need a repeat BMP prior to ordering MRI/MRCP w/wo contrast  -Consider EGD if symptoms persist  -Small more frequent meals -Patient to call our office if his symptoms worsen  2. Normocytic anemia. Hg 12.5. HCT 37.1. MCV 89. B12 942.  -CBC, iron, iron saturation, TIBC, Ferritin.   3. History of colon polyps -No plans for repeat colonoscopy at this time due to age. Further  recommendations per Dr. Ardis Hughs.        CC:  Marin Olp, MD

## 2020-03-23 NOTE — Patient Instructions (Signed)
If you are age 84 or older, your body mass index should be between 23-30. Your Body mass index is 23.33 kg/m. If this is out of the aforementioned range listed, please consider follow up with your Primary Care Provider.  If you are age 45 or younger, your body mass index should be between 19-25. Your Body mass index is 23.33 kg/m. If this is out of the aformentioned range listed, please consider follow up with your Primary Care Provider.   Your provider has requested that you go to the basement level for lab work before leaving today. Press "B" on the elevator. The lab is located at the first door on the left as you exit the elevator.  Dr Ardis Hughs to review abdominal pelvic ct results. Drink an 8 oz can of boost or ensure daily  Follow up with Dr Ardis Hughs in 3 months. Please call our office in November 2021 to schedule for December 2021  Due to recent changes in healthcare laws, you may see the results of your imaging and laboratory studies on MyChart before your provider has had a chance to review them.  We understand that in some cases there may be results that are confusing or concerning to you. Not all laboratory results come back in the same time frame and the provider may be waiting for multiple results in order to interpret others.  Please give [place] 48 hours in order for your provider to thoroughly review all the results before contacting the office for clarification of your results.   Thank you for choosing [facility] Noralyn Pick, CRNP  [phone]

## 2020-03-24 LAB — IRON, TOTAL/TOTAL IRON BINDING CAP
%SAT: 19 % (calc) — ABNORMAL LOW (ref 20–48)
Iron: 59 ug/dL (ref 50–180)
TIBC: 305 mcg/dL (calc) (ref 250–425)

## 2020-03-26 NOTE — Progress Notes (Signed)
I think MRI of pancreas with MRCP images is a good next step.    Hopefully he can avoid invasive testing given his age, frailty however depending on the MR results we may recommend EUS evaluation with biopsy.  Thanks

## 2020-03-27 ENCOUNTER — Other Ambulatory Visit: Payer: Self-pay

## 2020-03-27 DIAGNOSIS — R634 Abnormal weight loss: Secondary | ICD-10-CM

## 2020-03-27 DIAGNOSIS — R748 Abnormal levels of other serum enzymes: Secondary | ICD-10-CM

## 2020-03-27 DIAGNOSIS — K8689 Other specified diseases of pancreas: Secondary | ICD-10-CM

## 2020-03-27 NOTE — Progress Notes (Signed)
MRI/MRCP abd w/wo on 04/01/20 Patient aware

## 2020-04-01 ENCOUNTER — Other Ambulatory Visit: Payer: Self-pay

## 2020-04-01 ENCOUNTER — Ambulatory Visit (HOSPITAL_COMMUNITY)
Admission: RE | Admit: 2020-04-01 | Discharge: 2020-04-01 | Disposition: A | Discharge status: Home / Self Care | Payer: Medicare HMO | Source: Ambulatory Visit | Attending: Nurse Practitioner | Admitting: Nurse Practitioner

## 2020-04-01 DIAGNOSIS — K8689 Other specified diseases of pancreas: Secondary | ICD-10-CM | POA: Insufficient documentation

## 2020-04-01 DIAGNOSIS — R634 Abnormal weight loss: Secondary | ICD-10-CM | POA: Insufficient documentation

## 2020-04-01 DIAGNOSIS — R748 Abnormal levels of other serum enzymes: Secondary | ICD-10-CM | POA: Diagnosis not present

## 2020-04-01 DIAGNOSIS — K5730 Diverticulosis of large intestine without perforation or abscess without bleeding: Secondary | ICD-10-CM | POA: Diagnosis not present

## 2020-04-01 DIAGNOSIS — N281 Cyst of kidney, acquired: Secondary | ICD-10-CM | POA: Diagnosis not present

## 2020-04-01 MED ORDER — GADOBUTROL 1 MMOL/ML IV SOLN
7.0000 mL | Freq: Once | INTRAVENOUS | Status: AC | PRN
  Administered 2020-04-01: 7 mL via INTRAVENOUS

## 2020-04-02 ENCOUNTER — Telehealth: Payer: Self-pay

## 2020-04-02 NOTE — Telephone Encounter (Signed)
Received call about the imaging on this patient. Please review the MRI results from 04/01/20.

## 2020-04-02 NOTE — Telephone Encounter (Signed)
See CTAP notes 

## 2020-04-02 NOTE — Telephone Encounter (Signed)
Correction, see abd MRI notes.

## 2020-04-03 ENCOUNTER — Other Ambulatory Visit: Payer: Self-pay

## 2020-04-03 DIAGNOSIS — N281 Cyst of kidney, acquired: Secondary | ICD-10-CM

## 2020-04-03 NOTE — Addendum Note (Signed)
Addended by: Thomes Cake on: 04/03/2020 04:27 PM   Modules accepted: Orders

## 2020-04-08 ENCOUNTER — Other Ambulatory Visit: Payer: Self-pay | Admitting: Nurse Practitioner

## 2020-04-08 MED ORDER — PANCRELIPASE (LIP-PROT-AMYL) 36000-114000 UNITS PO CPEP
ORAL_CAPSULE | ORAL | 2 refills | Status: DC
Start: 2020-04-08 — End: 2020-05-26

## 2020-04-09 DIAGNOSIS — N281 Cyst of kidney, acquired: Secondary | ICD-10-CM | POA: Diagnosis not present

## 2020-04-09 DIAGNOSIS — R351 Nocturia: Secondary | ICD-10-CM | POA: Diagnosis not present

## 2020-04-13 ENCOUNTER — Telehealth: Payer: Self-pay | Admitting: Gastroenterology

## 2020-04-13 NOTE — Telephone Encounter (Addendum)
Patient calling to cancel the follow up appointment. He states he did not know he had missed our calls. See the MRI report of 04/01/20. Patient reports he is feeling better from a GI standpoint. He reports improvement in his appetite. He is not interested in a follow up appointment at this time. No abdominal pain, indigestion or bowel concerns.  He does agree to call us if he has any further GI issues. At this time he is more interested in finding out why he gets out of breath with ambulation. Appointment cancelled per his request.  Thanks

## 2020-04-17 NOTE — Telephone Encounter (Signed)
Dr. Ardis Hughs, [place] see msg below from patient. He did not wish to schedule a follow up appt with you in 2 months as recommended. Refer to abd MRI notes.

## 2020-05-20 NOTE — Patient Instructions (Addendum)
Health Maintenance Due  Topic Date Due  . TETANUS/TDAP Cheaper to do at your local pharmacy. 01/29/2020   Team please contact [facility]-we need a copy of their notes about renal mass.  We do not need a release of information as we referred him and have yet to receive information yet.  Team also complete EKG today under dyspnea on exertion. If any substantial changes we will change cardiology referral to urgent. If he has worsening symptoms prior to his visit should call 911 or go to ER  We will call you within two weeks about your referral to cardiology. If you do not hear within 3 weeks, give [place] a call.   Please stop by lab before you go If you have mychart- we will send your results within 3 business days of [place] receiving them.  If you do not have mychart- we will call you about results within 5 business days of [place] receiving them.  *please note we are currently using [facility] which has a longer processing time than [place] typically so labs may not come back as quickly as in the past *please also note that you will see labs on mychart as soon as they post. I will later go in and write notes on them- will say "notes from Dr. Yong Channel"   Please go to [place]  [facility] (updated 09/05/2019) - located [address] across the street from [place] - in the basement - Hours: 8:30-5:00 PM M-F (with lunch from 12:30- 1 PM). You do NOT need an appointment.  - Please ensure you are covid symptom free before going in(No fever, chills, cough, congestion, runny nose, shortness of breath, fatigue, body aches, sore throat, headache, nausea, vomiting, diarrhea, or new loss of taste or smell. No known contacts with covid 19 or someone being tested for covid 19)

## 2020-05-20 NOTE — Progress Notes (Addendum)
Phone [phone] In person visit   Subjective:   Isaiah Davidson is a 86 y.o. year old very pleasant male patient who presents for/with See problem oriented charting Chief Complaint  Patient presents with  . Hyperlipidemia  . Hypertension   This visit occurred during the SARS-CoV-2 public health emergency.  Safety protocols were in place, including screening questions prior to the visit, additional usage of staff PPE, and extensive cleaning of exam room while observing appropriate contact time as indicated for disinfecting solutions.   Past Medical History-  Patient Active Problem List   Diagnosis Date Noted  . Renal mass 05/22/2020    Priority: High  . MCI (mild cognitive impairment) with memory loss 05/14/2014    Priority: High  . Seizure disorder ([place]) 04/19/2011    Priority: High  . Aortic atherosclerosis ([place]) 08/05/2016    Priority: Medium  . 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 DOE (dyspnea on exertion) 10/12/2012    Priority: Medium  . Hyperlipidemia 02/22/2007    Priority: Medium  . Essential hypertension 02/22/2007    Priority: Medium  . Status post cholecystectomy 06/15/2018    Priority: Low  . Lipoma 02/02/2017    Priority: Low  . Finger infection 06/11/2014    Priority: Low  . Carpal tunnel syndrome 08/18/2010    Priority: Low  . Cyst and pseudocyst of pancreas 04/12/2010    Priority: Low  . Chronic pancreatitis ([place]) 05/22/2020  . Diarrhea 07/09/2018    Medications- reviewed and updated Current Outpatient Medications  Medication Sig Dispense Refill  . allopurinol (ZYLOPRIM) 100 MG tablet Take 1 tablet (100 mg total) by mouth daily. 90 tablet 3  . amLODipine (NORVASC) 10 MG tablet Take 1 tablet (10 mg total) by mouth daily. 90 tablet 3  . chlorthalidone (HYGROTON) 25 MG tablet Take 1 tablet (25 mg total) by mouth daily. 90 tablet 3  . donepezil (ARICEPT) 10 MG tablet Take 1 tablet (10 mg total) by mouth at bedtime. 90 tablet 3   . levETIRAcetam (KEPPRA) 500 MG tablet Take 1 tablet (500 mg total) by mouth 2 (two) times daily. 180 tablet 3  . lipase/protease/amylase (CREON) 36000 UNITS CPEP capsule Take 2 capsules (72,000 Units total) by mouth 3 (three) times daily with meals. May also take 1 capsule (36,000 Units total) as needed (with snacks). 240 capsule 2  . Multiple Vitamin (MULTIVITAMIN) capsule Take 1 capsule by mouth daily.      . pantoprazole (PROTONIX) 20 MG tablet Take 1 tablet (20 mg total) by mouth daily. 30 tablet 0  . rosuvastatin (CRESTOR) 20 MG tablet Take 1 tablet (20 mg total) by mouth daily. 90 tablet 3   No current facility-administered medications for this visit.     Objective:  BP 128/72   Pulse 65   Temp 97.7 F (36.5 C) (Temporal)   Resp 18   Ht 5\' 9"  (1.753 m)   Wt 161 lb (73 kg)   SpO2 96%   BMI 23.78 kg/m  Gen: NAD, resting comfortably CV: RRR no murmurs rubs or gallops Lungs: CTAB no crackles, wheeze, rhonchi Abdomen: soft/nontender/nondistended/normal bowel sounds. No rebound or guarding.  Ext: no edema Skin: warm, dry Neuro: able to answer most questoins  EKG: sinus rhythm with rate 60, normal axis, normal intervals, no hypertrophy, no st or t wave changes but does have q wave in v1 unchanged and new q wave in v2 though q wave in v3 no longer present (makes me wonder if  leades were inverted on our ekg today but patient gone when I was reading EKG so unable to retest).      Assessment and Plan   #Chronic pancreatitis-noted on MRI done by GI.  He was placed on pancreatic enzymes but he did not take these- abdominal pain has improved substantially without intervention. He cancelled GI follow up and wants to focus on SOB issues as feels better from GI perspective. Prior weight loss stabilized and has actually gained weight.   # Fatigue with chest pain/shortness of breath with activity-  fatigue has not improved since treating pancreatitis.   Working outside for 5 minutes gets  a pain across his chest and into his back and feels short of breath like he has been running for 30 minutes. Resting really helps. If he doesn't sit down he may get lightheaded and blurry vision. Thinks this started in spring and has steadily worsening.   -has seen Cardiology several years ago- myoview stress test, cardiopulmonary stress test prior to 2015 then echo in 2018- tricuspid regurg only -feels like symptoms have worsened in last 6 monthsand we opted to refer back to cardiology- ekg appears largely stable- possible prior MI based off q waves - with fatigue does have slight anemia but I dont think that would be cause. llast visit had slight leukopenia- will trend this and if worsens could consider oncology visit with ongoing fatigue but with cp/sob portion start with cardiology unless significant concerns.  -will also get chest x-ray at [facility]  #Renal mass-noted on MRI of abdomen-referred to urology and patient reports -he has seen urology but they suggest annual follow up instead of resection due to his age.   #hypertension S: medication: Amlodipine 10Mg , chlorthalidone 25 mg BP Readings from Last 3 Encounters:  05/22/20 128/72  03/23/20 122/78  02/06/20 126/82  A/P: well controlled- continue current meds -if sodium still low I may reduce chlorthalidone to 12.5 mg  #hyperlipidemia/aortic atherosclerosis  S: Medication: Rosuvastatin 20mg  increased from atorvastatin 20 mg last visit Lab Results  Component Value Date   CHOL 211 (H) 11/19/2019   HDL 55.00 11/19/2019   LDLCALC 122 (H) 11/19/2019   LDLDIRECT 100.0 04/01/2019   TRIG 172.0 (H) 11/19/2019   CHOLHDL 4 11/19/2019   A/P: Hopefully improved control-update direct LDL today  #Gout-no flareups on allopurinol 100 mg.  Continue current medication   #Mild cognitive impairment with memory loss- #Seizure disorder-follows with neurology for memory loss and seizure disorder  -No recent worsening of memory issues.  Stable-continue  Aricept. Reports appointment on the 29th.  -For Keppra continue current dose as listed above 500 mg twice daily  Recommended follow up: Return in about 6 months (around 11/19/2020) for physical or sooner if needed. Future Appointments  Date Time Provider [place]  06/08/2020  9:00 AM Ward Givens, NP [facility] None    Lab/Order associations:   ICD-10-CM   1. DOE (dyspnea on exertion)  R06.00 Ambulatory referral to Cardiology  2. Fatigue, unspecified type  R53.83   3. Essential hypertension  I10   4. Hyperlipidemia, unspecified hyperlipidemia type  E78.5 CBC With Differential/Platelet    COMPLETE METABOLIC PANEL WITH GFR    LDL cholesterol, direct  5. Chronic pancreatitis, unspecified pancreatitis type ([place])  K86.1   6. Renal mass  N28.89    Return precautions advised.  [name], MD

## 2020-05-22 ENCOUNTER — Encounter: Payer: Self-pay | Admitting: Family Medicine

## 2020-05-22 ENCOUNTER — Other Ambulatory Visit: Payer: Self-pay

## 2020-05-22 ENCOUNTER — Ambulatory Visit (INDEPENDENT_AMBULATORY_CARE_PROVIDER_SITE_OTHER): Payer: Medicare HMO | Admitting: Family Medicine

## 2020-05-22 ENCOUNTER — Ambulatory Visit (INDEPENDENT_AMBULATORY_CARE_PROVIDER_SITE_OTHER)
Admission: RE | Admit: 2020-05-22 | Discharge: 2020-05-22 | Disposition: A | Discharge status: Home / Self Care | Payer: Medicare HMO | Source: Ambulatory Visit | Attending: Family Medicine | Admitting: Family Medicine

## 2020-05-22 VITALS — BP 128/72 | HR 65 | Temp 97.7°F | Resp 18 | Ht 69.0 in | Wt 161.0 lb

## 2020-05-22 DIAGNOSIS — I10 Essential (primary) hypertension: Secondary | ICD-10-CM

## 2020-05-22 DIAGNOSIS — R5383 Other fatigue: Secondary | ICD-10-CM | POA: Diagnosis not present

## 2020-05-22 DIAGNOSIS — K861 Other chronic pancreatitis: Secondary | ICD-10-CM

## 2020-05-22 DIAGNOSIS — R0600 Dyspnea, unspecified: Secondary | ICD-10-CM | POA: Diagnosis not present

## 2020-05-22 DIAGNOSIS — E785 Hyperlipidemia, unspecified: Secondary | ICD-10-CM

## 2020-05-22 DIAGNOSIS — R0609 Other forms of dyspnea: Secondary | ICD-10-CM

## 2020-05-22 DIAGNOSIS — N2889 Other specified disorders of kidney and ureter: Secondary | ICD-10-CM | POA: Diagnosis not present

## 2020-05-22 NOTE — Assessment & Plan Note (Signed)
#  Renal mass-noted on MRI of abdomen-referred to urology and patient reports -he has seen urology but they suggest annual follow up instead of resection due to his age.

## 2020-05-22 NOTE — Addendum Note (Signed)
Addended by: Thomes Cake on: 05/22/2020 10:28 AM   Modules accepted: Orders

## 2020-05-22 NOTE — Assessment & Plan Note (Signed)
#   Fatigue with chest pain/shortness of breath with activity-  fatigue has not improved since treating pancreatitis. Working outside for 5 minutes gets a pain across his chest and into his back and feels short of breath like he has been running for 30 minutes. Resting really helps. If he doesn't sit down he may get lightheaded and blurry vision.  -has seen Cardiology several years ago- myoview stress test, cardiopulmonary stress test prior to 2015 then echo in 2018- tricuspid regurg only -feels like symptoms have worsened and we opted to refer back to cardiology- also may need cardiac clearance if needs surgery for kidney

## 2020-05-23 LAB — COMPLETE METABOLIC PANEL WITH GFR
AG Ratio: 1.2 (calc) (ref 1.0–2.5)
ALT: 20 U/L (ref 9–46)
AST: 27 U/L (ref 10–35)
Albumin: 4.1 g/dL (ref 3.6–5.1)
Alkaline phosphatase (APISO): 107 U/L (ref 35–144)
BUN/Creatinine Ratio: 12 (calc) (ref 6–22)
BUN: 18 mg/dL (ref 7–25)
CO2: 24 mmol/L (ref 20–32)
Calcium: 9.9 mg/dL (ref 8.6–10.3)
Chloride: 97 mmol/L — ABNORMAL LOW (ref 98–110)
Creat: 1.49 mg/dL — ABNORMAL HIGH (ref 0.70–1.11)
GFR, Est African American: 49 mL/min/{1.73_m2} — ABNORMAL LOW (ref >=60)
GFR, Est Non African American: 42 mL/min/{1.73_m2} — ABNORMAL LOW (ref >=60)
Globulin: 3.4 g/dL (calc) (ref 1.9–3.7)
Glucose, Bld: 84 mg/dL (ref 65–99)
Potassium: 4.8 mmol/L (ref 3.5–5.3)
Sodium: 137 mmol/L (ref 135–146)
Total Bilirubin: 0.6 mg/dL (ref 0.2–1.2)
Total Protein: 7.5 g/dL (ref 6.1–8.1)

## 2020-05-23 LAB — CBC WITH DIFFERENTIAL/PLATELET
Absolute Monocytes: 608 cells/uL (ref 200–950)
Basophils Absolute: 20 cells/uL (ref 0–200)
Basophils Relative: 0.5 %
Eosinophils Absolute: 59 cells/uL (ref 15–500)
Eosinophils Relative: 1.5 %
HCT: 36.1 % — ABNORMAL LOW (ref 38.5–50.0)
Hemoglobin: 12.3 g/dL — ABNORMAL LOW (ref 13.2–17.1)
Lymphs Abs: 1264 cells/uL (ref 850–3900)
MCH: 31.5 pg (ref 27.0–33.0)
MCHC: 34.1 g/dL (ref 32.0–36.0)
MCV: 92.6 fL (ref 80.0–100.0)
MPV: 10.6 fL (ref 7.5–12.5)
Monocytes Relative: 15.6 %
Neutro Abs: 1950 cells/uL (ref 1500–7800)
Neutrophils Relative %: 50 %
Platelets: 228 10*3/uL (ref 140–400)
RBC: 3.9 10*6/uL — ABNORMAL LOW (ref 4.20–5.80)
RDW: 13 % (ref 11.0–15.0)
Total Lymphocyte: 32.4 %
WBC: 3.9 10*3/uL (ref 3.8–10.8)

## 2020-05-23 LAB — LDL CHOLESTEROL, DIRECT: Direct LDL: 98 mg/dL (ref <100)

## 2020-05-26 ENCOUNTER — Other Ambulatory Visit: Payer: Self-pay

## 2020-05-26 ENCOUNTER — Ambulatory Visit: Payer: Medicare HMO | Admitting: Internal Medicine

## 2020-05-26 ENCOUNTER — Encounter: Payer: Self-pay | Admitting: Internal Medicine

## 2020-05-26 VITALS — BP 152/82 | HR 70 | Ht 69.0 in | Wt 162.4 lb

## 2020-05-26 DIAGNOSIS — I209 Angina pectoris, unspecified: Secondary | ICD-10-CM

## 2020-05-26 DIAGNOSIS — I10 Essential (primary) hypertension: Secondary | ICD-10-CM | POA: Diagnosis not present

## 2020-05-26 DIAGNOSIS — I361 Nonrheumatic tricuspid (valve) insufficiency: Secondary | ICD-10-CM | POA: Diagnosis not present

## 2020-05-26 DIAGNOSIS — R072 Precordial pain: Secondary | ICD-10-CM | POA: Diagnosis not present

## 2020-05-26 DIAGNOSIS — E785 Hyperlipidemia, unspecified: Secondary | ICD-10-CM

## 2020-05-26 DIAGNOSIS — G5603 Carpal tunnel syndrome, bilateral upper limbs: Secondary | ICD-10-CM

## 2020-05-26 MED ORDER — CARVEDILOL 6.25 MG PO TABS
6.2500 mg | ORAL_TABLET | Freq: Two times a day (BID) | ORAL | 3 refills | Status: AC
Start: 2020-05-26 — End: ?

## 2020-05-26 NOTE — Patient Instructions (Addendum)
Medication Instructions:  Start Carvedilol 6.25 mg twice a day   *If you need a refill on your cardiac medications before your next appointment, please call your pharmacy*   Lab Work: Your physician recommends that you return for lab work 1 week before your CT  If you have labs (blood work) drawn today and your tests are completely normal, you will receive your results only by: [name] Kitchen MyChart Message (if you have MyChart) OR . A paper copy in the mail If you have any lab test that is abnormal or we need to change your treatment, we will call you to review the results.   Testing/Procedures: Your physician has ordered for you to have a cardiac ct    Follow-Up: At [facility], you and your health needs are our priority.  As part of our continuing mission to provide you with exceptional heart care, we have created designated Provider Care Teams.  These Care Teams include your primary Cardiologist (physician) and Advanced Practice Providers (APPs -  Physician Assistants and Nurse Practitioners) who all work together to provide you with the care you need, when you need it.  We recommend signing up for the patient portal called "MyChart".  Sign up information is provided on this After Visit Summary.  MyChart is used to connect with patients for Virtual Visits (Telemedicine).  Patients are able to view lab/test results, encounter notes, upcoming appointments, etc.  Non-urgent messages can be sent to your provider as well.   To learn more about what you can do with MyChart, go to [email or web address].    Your next appointment:   3 month(s)  The format for your next appointment:   In Person  Provider:   Riley Lam, MD   Other Instructions Your cardiac CT will be scheduled at one of the below locations:   [facility] [address] [place], [place] [address] [phone]   If scheduled at [facility], please arrive at the [facility] main entrance of  [facility] 30 minutes prior to test start time. Proceed to the [facility] Radiology Department (first floor) to check-in and test prep.   Please follow these instructions carefully (unless otherwise directed):  Hold all erectile dysfunction medications at least 3 days (72 hrs) prior to test.  On the Night Before the Test: . Be sure to Drink plenty of water. . Do not consume any caffeinated/decaffeinated beverages or chocolate 12 hours prior to your test. . Do not take any antihistamines 12 hours prior to your test.  On the Day of the Test: . Drink plenty of water. Do not drink any water within one hour of the test. . Do not eat any food 4 hours prior to the test. . You may take your regular medications prior to the test.  . Take Carvedilol 2 hours before your CT        After the Test: . Drink plenty of water. . After receiving IV contrast, you may experience a mild flushed feeling. This is normal. . On occasion, you may experience a mild rash up to 24 hours after the test. This is not dangerous. If this occurs, you can take Benadryl 25 mg and increase your fluid intake. . If you experience trouble breathing, this can be serious. If it is severe call 911 IMMEDIATELY. If it is mild, please call our office.  Once we have confirmed authorization from your insurance company, we will call you to set up a date and time  for your test. Based on how quickly your insurance processes prior authorizations requests, please allow up to 4 weeks to be contacted for scheduling your Cardiac CT appointment. Be advised that routine Cardiac CT appointments could be scheduled as many as 8 weeks after your provider has ordered it.  For non-scheduling related questions, please contact the cardiac imaging nurse navigator should you have any questions/concerns: Marchia Bond, Cardiac Imaging Nurse Navigator Burley Saver, Interim Cardiac Imaging Nurse [place] [facility] Direct  Office Dial: [phone]   For scheduling needs, including cancellations and rescheduling, please call [place], [phone] (temporary number).

## 2020-05-26 NOTE — Progress Notes (Signed)
Cardiology Office Note:    Date:  05/26/2020   ID:  Isaiah Davidson, DOB [birth year], MRN [id number]  PCP:  Marin Olp, MD  [place] Cardiologist:  No primary care provider on file.  [facility] Electrophysiologist:  None   CC:  SOB Consulted for the evaluation of DOE at the behest of Marin Olp, MD  History of Present Illness:    Isaiah Davidson is a 86 y.o. male with a hx of HTN, HLD, Aortic Atherosclerosis, Carpal Tunnel Syndrome, Mild Cognitive Impairment on Donepezil, who presents for evaluation.  Patient notes shortness of breath.  After 25-30 feet, patient has chest pain.  Has to stop and rest.  Notes associated weakness that resolves with rest.  Feels near syncopal.  No chest tightness, chest stinging, chest burning. No radiation in pain from arm, neck, back, should or jaw. Notes this for the past year, but it is getting worse.  Never felt this way in the past.  No weight gain.  No leg swelling or abdominal swelling.  No resting SOB, No PND or orthopnea.  No syncope.  Ambulatory BP  130/70  Past Medical History:  Diagnosis Date  . Heart murmur   . HYPERLIPIDEMIA 02/22/2007  . HYPERTENSION 02/22/2007  . MCI (mild cognitive impairment) with memory loss 05/14/2014  . Seizures ([place])    treated for siezures around 2010    Past Surgical History:  Procedure Laterality Date  . CARPAL TUNNEL RELEASE Bilateral   . CHOLECYSTECTOMY N/A 06/15/2018   Procedure: LAPAROSCOPIC CHOLECYSTECTOMY;  Surgeon: Mickeal Skinner, MD;  Location: [place];  Service: General;  Laterality: N/A;  . KNEE ARTHROSCOPY Right     Current Medications: Current Meds  Medication Sig  . allopurinol (ZYLOPRIM) 100 MG tablet Take 1 tablet (100 mg total) by mouth daily.  [name] Kitchen amLODipine (NORVASC) 10 MG tablet Take 1 tablet (10 mg total) by mouth daily.  . chlorthalidone (HYGROTON) 25 MG tablet Take 1 tablet (25 mg total) by mouth daily.  [name] Kitchen donepezil (ARICEPT) 10 MG tablet Take 1  tablet (10 mg total) by mouth at bedtime.  . levETIRAcetam (KEPPRA) 500 MG tablet Take 1 tablet (500 mg total) by mouth 2 (two) times daily.  . Multiple Vitamin (MULTIVITAMIN) capsule Take 1 capsule by mouth daily.    . rosuvastatin (CRESTOR) 20 MG tablet Take 1 tablet (20 mg total) by mouth daily.     Allergies:   Patient has no known allergies.   Social History   Socioeconomic History  . Marital status: Married    Spouse name: [name]  . Number of children: 2  . Years of education: 10  . Highest education level: Not on file  Occupational History    Employer: RETIRED  Tobacco Use  . Smoking status: Former Smoker    Packs/day: 1.00    Years: 30.00    Pack years: 30.00    Types: Cigars, Cigarettes    Quit date: 07/11/1978    Years since quitting: 41.9  . Smokeless tobacco: Never Used  Vaping Use  . Vaping Use: Never used  Substance and Sexual Activity  . Alcohol use: Not Currently    Alcohol/week: 0.0 standard drinks  . Drug use: Yes    Types: Benzodiazepines  . Sexual activity: Not Currently  Other Topics Concern  . Not on file  Social History Narrative   Patient is married ([name]).   Patient has 3 children. 2 grandkids. 3 greatgrandkids.    Patient is retired truck  driver.    Patient is right-handed.   Patient has a 10th grade education.   Patient drinks one cup of coffee daily.      Hobbies: working in the yard- gardening competition with neighbor on who can grow best vegetables, "messing around the house"      07/21/14- wants wife to make decisions for him. Not sure if he has advanced directives.       Social Determinants of Health   Financial Resource Strain:   . Difficulty of Paying Living Expenses: Not on file  Food Insecurity:   . Worried About Charity fundraiser in the Last Year: Not on file  . Ran Out of Food in the Last Year: Not on file  Transportation Needs:   . Lack of Transportation (Medical): Not on file  . Lack of Transportation  (Non-Medical): Not on file  Physical Activity:   . Days of Exercise per Week: Not on file  . Minutes of Exercise per Session: Not on file  Stress:   . Feeling of Stress : Not on file  Social Connections:   . Frequency of Communication with Friends and Family: Not on file  . Frequency of Social Gatherings with Friends and Family: Not on file  . Attends Religious Services: Not on file  . Active Member of Clubs or Organizations: Not on file  . Attends Archivist Meetings: Not on file  . Marital Status: Not on file     Family History: The patient's family history includes Diabetes in his paternal grandmother.  ROS:   Please see the history of present illness.    All other systems reviewed and are negative.  EKGs/Labs/Other Studies Reviewed:    The following studies were reviewed today:  EKG:   05/22/20 Sinus 60; full criteria for anteroseptal infarct not met  Recent Labs: 01/15/2020: TSH 1.67 05/22/2020: ALT 20; BUN 18; Creat 1.49; Hemoglobin 12.3; Platelets 228; Potassium 4.8; Sodium 137  Recent Lipid Panel    Component Value Date/Time   CHOL 211 (H) 11/19/2019 1023   TRIG 172.0 (H) 11/19/2019 1023   TRIG 104 06/20/2006 0930   HDL 55.00 11/19/2019 1023   CHOLHDL 4 11/19/2019 1023   VLDL 34.4 11/19/2019 1023   LDLCALC 122 (H) 11/19/2019 1023   LDLDIRECT 98 05/22/2020 1039   12/18 Echo- severe LHV without LA enlargement Study Conclusions   - Left ventricle: The cavity size was normal. Wall thickness was  increased in a pattern of severe LVH. Systolic function was  normal. The estimated ejection fraction was in the range of 60%  to 65%. Wall motion was normal; there were no regional wall  motion abnormalities. Doppler parameters are consistent with  abnormal left ventricular relaxation (grade 1 diastolic  dysfunction).  - Tricuspid valve: There was mild-moderate regurgitation.   Physical Exam:    VS:  BP (!) 152/82   Pulse 70   Ht '5\' 9"'  (1.753  m)   Wt 162 lb 6.4 oz (73.7 kg)   SpO2 97%   BMI 23.98 kg/m     Wt Readings from Last 3 Encounters:  05/26/20 162 lb 6.4 oz (73.7 kg)  05/22/20 161 lb (73 kg)  03/23/20 158 lb (71.7 kg)     GEN: Well nourished, well developed in no acute distress HEENT: Normal NECK: No JVD; No carotid bruits LYMPHATICS: No lymphadenopathy CARDIAC: RRR,holosytolic 2/6 murmur, rubs, gallops RESPIRATORY:  Clear to auscultation without rales, wheezing or rhonchi  ABDOMEN: Soft, non-tender, non-distended MUSCULOSKELETAL:  No edema; No deformity  SKIN: Warm and dry NEUROLOGIC:  Alert and oriented x 3 PSYCHIATRIC:  Normal affect   ASSESSMENT:    1. Angina pectoris ([place])   2. Nonrheumatic tricuspid valve regurgitation   3. Bilateral carpal tunnel syndrome   4. Hyperlipidemia, unspecified hyperlipidemia type   5. Essential hypertension   6. Precordial pain    PLAN:    In order of problems listed above:  Angina Mild to Moderate TR Severe LVH, without LA enlargement Carpal Tunnel Surgery HTN HLD Mild Cognitive Impairment - Coreg 6.25 mG BID - SHARED DECISION MAKING- Discussed at length the goals of care for the patient.  HE is AOx3-> he wants answer to why he has change pain even if it means that he may not proceed to [facility] (unsure at this time).  Has been on Aricept for 10 years and his mentation is stable.  After discussing ASA, IMDUR, PRN Nitro, Echo for TR followup, BNP, and BMP; patient notes that every test has cost.  After shared decision making with him and his wife, will start coreg and to CCTA +/- FFR.  Based on these results, will discussing further testing and treatment as appropriate.  Greater than 20 minutes dedicated to this conversation alone.  3 months follow up unless new symptoms or abnormal test results warranting change in plan   Shared Decision Making/Informed Consent        Medication Adjustments/Labs and Tests Ordered: Current medicines are reviewed at length with the  patient today.  Concerns regarding medicines are outlined above.  Orders Placed This Encounter  Procedures  . CT CORONARY MORPH W/CTA COR W/SCORE W/CA W/CM &/OR WO/CM  . CT CORONARY FRACTIONAL FLOW RESERVE DATA PREP  . CT CORONARY FRACTIONAL FLOW RESERVE FLUID ANALYSIS  . Basic metabolic panel   Meds ordered this encounter  Medications  . carvedilol (COREG) 6.25 MG tablet    Sig: Take 1 tablet (6.25 mg total) by mouth 2 (two) times daily.    Dispense:  180 tablet    Refill:  3    Patient Instructions  Medication Instructions:  Start Carvedilol 6.25 mg twice a day   *If you need a refill on your cardiac medications before your next appointment, please call your pharmacy*   Lab Work: Your physician recommends that you return for lab work 1 week before your CT  If you have labs (blood work) drawn today and your tests are completely normal, you will receive your results only by: [name] Kitchen MyChart Message (if you have MyChart) OR . A paper copy in the mail If you have any lab test that is abnormal or we need to change your treatment, we will call you to review the results.   Testing/Procedures: Your physician has ordered for you to have a cardiac ct    Follow-Up: At [facility], you and your health needs are our priority.  As part of our continuing mission to provide you with exceptional heart care, we have created designated Provider Care Teams.  These Care Teams include your primary Cardiologist (physician) and Advanced Practice Providers (APPs -  Physician Assistants and Nurse Practitioners) who all work together to provide you with the care you need, when you need it.  We recommend signing up for the patient portal called "MyChart".  Sign up information is provided on this After Visit Summary.  MyChart is used to connect with patients for Virtual Visits (Telemedicine).  Patients are able to view lab/test results, encounter notes, upcoming appointments,  etc.  Non-urgent messages can  be sent to your provider as well.   To learn more about what you can do with MyChart, go to [email or web address].    Your next appointment:   3 month(s)  The format for your next appointment:   In Person  Provider:   Rudean Haskell, MD   Other Instructions Your cardiac CT will be scheduled at one of the below locations:   [facility] [address] [place], [place] [address] [phone]   If scheduled at [facility], please arrive at the [facility] main entrance of [facility] 30 minutes prior to test start time. Proceed to the [facility] Radiology Department (first floor) to check-in and test prep.   Please follow these instructions carefully (unless otherwise directed):  Hold all erectile dysfunction medications at least 3 days (72 hrs) prior to test.  On the Night Before the Test: . Be sure to Drink plenty of water. . Do not consume any caffeinated/decaffeinated beverages or chocolate 12 hours prior to your test. . Do not take any antihistamines 12 hours prior to your test.  On the Day of the Test: . Drink plenty of water. Do not drink any water within one hour of the test. . Do not eat any food 4 hours prior to the test. . You may take your regular medications prior to the test.  . Take Carvedilol 2 hours before your CT        After the Test: . Drink plenty of water. . After receiving IV contrast, you may experience a mild flushed feeling. This is normal. . On occasion, you may experience a mild rash up to 24 hours after the test. This is not dangerous. If this occurs, you can take Benadryl 25 mg and increase your fluid intake. . If you experience trouble breathing, this can be serious. If it is severe call 911 IMMEDIATELY. If it is mild, please call our office.  Once we have confirmed authorization from your insurance company, we will call you to set up a date and time for your test. Based on how quickly your insurance  processes prior authorizations requests, please allow up to 4 weeks to be contacted for scheduling your Cardiac CT appointment. Be advised that routine Cardiac CT appointments could be scheduled as many as 8 weeks after your provider has ordered it.  For non-scheduling related questions, please contact the cardiac imaging nurse navigator should you have any questions/concerns: Marchia Bond, Cardiac Imaging Nurse Navigator Burley Saver, Interim Cardiac Imaging Nurse [place] [facility] Direct Office Dial: [phone]   For scheduling needs, including cancellations and rescheduling, please call [place], [phone] (temporary number).        Signed, Werner Lean, MD  05/26/2020 3:58 PM    [place]

## 2020-06-07 NOTE — Progress Notes (Signed)
PATIENT: Isaiah Davidson DOB: [birth year]  REASON FOR VISIT: follow up HISTORY FROM: patient  HISTORY OF PRESENT ILLNESS: Today 06/07/20:  Isaiah Davidson is an 86 year old male with a history of seizures and memory disturbance.  He returns today for follow-up.  He remains on Keppra 500 mg twice a day.  Reports that he tolerates his medication well.  Denies any seizure events.  He feels that his memory has remained relatively stable.  He lives at home with his wife.  He is able to complete all ADLs independently.  Operates a motor vehicle without difficulty.  He manages his own medications, appointments.  His wife manages the finances.  He returns today for an evaluation  06/04/19: Isaiah Davidson is an 86 year old male with a history of seizures and memory disturbance.  He returns today for follow-up.  He remains on Keppra 500 mg twice a day for seizures.  He denies any seizure events.  Denies any changes with his mood or behavior.  He remains on Aricept 10 mg at bedtime.  He reports that his memory has remained stable.  He is able to complete all ADLs independently.  He continues to operate a motor vehicle without difficulty.  He lives at home with his wife.  He returns today for an evaluation.  HISTORY 11/27/18:  Isaiah Davidson is an 86 year old male with a history of memory disturbance and seizures.  He returns today for follow-up.  He reports that his memory has remained stable.  He is able to complete all ADLs independently.  He operates a Teacher, music without difficulty.  Reports that he does some cooking without difficulty.  Reports good appetite.  No change in his mood or behavior.  He continues on Keppra 500 mg twice a day for seizures.  Denies any seizure events.  At the last visit he was started on Aricept 5 mg at bedtime for memory.  Reports that he has tolerated this well.  REVIEW OF SYSTEMS: Out of a complete 14 system review of symptoms, the patient complains only of the following  symptoms, and all other reviewed systems are negative.  See HPI  ALLERGIES: No Known Allergies  HOME MEDICATIONS: Outpatient Medications Prior to Visit  Medication Sig Dispense Refill  . allopurinol (ZYLOPRIM) 100 MG tablet Take 1 tablet (100 mg total) by mouth daily. 90 tablet 3  . amLODipine (NORVASC) 10 MG tablet Take 1 tablet (10 mg total) by mouth daily. 90 tablet 3  . carvedilol (COREG) 6.25 MG tablet Take 1 tablet (6.25 mg total) by mouth 2 (two) times daily. 180 tablet 3  . chlorthalidone (HYGROTON) 25 MG tablet Take 1 tablet (25 mg total) by mouth daily. 90 tablet 3  . donepezil (ARICEPT) 10 MG tablet Take 1 tablet (10 mg total) by mouth at bedtime. 90 tablet 3  . levETIRAcetam (KEPPRA) 500 MG tablet Take 1 tablet (500 mg total) by mouth 2 (two) times daily. 180 tablet 3  . Multiple Vitamin (MULTIVITAMIN) capsule Take 1 capsule by mouth daily.      . rosuvastatin (CRESTOR) 20 MG tablet Take 1 tablet (20 mg total) by mouth daily. 90 tablet 3   No facility-administered medications prior to visit.    PAST MEDICAL HISTORY: Past Medical History:  Diagnosis Date  . Heart murmur   . HYPERLIPIDEMIA 02/22/2007  . HYPERTENSION 02/22/2007  . MCI (mild cognitive impairment) with memory loss 05/14/2014  . Seizures ([place])    treated for siezures around 2010    PAST  SURGICAL HISTORY: Past Surgical History:  Procedure Laterality Date  . CARPAL TUNNEL RELEASE Bilateral   . CHOLECYSTECTOMY N/A 06/15/2018   Procedure: LAPAROSCOPIC CHOLECYSTECTOMY;  Surgeon: Kinsinger, Arta Bruce, MD;  Location: [place];  Service: General;  Laterality: N/A;  . KNEE ARTHROSCOPY Right     FAMILY HISTORY: Family History  Problem Relation Age of Onset  . Diabetes Paternal Grandmother        raised by grandparents    SOCIAL HISTORY: Social History   Socioeconomic History  . Marital status: Married    Spouse name: [name]  . Number of children: 2  . Years of education: 10  . Highest education level:  Not on file  Occupational History    Employer: RETIRED  Tobacco Use  . Smoking status: Former Smoker    Packs/day: 1.00    Years: 30.00    Pack years: 30.00    Types: Cigars, Cigarettes    Quit date: 07/11/1978    Years since quitting: 41.9  . Smokeless tobacco: Never Used  Vaping Use  . Vaping Use: Never used  Substance and Sexual Activity  . Alcohol use: Not Currently    Alcohol/week: 0.0 standard drinks  . Drug use: Yes    Types: Benzodiazepines  . Sexual activity: Not Currently  Other Topics Concern  . Not on file  Social History Narrative   Patient is married ([name]).   Patient has 3 children. 2 grandkids. 3 greatgrandkids.    Patient is retired Administrator.    Patient is right-handed.   Patient has a 10th grade education.   Patient drinks one cup of coffee daily.      Hobbies: working in the yard- gardening competition with neighbor on who can grow best vegetables, "messing around the house"      07/21/14- wants wife to make decisions for him. Not sure if he has advanced directives.       Social Determinants of Health   Financial Resource Strain:   . Difficulty of Paying Living Expenses: Not on file  Food Insecurity:   . Worried About Charity fundraiser in the Last Year: Not on file  . Ran Out of Food in the Last Year: Not on file  Transportation Needs:   . Lack of Transportation (Medical): Not on file  . Lack of Transportation (Non-Medical): Not on file  Physical Activity:   . Days of Exercise per Week: Not on file  . Minutes of Exercise per Session: Not on file  Stress:   . Feeling of Stress : Not on file  Social Connections:   . Frequency of Communication with Friends and Family: Not on file  . Frequency of Social Gatherings with Friends and Family: Not on file  . Attends Religious Services: Not on file  . Active Member of Clubs or Organizations: Not on file  . Attends Archivist Meetings: Not on file  . Marital Status: Not on file   Intimate Partner Violence:   . Fear of Current or Ex-Partner: Not on file  . Emotionally Abused: Not on file  . Physically Abused: Not on file  . Sexually Abused: Not on file      PHYSICAL EXAM  There were no vitals filed for this visit. There is no height or weight on file to calculate BMI.   MMSE - Mini Mental State Exam 06/04/2019 11/27/2018 11/14/2017  Not completed: (No Data) - -  Orientation to time 4 5 5   Orientation to Place 4 5  5  Registration 3 3 3   Attention/ Calculation 1 1 4   Attention/Calculation-comments - - -  Recall 2 1 3   Language- name 2 objects 2 2 2   Language- repeat 1 1 1   Language- follow 3 step command 3 3 3   Language- read & follow direction 1 1 1   Write a sentence 0 0 0  Copy design 0 0 0  Total score 21 22 27      Generalized: Well developed, in no acute distress   Neurological examination  Mentation: Alert oriented to time, place, history taking. Follows all commands speech and language fluent Cranial nerve II-XII: Pupils were equal round reactive to light. Extraocular movements were full, visual field were full on confrontational test.  Head turning and shoulder shrug  were normal and symmetric. Motor: The motor testing reveals 5 over 5 strength of all 4 extremities. Good symmetric motor tone is noted throughout.  Sensory: Sensory testing is intact to soft touch on all 4 extremities. No evidence of extinction is noted.  Coordination: Cerebellar testing reveals good finger-nose-finger and heel-to-shin bilaterally.  Gait and station: Gait is normal.    DIAGNOSTIC DATA (LABS, IMAGING, TESTING) - I reviewed patient records, labs, notes, testing and imaging myself where available.  Lab Results  Component Value Date   WBC 3.9 05/22/2020   HGB 12.3 (L) 05/22/2020   HCT 36.1 (L) 05/22/2020   MCV 92.6 05/22/2020   PLT 228 05/22/2020      Component Value Date/Time   NA 137 05/22/2020 1039   K 4.8 05/22/2020 1039   CL 97 (L) 05/22/2020 1039    CO2 24 05/22/2020 1039   GLUCOSE 84 05/22/2020 1039   BUN 18 05/22/2020 1039   CREATININE 1.49 (H) 05/22/2020 1039   CALCIUM 9.9 05/22/2020 1039   PROT 7.5 05/22/2020 1039   ALBUMIN 4.4 03/23/2020 1553   AST 27 05/22/2020 1039   ALT 20 05/22/2020 1039   ALKPHOS 119 (H) 03/23/2020 1553   BILITOT 0.6 05/22/2020 1039   GFRNONAA 42 (L) 05/22/2020 1039   GFRAA 49 (L) 05/22/2020 1039   Lab Results  Component Value Date   CHOL 211 (H) 11/19/2019   HDL 55.00 11/19/2019   LDLCALC 122 (H) 11/19/2019   LDLDIRECT 98 05/22/2020   TRIG 172.0 (H) 11/19/2019   CHOLHDL 4 11/19/2019   Lab Results  Component Value Date   HGBA1C  02/23/2009    5.6 (NOTE) The ADA recommends the following therapeutic goal for glycemic control related to Hgb A1c measurement: Goal of therapy: <6.5 Hgb A1c  Reference: American Diabetes Association: Clinical Practice Recommendations 2010, Diabetes Care, 2010, 33: (Suppl  1).   Lab Results  Component Value Date   PIRJJOAC16 606 02/06/2020   Lab Results  Component Value Date   TSH 1.67 01/15/2020      ASSESSMENT AND PLAN 86 y.o. year old male  has a past medical history of Heart murmur, HYPERLIPIDEMIA (02/22/2007), HYPERTENSION (02/22/2007), MCI (mild cognitive impairment) with memory loss (05/14/2014), and Seizures ([place]). here with:  1.  Memory disturbance  -Memory score has remained stable MMSE 21 out of 30 -Continue Aricept 10 mg at bedtime  2.  Seizures  -Reports no seizure events -Continue on Keppra 500 mg twice a day  Overall the patient has remained stable.  He is advised that if his symptoms worsen or he develops new symptoms he should let us know.  He will follow-up in 1 year or sooner if needed.     Ward Givens,  MSN, NP-C 06/08/2020, 9:36 AM [facility] [address] [place], [place] [address] [phone]

## 2020-06-08 ENCOUNTER — Encounter: Payer: Self-pay | Admitting: Adult Health

## 2020-06-08 ENCOUNTER — Other Ambulatory Visit: Payer: Self-pay

## 2020-06-08 ENCOUNTER — Ambulatory Visit: Payer: Medicare HMO | Admitting: Adult Health

## 2020-06-08 VITALS — BP 142/80 | HR 72 | Ht 69.0 in | Wt 164.4 lb

## 2020-06-08 DIAGNOSIS — R413 Other amnesia: Secondary | ICD-10-CM

## 2020-06-08 DIAGNOSIS — R569 Unspecified convulsions: Secondary | ICD-10-CM

## 2020-06-08 MED ORDER — LEVETIRACETAM 500 MG PO TABS: 500.0000 mg | ORAL_TABLET | Freq: Two times a day (BID) | ORAL | 3 refills | Status: AC

## 2020-06-08 MED ORDER — DONEPEZIL HCL 10 MG PO TABS: 10.0000 mg | ORAL_TABLET | Freq: Every day | ORAL | 3 refills | Status: AC

## 2020-06-08 NOTE — Patient Instructions (Signed)
Your Plan:  Continue Keppra for seizures Continue Aricept for memory. Memory score is stable If your symptoms worsen or you develop new symptoms please let us know.   Thank you for coming to see [place] at [facility]. I hope we have been able to provide you high quality care today.  You may receive a patient satisfaction survey over the next few weeks. We would appreciate your feedback and comments so that we may continue to improve ourselves and the health of our patients.

## 2020-06-09 ENCOUNTER — Ambulatory Visit: Payer: Medicare HMO | Admitting: Gastroenterology

## 2020-06-10 ENCOUNTER — Telehealth: Payer: Self-pay

## 2020-06-10 NOTE — Telephone Encounter (Signed)
-----   Message from Marin Olp, MD sent at 06/10/2020 10:39 AM EST ----- I believe he saw urology but we did not get notes.   From last avs "Team please contact [facility]-we need a copy of their notes about renal mass.  We do not need a release of information as we referred him and have yet to receive information yet."  Please document a phone call with information/steps taken and forward to me as FYI.   [name] ----- Message ----- From: Marin Olp, MD Sent: 05/20/2020  12:55 PM EST To: Marin Olp, MD, [facility]  Team want to make sure he saw urology.   [name] ----- Message ----- From: Marin Olp, MD Sent: 05/06/2020   1:29 PM EST To: Marin Olp, MD, [facility]  Has he been seen by urology yet? Wanted to check in  ----- Message ----- From: Thomes Cake, CMA Sent: 04/03/2020   4:25 PM EDT To: Marin Olp, MD  Urgent referral has been placed to Urology.  ----- Message ----- From: Marin Olp, MD Sent: 04/02/2020   5:14 PM EDT To: Milus Banister, MD, Nile Riggs, CMA, #  Team please place urgent urology referral under left renal cyst. In notes please state possible renal cell carcinoma. Please try to get him in within next week or two ----- Message ----- From: Noralyn Pick, NP Sent: 04/02/2020   4:27 PM EDT To: Milus Banister, MD, Marin Olp, MD  I called the patient and discussed his abd MRI/ MRCP showed dilation of the pancreatic duct up to 77mm assessed as unchanged from prior CT. Dr. Ardis Hughs, pls review these findings.  I discussed with the patient a 2.1 cm left kidney lesion was identified which requires further follow up with Dr. Yong Channel, recommend urology referral. Patient agreed to contact Dr. Yong Channel for appropriate follow up regarding the left kidney lesion/mass, concerning for renal cell ca.  Dr. Yong Channel, please let me know you received this result. Thank you.

## 2020-06-10 NOTE — Telephone Encounter (Signed)
Called and spoke with medical records at [facility] urology and she states notes were sent Oct 4,2021. She states she will resend it today.

## 2020-06-11 ENCOUNTER — Telehealth (HOSPITAL_COMMUNITY): Payer: Self-pay | Admitting: Emergency Medicine

## 2020-06-11 NOTE — Telephone Encounter (Signed)
Reaching out to patient to offer assistance regarding upcoming cardiac imaging study; pt verbalizes understanding of appt date/time, parking situation and where to check in, pre-test NPO status and medications ordered, and verified current allergies; name and call back number provided for further questions should they arise Isaiah Bond RN [facility] [phone] office [phone] cell  Spoke with wife who confirmed receipt of instructions. Isaiah Davidson

## 2020-06-12 ENCOUNTER — Encounter (HOSPITAL_COMMUNITY): Payer: Self-pay

## 2020-06-12 ENCOUNTER — Ambulatory Visit (HOSPITAL_COMMUNITY)
Admission: RE | Admit: 2020-06-12 | Discharge: 2020-06-12 | Disposition: A | Discharge status: Home / Self Care | Payer: Medicare HMO | Source: Ambulatory Visit | Attending: Internal Medicine | Admitting: Internal Medicine

## 2020-06-12 ENCOUNTER — Other Ambulatory Visit: Payer: Self-pay

## 2020-06-12 DIAGNOSIS — R072 Precordial pain: Secondary | ICD-10-CM | POA: Diagnosis not present

## 2020-06-12 DIAGNOSIS — I2510 Atherosclerotic heart disease of native coronary artery without angina pectoris: Secondary | ICD-10-CM

## 2020-06-12 MED ORDER — NITROGLYCERIN 0.4 MG SL SUBL
SUBLINGUAL_TABLET | SUBLINGUAL | Status: AC
  Administered 2020-06-12: 0.8 mg via SUBLINGUAL
  Filled 2020-06-12: qty 2

## 2020-06-12 MED ORDER — IOHEXOL 350 MG/ML SOLN
80.0000 mL | Freq: Once | INTRAVENOUS | Status: AC | PRN
  Administered 2020-06-12: 80 mL via INTRAVENOUS

## 2020-06-12 MED ORDER — NITROGLYCERIN 0.4 MG SL SUBL: 0.8000 mg | SUBLINGUAL_TABLET | Freq: Once | SUBLINGUAL | Status: AC

## 2020-06-13 ENCOUNTER — Ambulatory Visit (HOSPITAL_COMMUNITY)
Admission: RE | Admit: 2020-06-13 | Discharge: 2020-06-13 | Disposition: A | Discharge status: Home / Self Care | Payer: Medicare HMO | Source: Ambulatory Visit | Attending: Internal Medicine | Admitting: Internal Medicine

## 2020-06-13 DIAGNOSIS — R072 Precordial pain: Secondary | ICD-10-CM | POA: Diagnosis not present

## 2020-06-15 ENCOUNTER — Telehealth: Payer: Self-pay

## 2020-06-15 NOTE — Telephone Encounter (Signed)
Pt wife on DPR advised his CT results and they will return to see Dr. Gasper Sells 06/16/20.

## 2020-06-15 NOTE — Telephone Encounter (Signed)
-----   Message from Werner Lean, MD sent at 06/15/2020  7:51 AM EST ----- Results: Let's bring back Mr. Skibicki and his wife to discuss the findings of coronary artery disease.  We will talk about medication and the risks and benefits of doing a procedure.  Werner Lean, MD

## 2020-06-16 ENCOUNTER — Other Ambulatory Visit: Payer: Self-pay

## 2020-06-16 ENCOUNTER — Ambulatory Visit: Payer: Medicare HMO | Admitting: Internal Medicine

## 2020-06-16 ENCOUNTER — Encounter: Payer: Self-pay | Admitting: Internal Medicine

## 2020-06-16 VITALS — BP 180/86 | HR 60 | Ht 69.0 in | Wt 161.6 lb

## 2020-06-16 DIAGNOSIS — I10 Essential (primary) hypertension: Secondary | ICD-10-CM | POA: Diagnosis not present

## 2020-06-16 DIAGNOSIS — I25110 Atherosclerotic heart disease of native coronary artery with unstable angina pectoris: Secondary | ICD-10-CM | POA: Diagnosis not present

## 2020-06-16 DIAGNOSIS — I361 Nonrheumatic tricuspid (valve) insufficiency: Secondary | ICD-10-CM

## 2020-06-16 LAB — BASIC METABOLIC PANEL
BUN/Creatinine Ratio: 8 — ABNORMAL LOW (ref 10–24)
BUN: 12 mg/dL (ref 8–27)
CO2: 24 mmol/L (ref 20–29)
Calcium: 9.5 mg/dL (ref 8.6–10.2)
Chloride: 99 mmol/L (ref 96–106)
Creatinine, Ser: 1.42 mg/dL — ABNORMAL HIGH (ref 0.76–1.27)
GFR calc Af Amer: 51 mL/min/{1.73_m2} — ABNORMAL LOW (ref >59)
GFR calc non Af Amer: 44 mL/min/{1.73_m2} — ABNORMAL LOW (ref >59)
Glucose: 84 mg/dL (ref 65–99)
Potassium: 4.5 mmol/L (ref 3.5–5.2)
Sodium: 136 mmol/L (ref 134–144)

## 2020-06-16 MED ORDER — ASPIRIN EC 81 MG PO TBEC: 81.0000 mg | DELAYED_RELEASE_TABLET | Freq: Every day | ORAL | 3 refills | Status: AC

## 2020-06-16 MED ORDER — NITROGLYCERIN 0.4 MG SL SUBL: 0.4000 mg | SUBLINGUAL_TABLET | SUBLINGUAL | 3 refills | Status: AC | PRN

## 2020-06-16 NOTE — Patient Instructions (Addendum)
Medication Instructions:  1)  A prescription has been sent in for Nitroglycerin.  If you have chest pain that doesn't relieve quickly, place one tablet under your tongue and allow it to dissolve.  If no relief after 5 minutes, you may take another pill.  If no relief after 5 minutes, you may take a 3rd dose but you need to call 911 and report to ER immediately. 2) START Aspirin 81mg  once daily  *If you need a refill on your cardiac medications before your next appointment, please call your pharmacy*   Lab Work: BMET today  If you have labs (blood work) drawn today and your tests are completely normal, you will receive your results only by: [name] Kitchen MyChart Message (if you have MyChart) OR . A paper copy in the mail If you have any lab test that is abnormal or we need to change your treatment, we will call you to review the results.   Testing/Procedures: Your physician has requested that you have a cardiac catheterization. Cardiac catheterization is used to diagnose and/or treat various heart conditions. Doctors may recommend this procedure for a number of different reasons. The most common reason is to evaluate chest pain. Chest pain can be a symptom of coronary artery disease (CAD), and cardiac catheterization can show whether plaque is narrowing or blocking your heart's arteries. This procedure is also used to evaluate the valves, as well as measure the blood flow and oxygen levels in different parts of your heart. For further information please visit [email or web address]. Please follow instruction sheet, as given.    Follow-Up: At [facility], you and your health needs are our priority.  As part of our continuing mission to provide you with exceptional heart care, we have created designated Provider Care Teams.  These Care Teams include your primary Cardiologist (physician) and Advanced Practice Providers (APPs -  Physician Assistants and Nurse Practitioners) who all work together to provide you  with the care you need, when you need it.  We recommend signing up for the patient portal called "MyChart".  Sign up information is provided on this After Visit Summary.  MyChart is used to connect with patients for Virtual Visits (Telemedicine).  Patients are able to view lab/test results, encounter notes, upcoming appointments, etc.  Non-urgent messages can be sent to your provider as well.   To learn more about what you can do with MyChart, go to [email or web address].    Your next appointment:   2-3 week(s)  The format for your next appointment:   In Person  Provider:   You may see Dr. Rudean Haskell or one of the following Advanced Practice Providers on your designated Care Team:    Melina Copa, PA-C  Ermalinda Barrios, PA-C    Other Instructions  Due to recent COVID-19 restrictions implemented by our local and state authorities and in an effort to keep both patients and staff as safe as possible, our hospital system requires COVID-19 testing prior to certain scheduled hospital procedures.  Please go to [place]. [place], [place] [address] on Wednesday, December 8th at 10:50AM  .  This is a drive up testing site.  You will not need to exit your vehicle. You must agree to self-quarantine from the time of your testing until the procedure date on 06/17/2020.  This should included staying home with ONLY the people you live with.  Avoid take-out, grocery store shopping or leaving the house for any non-emergent reason.  Failure to have your COVID-19 test done  on the date and time you have been scheduled will result in cancellation of your procedure.  Please call our office at [phone] if you have any questions.     [place] OFFICE [place], [place] [address] Dept: [phone] Loc: [phone]  Silviano Neuser  06/16/2020  You are scheduled for a Cardiac  Catheterization on Friday, December 10 with Dr. Shelva Majestic.  1. Please arrive at the [facility] (Main Entrance A) at [facility]: [address] [place], [place] [address] at 10:00 AM (This time is two hours before your procedure to ensure your preparation). Free valet parking service is available.   Special note: Every effort is made to have your procedure done on time. Please understand that emergencies sometimes delay scheduled procedures.  2. Diet: Do not eat solid foods after midnight.  The patient may have clear liquids until 5am upon the day of the procedure.  3. Labs: You will have labs drawn today.  4. Medication instructions in preparation for your procedure:   Contrast Allergy: No  You will need to hold your Chlorthalidone the morning of your procedure.  On the morning of your procedure, take your Aspirin and any morning medicines NOT listed above.  You may use sips of water.  5. Plan for one night stay--bring personal belongings. 6. Bring a current list of your medications and current insurance cards. 7. You MUST have a responsible person to drive you home. 8. Someone MUST be with you the first 24 hours after you arrive home or your discharge will be delayed. 9. Please wear clothes that are easy to get on and off and wear slip-on shoes.  Thank you for allowing [place] to care for you!   -- [place] [facility] services

## 2020-06-16 NOTE — Progress Notes (Signed)
Cardiology Office Note:    Date:  06/16/2020   ID:  Tera Partridge, DOB Nov 12, 1931, MRN [id number]  PCP:  Marin Olp, MD  [place] Cardiologist:  No primary care provider on file.  [facility] Electrophysiologist:  None   CC:  Follow up CT Scan  History of Present Illness:    Isaiah Davidson is a 86 y.o. male with a hx of HTN, HLD, Aortic Atherosclerosis, Carpal Tunnel Syndrome, Mild Cognitive Impairment on Donepezil, who presents for 05/26/20 for Anginal equivalenet.  Patient notes that he is doing the same.  Since last visit notes stable exertional chest pain and shortness of breath changes.  Relevant interval testing includes CCTA showing multiple blockages including LAD and LCx disease.  There are no interval hospital/ED visit.    No change in his chest pain.  No resting SOB but notes DOE and no PND/Orthopnea.  No weight gain or leg swelling.  No palpitations or syncope.  Ambulatory BP  140/70  Past Medical History:  Diagnosis Date  . Heart murmur   . HYPERLIPIDEMIA 02/22/2007  . HYPERTENSION 02/22/2007  . MCI (mild cognitive impairment) with memory loss 05/14/2014  . Seizures ([place])    treated for siezures around 2010    Past Surgical History:  Procedure Laterality Date  . CARPAL TUNNEL RELEASE Bilateral   . CHOLECYSTECTOMY N/A 06/15/2018   Procedure: LAPAROSCOPIC CHOLECYSTECTOMY;  Surgeon: Mickeal Skinner, MD;  Location: [facility];  Service: General;  Laterality: N/A;  . KNEE ARTHROSCOPY Right     Current Medications: Current Meds  Medication Sig  . allopurinol (ZYLOPRIM) 100 MG tablet Take 1 tablet (100 mg total) by mouth daily.  [name] Kitchen amLODipine (NORVASC) 10 MG tablet Take 1 tablet (10 mg total) by mouth daily.  . carvedilol (COREG) 6.25 MG tablet Take 1 tablet (6.25 mg total) by mouth 2 (two) times daily.  . chlorthalidone (HYGROTON) 25 MG tablet Take 1 tablet (25 mg total) by mouth daily.  [name] Kitchen donepezil (ARICEPT) 10 MG tablet Take 1 tablet (10  mg total) by mouth at bedtime.  . levETIRAcetam (KEPPRA) 500 MG tablet Take 1 tablet (500 mg total) by mouth 2 (two) times daily.  . Multiple Vitamin (MULTIVITAMIN) capsule Take 1 capsule by mouth daily.    . rosuvastatin (CRESTOR) 20 MG tablet Take 1 tablet (20 mg total) by mouth daily.     Allergies:   Patient has no known allergies.   Social History   Socioeconomic History  . Marital status: Married    Spouse name: [name]  . Number of children: 2  . Years of education: 10  . Highest education level: Not on file  Occupational History    Employer: RETIRED  Tobacco Use  . Smoking status: Former Smoker    Packs/day: 1.00    Years: 30.00    Pack years: 30.00    Types: Cigars, Cigarettes    Quit date: 07/11/1978    Years since quitting: 41.9  . Smokeless tobacco: Never Used  Vaping Use  . Vaping Use: Never used  Substance and Sexual Activity  . Alcohol use: Not Currently    Alcohol/week: 0.0 standard drinks  . Drug use: Yes    Types: Benzodiazepines  . Sexual activity: Not Currently  Other Topics Concern  . Not on file  Social History Narrative   Patient is married ([name]).   Patient has 3 children. 2 grandkids. 3 greatgrandkids.    Patient is retired Administrator.    Patient is  right-handed.   Patient has a 10th grade education.   Patient drinks one cup of coffee daily.      Hobbies: working in the yard- gardening competition with neighbor on who can grow best vegetables, "messing around the house"      07/21/14- wants wife to make decisions for him. Not sure if he has advanced directives.       Social Determinants of Health   Financial Resource Strain:   . Difficulty of Paying Living Expenses: Not on file  Food Insecurity:   . Worried About Charity fundraiser in the Last Year: Not on file  . Ran Out of Food in the Last Year: Not on file  Transportation Needs:   . Lack of Transportation (Medical): Not on file  . Lack of Transportation (Non-Medical): Not on  file  Physical Activity:   . Days of Exercise per Week: Not on file  . Minutes of Exercise per Session: Not on file  Stress:   . Feeling of Stress : Not on file  Social Connections:   . Frequency of Communication with Friends and Family: Not on file  . Frequency of Social Gatherings with Friends and Family: Not on file  . Attends Religious Services: Not on file  . Active Member of Clubs or Organizations: Not on file  . Attends Archivist Meetings: Not on file  . Marital Status: Not on file     Family History: The patient's family history includes Diabetes in his paternal grandmother.  ROS:   Please see the history of present illness.    All other systems reviewed and are negative.  EKGs/Labs/Other Studies Reviewed:    The following studies were reviewed today:  EKG:   05/22/20 Sinus 60; full criteria for anteroseptal infarct not met  Recent Labs: 01/15/2020: TSH 1.67 05/22/2020: ALT 20; BUN 18; Creat 1.49; Hemoglobin 12.3; Platelets 228; Potassium 4.8; Sodium 137  Recent Lipid Panel    Component Value Date/Time   CHOL 211 (H) 11/19/2019 1023   TRIG 172.0 (H) 11/19/2019 1023   TRIG 104 06/20/2006 0930   HDL 55.00 11/19/2019 1023   CHOLHDL 4 11/19/2019 1023   VLDL 34.4 11/19/2019 1023   LDLCALC 122 (H) 11/19/2019 1023   LDLDIRECT 98 05/22/2020 1039   CCTA: LAD and CX disease IMPRESSION: 1. Coronary calcium score of 3661. This was 96th percentile for age and sex matched control.  2. Normal coronary origin with right dominance.  3. Severe diffuse coronary calcifications limits interpretability of study and can lead to overestimation of stenosis.  4. Calcified plaque in the proximal to mid LAD causes severe (70-99%) stenosis. CTFFR across lesion in proximal LAD is 0.61 and falls to <0.5 in mid LAD, suggesting functional significance  5. Calcified plaque in the proximal to mid LCX causes severe (70-99%) stenosis. CTFFR <0.5 in mid LCX, suggesting  functional significance  6. Mixed plaque in the proximal to RCA causes moderate (50-69%) stenosis, CTFFR 0.82 suggesting lesion is not functionally significant. Diffuse calcified plaques throughout mid to distal RCA with CTFFR falling to 0.63 in distal RCA, suggesting functional significance  CAD-RADS 4 Severe stenosis. (70-99%). Cardiac catheterization is recommended. Consider symptom-guided anti-ischemic pharmacotherapy as well as risk factor modification per guideline directed care.  12/18 Echo- severe LHV without LA enlargement Study Conclusions   - Left ventricle: The cavity size was normal. Wall thickness was  increased in a pattern of severe LVH. Systolic function was  normal. The estimated ejection fraction was in  the range of 60%  to 65%. Wall motion was normal; there were no regional wall  motion abnormalities. Doppler parameters are consistent with  abnormal left ventricular relaxation (grade 1 diastolic  dysfunction).  - Tricuspid valve: There was mild-moderate regurgitation.   Physical Exam:    VS:  BP (!) 180/86   Pulse 60   Ht '5\' 9"'  (1.753 m)   Wt 161 lb 9.6 oz (73.3 kg)   SpO2 93%   BMI 23.86 kg/m     Wt Readings from Last 3 Encounters:  06/16/20 161 lb 9.6 oz (73.3 kg)  06/08/20 164 lb 6.4 oz (74.6 kg)  05/26/20 162 lb 6.4 oz (73.7 kg)    GEN: Well nourished, well developed in no acute distress HEENT: Normal NECK: No JVD; No carotid bruits LYMPHATICS: No lymphadenopathy CARDIAC: RRR,holosytolic 2/6 murmur, rubs, gallops Radial 2+ bilatrerally RESPIRATORY:  Clear to auscultation without rales, wheezing or rhonchi  ABDOMEN: Soft, non-tender, non-distended MUSCULOSKELETAL:  No edema; No deformity  SKIN: Warm and dry NEUROLOGIC:  Alert and oriented x 3 PSYCHIATRIC:  Normal affect   ASSESSMENT:    1. Essential hypertension    PLAN:    In order of problems listed above:  Coronary Artery Disease; Obstructive CKD IIIa Aortic  Atherosclerosis - symptomatic  - anatomy: with mLAD and LCx disease - continue ASA 81 mg PO Daily - continue statin - continue BB - will start PRN nitrates; on no PDEi - will puruse a LHC:  Discussed the risks and benefits and that he is likely not a CABG candidate, but despite diagnosis of MCD; he is very function and may be able to received PCI for symptoms burden - Risks and benefits of cardiac catheterization have been discussed with the patient.  These include bleeding, infection, kidney damage, stroke, heart attack, death.  The patient understands these risks and is willing to proceed.  Essential Hypertension - ambulatory blood pressure 140/70, will continue ambulatory BP monitoring; gave education on how to perform ambulatory blood pressure monitoring including the frequency and technique; goal ambulatory blood pressure < 135/85 on average - continue home medications with low threshold to add Imdur if wife calls in with elevated BP - discussed diet (DASH/low sodium), and exercise/weight loss interventions   Mild to Moderate TR Severe LVH, without LA enlargement Carpal Tunnel Surgery - Coreg 6.25 mG BID - euvolemic presently  Post Cath follow up unless new symptoms or abnormal test results warranting change in plan   Shared Decision Making/Informed Consent      Patient and Wife amenable to plan  The risks [stroke (1 in 1000), death (1 in 1000), kidney failure [usually temporary] (1 in 500), bleeding (1 in 200), allergic reaction [possibly serious] (1 in 200)], benefits (diagnostic support and management of coronary artery disease) and alternatives of a cardiac catheterization were discussed in detail with Mr. Baeten and he is willing to proceed.       Medication Adjustments/Labs and Tests Ordered: Current medicines are reviewed at length with the patient today.  Concerns regarding medicines are outlined above.  Orders Placed This Encounter  Procedures  . Basic metabolic  panel   Meds ordered this encounter  Medications  . aspirin EC 81 MG tablet    Sig: Take 1 tablet (81 mg total) by mouth daily. Swallow whole.    Dispense:  90 tablet    Refill:  3  . nitroGLYCERIN (NITROSTAT) 0.4 MG SL tablet    Sig: Place 1 tablet (0.4 mg total) under  the tongue every 5 (five) minutes as needed for chest pain.    Dispense:  25 tablet    Refill:  3    Patient Instructions  Medication Instructions:  1)  A prescription has been sent in for Nitroglycerin.  If you have chest pain that doesn't relieve quickly, place one tablet under your tongue and allow it to dissolve.  If no relief after 5 minutes, you may take another pill.  If no relief after 5 minutes, you may take a 3rd dose but you need to call 911 and report to ER immediately. 2) START Aspirin 87m once daily  *If you need a refill on your cardiac medications before your next appointment, please call your pharmacy*   Lab Work: BMET today  If you have labs (blood work) drawn today and your tests are completely normal, you will receive your results only by: .[name] KitchenMyChart Message (if you have MyChart) OR . A paper copy in the mail If you have any lab test that is abnormal or we need to change your treatment, we will call you to review the results.   Testing/Procedures: Your physician has requested that you have a cardiac catheterization. Cardiac catheterization is used to diagnose and/or treat various heart conditions. Doctors may recommend this procedure for a number of different reasons. The most common reason is to evaluate chest pain. Chest pain can be a symptom of coronary artery disease (CAD), and cardiac catheterization can show whether plaque is narrowing or blocking your heart's arteries. This procedure is also used to evaluate the valves, as well as measure the blood flow and oxygen levels in different parts of your heart. For further information please visit [email or web address] Please follow instruction  sheet, as given.    Follow-Up: [facility] and your [facility] needs are our priority.  As part of our continuing mission to provide you with exceptional heart care, we have created designated Provider Care Teams.  These Care Teams include your primary Cardiologist (physician) and Advanced Practice Providers (APPs -  Physician Assistants and Nurse Practitioners) who all work together to provide you with the care you need, when you need it.  We recommend signing up for the patient portal called "MyChart".  Sign up information is provided on this After Visit Summary.  MyChart is used to connect with patients for Virtual Visits (Telemedicine).  Patients are able to view lab/test results, encounter notes, upcoming appointments, etc.  Non-urgent messages can be sent to your provider as well.   To learn more about what you can do with MyChart, go to [email or web address]    Your next appointment:   2-3 week(s)  The format for your next appointment:   In Person  Provider:   You may see Dr. MRudean Haskellor one of the following Advanced Practice Providers on your designated Care Team:    DMelina Copa PA-C  MErmalinda Barrios PA-C    Other Instructions  Due to recent COVID-19 restrictions implemented by our local and state authorities and in an effort to keep both patients and staff as safe as possible, our hospital system requires COVID-19 testing prior to certain scheduled hospital procedures.  [place] [address] [place] [id number] Wednesday, December 8th at 10:50AM  .  This is a drive up testing site.  You will not need to exit your vehicle. You must agree to self-quarantine from the time of your testing until the procedure date on 06/28/2020.  This should included staying  home with ONLY the people you live with.  Avoid take-out, grocery store shopping or leaving the house for any non-emergent reason.  Failure to have your COVID-19 test done on the date and time  you have been scheduled will result in cancellation of your procedure.  Please call our office at [phone] if you have any questions.     [place] OFFICE [place], [place] [address] Dept: [phone] Loc: [phone]  Dallyn Bergland  06/16/2020  You are scheduled for a Cardiac Catheterization on Friday, December 10 with Dr. Shelva Majestic.  1. Please arrive at the [facility] (Main Entrance A) at [facility]: [address] [place], [place] [address] at 10:00 AM (This time is two hours before your procedure to ensure your preparation). Free valet parking service is available.   Special note: Every effort is made to have your procedure done on time. Please understand that emergencies sometimes delay scheduled procedures.  2. Diet: Do not eat solid foods after midnight.  The patient may have clear liquids until 5am upon the day of the procedure.  3. Labs: You will have labs drawn today.  4. Medication instructions in preparation for your procedure:   Contrast Allergy: No  You will need to hold your Chlorthalidone the morning of your procedure.  On the morning of your procedure, take your Aspirin and any morning medicines NOT listed above.  You may use sips of water.  5. Plan for one night stay--bring personal belongings. 6. Bring a current list of your medications and current insurance cards. 7. You MUST have a responsible person to drive you home. 8. Someone MUST be with you the first 24 hours after you arrive home or your discharge will be delayed. 9. Please wear clothes that are easy to get on and off and wear slip-on shoes.  Thank you for allowing [place] to care for you!   -- [facility] Invasive Cardiovascular services      Signed, Werner Lean, MD  06/16/2020 10:40 AM    [place]

## 2020-06-16 NOTE — Addendum Note (Signed)
Addended by: Rudean Haskell A on: 06/16/2020 10:44 AM   Modules accepted: Orders, SmartSet

## 2020-06-16 NOTE — H&P (View-Only) (Signed)
Cardiology Office Note:    Date:  06/16/2020   ID:  Isaiah Davidson, DOB [birth year], MRN [id number]  PCP:  Marin Olp, MD  [facility] Cardiologist:  No primary care provider on file.  [facility] Electrophysiologist:  None   CC:  Follow up CT Scan  History of Present Illness:    Isaiah Davidson is a 86 y.o. male with a hx of HTN, HLD, Aortic Atherosclerosis, Carpal Tunnel Syndrome, Mild Cognitive Impairment on Donepezil, who presents for 05/26/20 for Anginal equivalenet.  Patient notes that he is doing the same.  Since last visit notes stable exertional chest pain and shortness of breath changes.  Relevant interval testing includes CCTA showing multiple blockages including LAD and LCx disease.  There are no interval hospital/ED visit.    No change in his chest pain.  No resting SOB but notes DOE and no PND/Orthopnea.  No weight gain or leg swelling.  No palpitations or syncope.  Ambulatory BP  140/70  Past Medical History:  Diagnosis Date  . Heart murmur   . HYPERLIPIDEMIA 02/22/2007  . HYPERTENSION 02/22/2007  . MCI (mild cognitive impairment) with memory loss 05/14/2014  . Seizures ([place])    treated for siezures around 2010    Past Surgical History:  Procedure Laterality Date  . CARPAL TUNNEL RELEASE Bilateral   . CHOLECYSTECTOMY N/A 06/15/2018   Procedure: LAPAROSCOPIC CHOLECYSTECTOMY;  Surgeon: Mickeal Skinner, MD;  Location: [facility];  Service: General;  Laterality: N/A;  . KNEE ARTHROSCOPY Right     Current Medications: Current Meds  Medication Sig  . allopurinol (ZYLOPRIM) 100 MG tablet Take 1 tablet (100 mg total) by mouth daily.  [name] Kitchen amLODipine (NORVASC) 10 MG tablet Take 1 tablet (10 mg total) by mouth daily.  . carvedilol (COREG) 6.25 MG tablet Take 1 tablet (6.25 mg total) by mouth 2 (two) times daily.  . chlorthalidone (HYGROTON) 25 MG tablet Take 1 tablet (25 mg total) by mouth daily.  [name] Kitchen donepezil (ARICEPT) 10 MG tablet Take 1 tablet (10  mg total) by mouth at bedtime.  . levETIRAcetam (KEPPRA) 500 MG tablet Take 1 tablet (500 mg total) by mouth 2 (two) times daily.  . Multiple Vitamin (MULTIVITAMIN) capsule Take 1 capsule by mouth daily.    . rosuvastatin (CRESTOR) 20 MG tablet Take 1 tablet (20 mg total) by mouth daily.     Allergies:   Patient has no known allergies.   Social History   Socioeconomic History  . Marital status: Married    Spouse name: [name]  . Number of children: 2  . Years of education: 10  . Highest education level: Not on file  Occupational History    Employer: RETIRED  Tobacco Use  . Smoking status: Former Smoker    Packs/day: 1.00    Years: 30.00    Pack years: 30.00    Types: Cigars, Cigarettes    Quit date: 07/11/1978    Years since quitting: 41.9  . Smokeless tobacco: Never Used  Vaping Use  . Vaping Use: Never used  Substance and Sexual Activity  . Alcohol use: Not Currently    Alcohol/week: 0.0 standard drinks  . Drug use: Yes    Types: Benzodiazepines  . Sexual activity: Not Currently  Other Topics Concern  . Not on file  Social History Narrative   Patient is married ([name]).   Patient has 3 children. 2 grandkids. 3 greatgrandkids.    Patient is retired Administrator.    Patient is  right-handed.   Patient has a 10th grade education.   Patient drinks one cup of coffee daily.      Hobbies: working in the yard- gardening competition with neighbor on who can grow best vegetables, "messing around the house"      07/21/14- wants wife to make decisions for him. Not sure if he has advanced directives.       Social Determinants of Health   Financial Resource Strain:   . Difficulty of Paying Living Expenses: Not on file  Food Insecurity:   . Worried About Charity fundraiser in the Last Year: Not on file  . Ran Out of Food in the Last Year: Not on file  Transportation Needs:   . Lack of Transportation (Medical): Not on file  . Lack of Transportation (Non-Medical): Not on  file  Physical Activity:   . Days of Exercise per Week: Not on file  . Minutes of Exercise per Session: Not on file  Stress:   . Feeling of Stress : Not on file  Social Connections:   . Frequency of Communication with Friends and Family: Not on file  . Frequency of Social Gatherings with Friends and Family: Not on file  . Attends Religious Services: Not on file  . Active Member of Clubs or Organizations: Not on file  . Attends Archivist Meetings: Not on file  . Marital Status: Not on file     Family History: The patient's family history includes Diabetes in his paternal grandmother.  ROS:   Please see the history of present illness.    All other systems reviewed and are negative.  EKGs/Labs/Other Studies Reviewed:    The following studies were reviewed today:  EKG:   05/22/20 Sinus 60; full criteria for anteroseptal infarct not met  Recent Labs: 01/15/2020: TSH 1.67 05/22/2020: ALT 20; BUN 18; Creat 1.49; Hemoglobin 12.3; Platelets 228; Potassium 4.8; Sodium 137  Recent Lipid Panel    Component Value Date/Time   CHOL 211 (H) 11/19/2019 1023   TRIG 172.0 (H) 11/19/2019 1023   TRIG 104 06/20/2006 0930   HDL 55.00 11/19/2019 1023   CHOLHDL 4 11/19/2019 1023   VLDL 34.4 11/19/2019 1023   LDLCALC 122 (H) 11/19/2019 1023   LDLDIRECT 98 05/22/2020 1039   CCTA: LAD and CX disease IMPRESSION: 1. Coronary calcium score of 3661. This was 96th percentile for age and sex matched control.  2. Normal coronary origin with right dominance.  3. Severe diffuse coronary calcifications limits interpretability of study and can lead to overestimation of stenosis.  4. Calcified plaque in the proximal to mid LAD causes severe (70-99%) stenosis. CTFFR across lesion in proximal LAD is 0.61 and falls to <0.5 in mid LAD, suggesting functional significance  5. Calcified plaque in the proximal to mid LCX causes severe (70-99%) stenosis. CTFFR <0.5 in mid LCX, suggesting  functional significance  6. Mixed plaque in the proximal to RCA causes moderate (50-69%) stenosis, CTFFR 0.82 suggesting lesion is not functionally significant. Diffuse calcified plaques throughout mid to distal RCA with CTFFR falling to 0.63 in distal RCA, suggesting functional significance  CAD-RADS 4 Severe stenosis. (70-99%). Cardiac catheterization is recommended. Consider symptom-guided anti-ischemic pharmacotherapy as well as risk factor modification per guideline directed care.  12/18 Echo- severe LHV without LA enlargement Study Conclusions   - Left ventricle: The cavity size was normal. Wall thickness was  increased in a pattern of severe LVH. Systolic function was  normal. The estimated ejection fraction was in  the range of 60%  to 65%. Wall motion was normal; there were no regional wall  motion abnormalities. Doppler parameters are consistent with  abnormal left ventricular relaxation (grade 1 diastolic  dysfunction).  - Tricuspid valve: There was mild-moderate regurgitation.   Physical Exam:    VS:  BP (!) 180/86   Pulse 60   Ht '5\' 9"'  (1.753 m)   Wt 161 lb 9.6 oz (73.3 kg)   SpO2 93%   BMI 23.86 kg/m     Wt Readings from Last 3 Encounters:  06/16/20 161 lb 9.6 oz (73.3 kg)  06/08/20 164 lb 6.4 oz (74.6 kg)  05/26/20 162 lb 6.4 oz (73.7 kg)    GEN: Well nourished, well developed in no acute distress HEENT: Normal NECK: No JVD; No carotid bruits LYMPHATICS: No lymphadenopathy CARDIAC: RRR,holosytolic 2/6 murmur, rubs, gallops Radial 2+ bilatrerally RESPIRATORY:  Clear to auscultation without rales, wheezing or rhonchi  ABDOMEN: Soft, non-tender, non-distended MUSCULOSKELETAL:  No edema; No deformity  SKIN: Warm and dry NEUROLOGIC:  Alert and oriented x 3 PSYCHIATRIC:  Normal affect   ASSESSMENT:    1. Essential hypertension    PLAN:    In order of problems listed above:  Coronary Artery Disease; Obstructive CKD IIIa Aortic  Atherosclerosis - symptomatic  - anatomy: with mLAD and LCx disease - continue ASA 81 mg PO Daily - continue statin - continue BB - will start PRN nitrates; on no PDEi - will puruse a LHC:  Discussed the risks and benefits and that he is likely not a CABG candidate, but despite diagnosis of MCD; he is very function and may be able to received PCI for symptoms burden - Risks and benefits of cardiac catheterization have been discussed with the patient.  These include bleeding, infection, kidney damage, stroke, heart attack, death.  The patient understands these risks and is willing to proceed.  Essential Hypertension - ambulatory blood pressure 140/70, will continue ambulatory BP monitoring; gave education on how to perform ambulatory blood pressure monitoring including the frequency and technique; goal ambulatory blood pressure < 135/85 on average - continue home medications with low threshold to add Imdur if wife calls in with elevated BP - discussed diet (DASH/low sodium), and exercise/weight loss interventions   Mild to Moderate TR Severe LVH, without LA enlargement Carpal Tunnel Surgery - Coreg 6.25 mG BID - euvolemic presently  Post Cath follow up unless new symptoms or abnormal test results warranting change in plan   Shared Decision Making/Informed Consent      Patient and Wife amenable to plan  The risks [stroke (1 in 1000), death (1 in 1000), kidney failure [usually temporary] (1 in 500), bleeding (1 in 200), allergic reaction [possibly serious] (1 in 200)], benefits (diagnostic support and management of coronary artery disease) and alternatives of a cardiac catheterization were discussed in detail with Isaiah Davidson and he is willing to proceed.       Medication Adjustments/Labs and Tests Ordered: Current medicines are reviewed at length with the patient today.  Concerns regarding medicines are outlined above.  Orders Placed This Encounter  Procedures  . Basic metabolic  panel   Meds ordered this encounter  Medications  . aspirin EC 81 MG tablet    Sig: Take 1 tablet (81 mg total) by mouth daily. Swallow whole.    Dispense:  90 tablet    Refill:  3  . nitroGLYCERIN (NITROSTAT) 0.4 MG SL tablet    Sig: Place 1 tablet (0.4 mg total) under  the tongue every 5 (five) minutes as needed for chest pain.    Dispense:  25 tablet    Refill:  3    Patient Instructions  Medication Instructions:  1)  A prescription has been sent in for Nitroglycerin.  If you have chest pain that doesn't relieve quickly, place one tablet under your tongue and allow it to dissolve.  If no relief after 5 minutes, you may take another pill.  If no relief after 5 minutes, you may take a 3rd dose but you need to call 911 and report to ER immediately. 2) START Aspirin 90m once daily  *If you need a refill on your cardiac medications before your next appointment, please call your pharmacy*   Lab Work: BMET today  If you have labs (blood work) drawn today and your tests are completely normal, you will receive your results only by: .[name] KitchenMyChart Message (if you have MyChart) OR . A paper copy in the mail If you have any lab test that is abnormal or we need to change your treatment, we will call you to review the results.   Testing/Procedures: Your physician has requested that you have a cardiac catheterization. Cardiac catheterization is used to diagnose and/or treat various heart conditions. Doctors may recommend this procedure for a number of different reasons. The most common reason is to evaluate chest pain. Chest pain can be a symptom of coronary artery disease (CAD), and cardiac catheterization can show whether plaque is narrowing or blocking your heart's arteries. This procedure is also used to evaluate the valves, as well as measure the blood flow and oxygen levels in different parts of your heart. For further information please visit [email or web address] Please follow instruction  sheet, as given.    Follow-Up: At [facility] you and your health needs are our priority.  As part of our continuing mission to provide you with exceptional heart care, we have created designated Provider Care Teams.  These Care Teams include your primary Cardiologist (physician) and Advanced Practice Providers (APPs -  Physician Assistants and Nurse Practitioners) who all work together to provide you with the care you need, when you need it.  We recommend signing up for the patient portal called "MyChart".  Sign up information is provided on this After Visit Summary.  MyChart is used to connect with patients for Virtual Visits (Telemedicine).  Patients are able to view lab/test results, encounter notes, upcoming appointments, etc.  Non-urgent messages can be sent to your provider as well.   To learn more about what you can do with MyChart, go to [email or web address]    Your next appointment:   2-3 week(s)  The format for your next appointment:   In Person  Provider:   You may see Dr. MRudean Haskellor one of the following Advanced Practice Providers on your designated Care Team:    DMelina Copa PA-C  MErmalinda Barrios PA-C    Other Instructions  Due to recent COVID-19 restrictions implemented by our local and state authorities and in an effort to keep both patients and staff as safe as possible, our hospital system requires COVID-19 testing prior to certain scheduled hospital procedures.  Please go to [address] [place] [id number] Wednesday, December 8th at 10:50AM  .  This is a drive up testing site.  You will not need to exit your vehicle. You must agree to self-quarantine from the time of your testing until the procedure date on 06/18/2020.  This should included staying  home with ONLY the people you live with.  Avoid take-out, grocery store shopping or leaving the house for any non-emergent reason.  Failure to have your COVID-19 test done on the date and time  you have been scheduled will result in cancellation of your procedure.  Please call our office at [phone] if you have any questions.     [place] OFFICE [place] [place] [address] Dept: [phone] Loc: [phone]  Isaiah Davidson  06/16/2020  You are scheduled for a Cardiac Catheterization on Friday, December 10 with Dr. Shelva Majestic.  1. Please arrive at the [facility] (Main Entrance A) at [facility]: [address] [place], [place] [address] at 10:00 AM (This time is two hours before your procedure to ensure your preparation). Free valet parking service is available.   Special note: Every effort is made to have your procedure done on time. Please understand that emergencies sometimes delay scheduled procedures.  2. Diet: Do not eat solid foods after midnight.  The patient may have clear liquids until 5am upon the day of the procedure.  3. Labs: You will have labs drawn today.  4. Medication instructions in preparation for your procedure:   Contrast Allergy: No  You will need to hold your Chlorthalidone the morning of your procedure.  On the morning of your procedure, take your Aspirin and any morning medicines NOT listed above.  You may use sips of water.  5. Plan for one night stay--bring personal belongings. 6. Bring a current list of your medications and current insurance cards. 7. You MUST have a responsible person to drive you home. 8. Someone MUST be with you the first 24 hours after you arrive home or your discharge will be delayed. 9. Please wear clothes that are easy to get on and off and wear slip-on shoes.  Thank you for allowing [place] to care for you!   -- [facility] Invasive Cardiovascular services      Signed, Werner Lean, MD  06/16/2020 10:40 AM    [place]

## 2020-06-17 ENCOUNTER — Telehealth: Payer: Self-pay | Admitting: *Deleted

## 2020-06-17 ENCOUNTER — Other Ambulatory Visit (HOSPITAL_COMMUNITY)
Admission: RE | Admit: 2020-06-17 | Discharge: 2020-06-17 | Disposition: A | Discharge status: Home / Self Care | Payer: Medicare HMO | Source: Ambulatory Visit | Attending: Cardiovascular Disease | Admitting: Cardiovascular Disease

## 2020-06-17 DIAGNOSIS — I2510 Atherosclerotic heart disease of native coronary artery without angina pectoris: Secondary | ICD-10-CM | POA: Diagnosis not present

## 2020-06-17 DIAGNOSIS — Z66 Do not resuscitate: Secondary | ICD-10-CM | POA: Diagnosis not present

## 2020-06-17 DIAGNOSIS — J9601 Acute respiratory failure with hypoxia: Secondary | ICD-10-CM | POA: Diagnosis not present

## 2020-06-17 DIAGNOSIS — I130 Hypertensive heart and chronic kidney disease with heart failure and stage 1 through stage 4 chronic kidney disease, or unspecified chronic kidney disease: Secondary | ICD-10-CM | POA: Diagnosis present

## 2020-06-17 DIAGNOSIS — E785 Hyperlipidemia, unspecified: Secondary | ICD-10-CM | POA: Diagnosis present

## 2020-06-17 DIAGNOSIS — R931 Abnormal findings on diagnostic imaging of heart and coronary circulation: Secondary | ICD-10-CM | POA: Diagnosis not present

## 2020-06-17 DIAGNOSIS — Z79899 Other long term (current) drug therapy: Secondary | ICD-10-CM | POA: Diagnosis not present

## 2020-06-17 DIAGNOSIS — I5082 Biventricular heart failure: Secondary | ICD-10-CM | POA: Diagnosis present

## 2020-06-17 DIAGNOSIS — J90 Pleural effusion, not elsewhere classified: Secondary | ICD-10-CM | POA: Diagnosis not present

## 2020-06-17 DIAGNOSIS — I959 Hypotension, unspecified: Secondary | ICD-10-CM | POA: Diagnosis not present

## 2020-06-17 DIAGNOSIS — I509 Heart failure, unspecified: Secondary | ICD-10-CM | POA: Diagnosis not present

## 2020-06-17 DIAGNOSIS — R000 Tachycardia, unspecified: Secondary | ICD-10-CM | POA: Diagnosis not present

## 2020-06-17 DIAGNOSIS — E782 Mixed hyperlipidemia: Secondary | ICD-10-CM | POA: Diagnosis not present

## 2020-06-17 DIAGNOSIS — Z7982 Long term (current) use of aspirin: Secondary | ICD-10-CM | POA: Diagnosis not present

## 2020-06-17 DIAGNOSIS — G3184 Mild cognitive impairment, so stated: Secondary | ICD-10-CM | POA: Diagnosis present

## 2020-06-17 DIAGNOSIS — I43 Cardiomyopathy in diseases classified elsewhere: Secondary | ICD-10-CM | POA: Diagnosis present

## 2020-06-17 DIAGNOSIS — Z515 Encounter for palliative care: Secondary | ICD-10-CM | POA: Diagnosis not present

## 2020-06-17 DIAGNOSIS — Z20822 Contact with and (suspected) exposure to covid-19: Secondary | ICD-10-CM | POA: Diagnosis present

## 2020-06-17 DIAGNOSIS — R570 Cardiogenic shock: Secondary | ICD-10-CM | POA: Diagnosis not present

## 2020-06-17 DIAGNOSIS — I5021 Acute systolic (congestive) heart failure: Secondary | ICD-10-CM | POA: Diagnosis not present

## 2020-06-17 DIAGNOSIS — N1831 Chronic kidney disease, stage 3a: Secondary | ICD-10-CM | POA: Diagnosis present

## 2020-06-17 DIAGNOSIS — I255 Ischemic cardiomyopathy: Secondary | ICD-10-CM | POA: Diagnosis not present

## 2020-06-17 DIAGNOSIS — I25110 Atherosclerotic heart disease of native coronary artery with unstable angina pectoris: Secondary | ICD-10-CM | POA: Diagnosis not present

## 2020-06-17 DIAGNOSIS — I110 Hypertensive heart disease with heart failure: Secondary | ICD-10-CM | POA: Diagnosis not present

## 2020-06-17 DIAGNOSIS — E43 Unspecified severe protein-calorie malnutrition: Secondary | ICD-10-CM | POA: Diagnosis not present

## 2020-06-17 DIAGNOSIS — R61 Generalized hyperhidrosis: Secondary | ICD-10-CM | POA: Diagnosis not present

## 2020-06-17 DIAGNOSIS — R739 Hyperglycemia, unspecified: Secondary | ICD-10-CM | POA: Diagnosis not present

## 2020-06-17 DIAGNOSIS — I25118 Atherosclerotic heart disease of native coronary artery with other forms of angina pectoris: Secondary | ICD-10-CM | POA: Diagnosis not present

## 2020-06-17 DIAGNOSIS — Z01810 Encounter for preprocedural cardiovascular examination: Secondary | ICD-10-CM | POA: Diagnosis not present

## 2020-06-17 DIAGNOSIS — I513 Intracardiac thrombosis, not elsewhere classified: Secondary | ICD-10-CM | POA: Diagnosis present

## 2020-06-17 DIAGNOSIS — J449 Chronic obstructive pulmonary disease, unspecified: Secondary | ICD-10-CM | POA: Diagnosis present

## 2020-06-17 DIAGNOSIS — G40909 Epilepsy, unspecified, not intractable, without status epilepticus: Secondary | ICD-10-CM | POA: Diagnosis present

## 2020-06-17 DIAGNOSIS — R0602 Shortness of breath: Secondary | ICD-10-CM | POA: Diagnosis not present

## 2020-06-17 DIAGNOSIS — I214 Non-ST elevation (NSTEMI) myocardial infarction: Secondary | ICD-10-CM | POA: Diagnosis not present

## 2020-06-17 DIAGNOSIS — I350 Nonrheumatic aortic (valve) stenosis: Secondary | ICD-10-CM | POA: Diagnosis not present

## 2020-06-17 DIAGNOSIS — N183 Chronic kidney disease, stage 3 unspecified: Secondary | ICD-10-CM | POA: Diagnosis not present

## 2020-06-17 DIAGNOSIS — Z87891 Personal history of nicotine dependence: Secondary | ICD-10-CM | POA: Diagnosis not present

## 2020-06-17 DIAGNOSIS — I4891 Unspecified atrial fibrillation: Secondary | ICD-10-CM | POA: Diagnosis present

## 2020-06-17 DIAGNOSIS — I361 Nonrheumatic tricuspid (valve) insufficiency: Secondary | ICD-10-CM | POA: Diagnosis not present

## 2020-06-17 DIAGNOSIS — Z4682 Encounter for fitting and adjustment of non-vascular catheter: Secondary | ICD-10-CM | POA: Diagnosis not present

## 2020-06-17 DIAGNOSIS — I472 Ventricular tachycardia: Secondary | ICD-10-CM | POA: Diagnosis not present

## 2020-06-17 DIAGNOSIS — N179 Acute kidney failure, unspecified: Secondary | ICD-10-CM | POA: Diagnosis present

## 2020-06-17 DIAGNOSIS — Z01812 Encounter for preprocedural laboratory examination: Secondary | ICD-10-CM | POA: Insufficient documentation

## 2020-06-17 LAB — SARS CORONAVIRUS 2 (TAT 6-24 HRS): SARS Coronavirus 2: NEGATIVE

## 2020-06-17 NOTE — Telephone Encounter (Signed)
Pt contacted pre-catheterization scheduled at [facility] for: Friday June 19, 2020 12 Noon Verified arrival time and place: [facility]) at: 10 AM   No solid food after midnight prior to cath, clear liquids until 5 AM day of procedure.  Hold: Chlorthalidone-day before and day of procedure-GFR 51  Except hold medications AM meds can be  taken pre-cath with sips of water including: ASA 81 mg   Confirmed patient has responsible adult to drive home post procedure and be with patient first 24 hours after arriving home: yes  You are allowed ONE visitor in the waiting room during the time you are at the hospital for your procedure. Both you and your visitor must wear a mask once you enter the hospital.       COVID-19 Pre-Screening Questions:  . In the past 14 days have you had any symptoms concerning for COVID-19 infection (fever, chills, cough, or new shortness of breath)? no . In the past 14 days have you been around anyone with known Covid 19? no    Reviewed procedure/mask/visitor instructions, COVID-19 questions with patient.

## 2020-06-19 ENCOUNTER — Inpatient Hospital Stay (HOSPITAL_COMMUNITY)
Admission: AD | Admit: 2020-06-19 | Discharge: 2020-07-11 | DRG: 286 | Disposition: E | Payer: Medicare HMO | Attending: Pulmonary Disease | Admitting: Pulmonary Disease

## 2020-06-19 ENCOUNTER — Other Ambulatory Visit: Payer: Self-pay

## 2020-06-19 ENCOUNTER — Encounter (HOSPITAL_COMMUNITY): Admission: AD | Disposition: E | Payer: Self-pay | Source: Home / Self Care | Attending: Internal Medicine

## 2020-06-19 DIAGNOSIS — Z0189 Encounter for other specified special examinations: Secondary | ICD-10-CM

## 2020-06-19 DIAGNOSIS — J9601 Acute respiratory failure with hypoxia: Secondary | ICD-10-CM | POA: Diagnosis not present

## 2020-06-19 DIAGNOSIS — N183 Chronic kidney disease, stage 3 unspecified: Secondary | ICD-10-CM | POA: Diagnosis not present

## 2020-06-19 DIAGNOSIS — I43 Cardiomyopathy in diseases classified elsewhere: Secondary | ICD-10-CM | POA: Diagnosis present

## 2020-06-19 DIAGNOSIS — Z452 Encounter for adjustment and management of vascular access device: Secondary | ICD-10-CM

## 2020-06-19 DIAGNOSIS — Z515 Encounter for palliative care: Secondary | ICD-10-CM

## 2020-06-19 DIAGNOSIS — I25118 Atherosclerotic heart disease of native coronary artery with other forms of angina pectoris: Secondary | ICD-10-CM | POA: Diagnosis not present

## 2020-06-19 DIAGNOSIS — I255 Ischemic cardiomyopathy: Secondary | ICD-10-CM | POA: Diagnosis present

## 2020-06-19 DIAGNOSIS — G3184 Mild cognitive impairment, so stated: Secondary | ICD-10-CM | POA: Diagnosis present

## 2020-06-19 DIAGNOSIS — I130 Hypertensive heart and chronic kidney disease with heart failure and stage 1 through stage 4 chronic kidney disease, or unspecified chronic kidney disease: Secondary | ICD-10-CM | POA: Diagnosis not present

## 2020-06-19 DIAGNOSIS — I25110 Atherosclerotic heart disease of native coronary artery with unstable angina pectoris: Secondary | ICD-10-CM | POA: Diagnosis not present

## 2020-06-19 DIAGNOSIS — I5082 Biventricular heart failure: Secondary | ICD-10-CM | POA: Diagnosis present

## 2020-06-19 DIAGNOSIS — N1831 Chronic kidney disease, stage 3a: Secondary | ICD-10-CM | POA: Diagnosis present

## 2020-06-19 DIAGNOSIS — I509 Heart failure, unspecified: Secondary | ICD-10-CM

## 2020-06-19 DIAGNOSIS — G40909 Epilepsy, unspecified, not intractable, without status epilepticus: Secondary | ICD-10-CM | POA: Diagnosis present

## 2020-06-19 DIAGNOSIS — I2510 Atherosclerotic heart disease of native coronary artery without angina pectoris: Secondary | ICD-10-CM | POA: Diagnosis not present

## 2020-06-19 DIAGNOSIS — I5021 Acute systolic (congestive) heart failure: Secondary | ICD-10-CM | POA: Diagnosis not present

## 2020-06-19 DIAGNOSIS — Z87891 Personal history of nicotine dependence: Secondary | ICD-10-CM | POA: Diagnosis not present

## 2020-06-19 DIAGNOSIS — Z79899 Other long term (current) drug therapy: Secondary | ICD-10-CM

## 2020-06-19 DIAGNOSIS — J449 Chronic obstructive pulmonary disease, unspecified: Secondary | ICD-10-CM | POA: Diagnosis present

## 2020-06-19 DIAGNOSIS — I513 Intracardiac thrombosis, not elsewhere classified: Secondary | ICD-10-CM | POA: Diagnosis present

## 2020-06-19 DIAGNOSIS — N179 Acute kidney failure, unspecified: Secondary | ICD-10-CM | POA: Diagnosis present

## 2020-06-19 DIAGNOSIS — Z7982 Long term (current) use of aspirin: Secondary | ICD-10-CM | POA: Diagnosis not present

## 2020-06-19 DIAGNOSIS — I4891 Unspecified atrial fibrillation: Secondary | ICD-10-CM | POA: Diagnosis present

## 2020-06-19 DIAGNOSIS — R570 Cardiogenic shock: Secondary | ICD-10-CM | POA: Diagnosis present

## 2020-06-19 DIAGNOSIS — E782 Mixed hyperlipidemia: Secondary | ICD-10-CM | POA: Diagnosis not present

## 2020-06-19 DIAGNOSIS — Z20822 Contact with and (suspected) exposure to covid-19: Secondary | ICD-10-CM | POA: Diagnosis present

## 2020-06-19 DIAGNOSIS — R931 Abnormal findings on diagnostic imaging of heart and coronary circulation: Secondary | ICD-10-CM

## 2020-06-19 DIAGNOSIS — Z01810 Encounter for preprocedural cardiovascular examination: Secondary | ICD-10-CM | POA: Diagnosis not present

## 2020-06-19 DIAGNOSIS — E43 Unspecified severe protein-calorie malnutrition: Secondary | ICD-10-CM | POA: Diagnosis not present

## 2020-06-19 DIAGNOSIS — Z66 Do not resuscitate: Secondary | ICD-10-CM | POA: Diagnosis not present

## 2020-06-19 DIAGNOSIS — I472 Ventricular tachycardia: Secondary | ICD-10-CM | POA: Diagnosis not present

## 2020-06-19 DIAGNOSIS — R319 Hematuria, unspecified: Secondary | ICD-10-CM | POA: Diagnosis not present

## 2020-06-19 DIAGNOSIS — I350 Nonrheumatic aortic (valve) stenosis: Secondary | ICD-10-CM | POA: Diagnosis not present

## 2020-06-19 DIAGNOSIS — E785 Hyperlipidemia, unspecified: Secondary | ICD-10-CM | POA: Diagnosis present

## 2020-06-19 DIAGNOSIS — I361 Nonrheumatic tricuspid (valve) insufficiency: Secondary | ICD-10-CM | POA: Diagnosis not present

## 2020-06-19 DIAGNOSIS — R0689 Other abnormalities of breathing: Secondary | ICD-10-CM

## 2020-06-19 DIAGNOSIS — R739 Hyperglycemia, unspecified: Secondary | ICD-10-CM | POA: Diagnosis not present

## 2020-06-19 DIAGNOSIS — I214 Non-ST elevation (NSTEMI) myocardial infarction: Secondary | ICD-10-CM | POA: Diagnosis not present

## 2020-06-19 HISTORY — PX: CORONARY ANGIOGRAPHY: CATH118303

## 2020-06-19 LAB — BASIC METABOLIC PANEL
Anion gap: 13 (ref 5–15)
BUN: 15 mg/dL (ref 8–23)
CO2: 20 mmol/L — ABNORMAL LOW (ref 22–32)
Calcium: 8.5 mg/dL — ABNORMAL LOW (ref 8.9–10.3)
Chloride: 103 mmol/L (ref 98–111)
Creatinine, Ser: 1.62 mg/dL — ABNORMAL HIGH (ref 0.61–1.24)
GFR, Estimated: 41 mL/min — ABNORMAL LOW (ref >60)
Glucose, Bld: 118 mg/dL — ABNORMAL HIGH (ref 70–99)
Potassium: 3.6 mmol/L (ref 3.5–5.1)
Sodium: 136 mmol/L (ref 135–145)

## 2020-06-19 LAB — CBC
HCT: 33.7 % — ABNORMAL LOW (ref 39.0–52.0)
HCT: 31.4 % — ABNORMAL LOW (ref 39.0–52.0)
Hemoglobin: 12.1 g/dL — ABNORMAL LOW (ref 13.0–17.0)
Hemoglobin: 11.1 g/dL — ABNORMAL LOW (ref 13.0–17.0)
MCH: 31.7 pg (ref 26.0–34.0)
MCH: 31.7 pg (ref 26.0–34.0)
MCHC: 35.9 g/dL (ref 30.0–36.0)
MCHC: 35.4 g/dL (ref 30.0–36.0)
MCV: 88.2 fL (ref 80.0–100.0)
MCV: 89.7 fL (ref 80.0–100.0)
Platelets: 187 10*3/uL (ref 150–400)
Platelets: 184 10*3/uL (ref 150–400)
RBC: 3.82 MIL/uL — ABNORMAL LOW (ref 4.22–5.81)
RBC: 3.5 MIL/uL — ABNORMAL LOW (ref 4.22–5.81)
RDW: 12.8 % (ref 11.5–15.5)
RDW: 12.9 % (ref 11.5–15.5)
WBC: 4.5 10*3/uL (ref 4.0–10.5)
WBC: 6.2 10*3/uL (ref 4.0–10.5)
nRBC: 0 % (ref 0.0–0.2)
nRBC: 0 % (ref 0.0–0.2)

## 2020-06-19 LAB — CREATININE, SERUM
Creatinine, Ser: 1.22 mg/dL (ref 0.61–1.24)
GFR, Estimated: 58 mL/min — ABNORMAL LOW (ref >60)

## 2020-06-19 LAB — LACTIC ACID, PLASMA: Lactic Acid, Venous: 3.3 mmol/L (ref 0.5–1.9)

## 2020-06-19 SURGERY — CORONARY ANGIOGRAPHY (CATH LAB)
Anesthesia: LOCAL

## 2020-06-19 MED ORDER — ISOSORBIDE MONONITRATE ER 30 MG PO TB24: 30.0000 mg | ORAL_TABLET | Freq: Every day | ORAL | Status: DC

## 2020-06-19 MED ORDER — HYDRALAZINE HCL 20 MG/ML IJ SOLN
INTRAMUSCULAR | Status: AC
  Filled 2020-06-19: qty 1

## 2020-06-19 MED ORDER — LIDOCAINE HCL (PF) 1 % IJ SOLN
INTRAMUSCULAR | Status: DC | PRN
  Administered 2020-06-19: 15 mL
  Administered 2020-06-19: 2 mL

## 2020-06-19 MED ORDER — ONDANSETRON HCL 4 MG/2ML IJ SOLN
4.0000 mg | Freq: Four times a day (QID) | INTRAMUSCULAR | Status: DC | PRN
  Administered 2020-06-19 – 2020-06-24 (×4): 4 mg via INTRAVENOUS
  Filled 2020-06-19 (×3): qty 2

## 2020-06-19 MED ORDER — VERAPAMIL HCL 2.5 MG/ML IV SOLN
INTRAVENOUS | Status: AC
  Filled 2020-06-19: qty 2

## 2020-06-19 MED ORDER — ROSUVASTATIN CALCIUM 20 MG PO TABS
20.0000 mg | ORAL_TABLET | Freq: Every day | ORAL | Status: DC
Start: 2020-06-19 — End: 2020-06-19

## 2020-06-19 MED ORDER — SODIUM CHLORIDE 0.9 % WEIGHT BASED INFUSION
3.0000 mL/kg/h | INTRAVENOUS | Status: DC
  Administered 2020-06-19: 3 mL/kg/h via INTRAVENOUS

## 2020-06-19 MED ORDER — FENTANYL CITRATE (PF) 100 MCG/2ML IJ SOLN
INTRAMUSCULAR | Status: DC | PRN
  Administered 2020-06-19: 25 ug via INTRAVENOUS

## 2020-06-19 MED ORDER — MIDAZOLAM HCL 2 MG/2ML IJ SOLN
INTRAMUSCULAR | Status: DC | PRN
  Administered 2020-06-19: 2 mg via INTRAVENOUS

## 2020-06-19 MED ORDER — LABETALOL HCL 5 MG/ML IV SOLN
10.0000 mg | INTRAVENOUS | Status: DC | PRN
  Administered 2020-06-19: 10 mg via INTRAVENOUS

## 2020-06-19 MED ORDER — SODIUM CHLORIDE 0.9 % IV SOLN: 250.0000 mL | INTRAVENOUS | Status: DC | PRN

## 2020-06-19 MED ORDER — NOREPINEPHRINE 4 MG/250ML-% IV SOLN
0.0000 ug/min | INTRAVENOUS | Status: DC
  Administered 2020-06-19: 10 ug/min via INTRAVENOUS
  Filled 2020-06-19: qty 250

## 2020-06-19 MED ORDER — SODIUM CHLORIDE 0.9 % WEIGHT BASED INFUSION: 1.0000 mL/kg/h | INTRAVENOUS | Status: DC

## 2020-06-19 MED ORDER — ENOXAPARIN SODIUM 40 MG/0.4ML ~~LOC~~ SOLN
40.0000 mg | SUBCUTANEOUS | Status: DC
  Administered 2020-06-20: 10:00:00 40 mg via SUBCUTANEOUS
  Filled 2020-06-19: qty 0.4

## 2020-06-19 MED ORDER — ASPIRIN 81 MG PO CHEW: 81.0000 mg | CHEWABLE_TABLET | Freq: Every day | ORAL | Status: DC

## 2020-06-19 MED ORDER — HEPARIN SODIUM (PORCINE) 1000 UNIT/ML IJ SOLN
INTRAMUSCULAR | Status: AC
  Filled 2020-06-19: qty 1

## 2020-06-19 MED ORDER — ROSUVASTATIN CALCIUM 20 MG PO TABS
40.0000 mg | ORAL_TABLET | Freq: Every day | ORAL | Status: DC
  Administered 2020-06-20 – 2020-06-23 (×4): 40 mg via ORAL
  Filled 2020-06-19 (×5): qty 2

## 2020-06-19 MED ORDER — ASPIRIN EC 81 MG PO TBEC
81.0000 mg | DELAYED_RELEASE_TABLET | Freq: Every day | ORAL | Status: DC
  Administered 2020-06-20 – 2020-06-23 (×4): 81 mg via ORAL
  Filled 2020-06-19 (×4): qty 1

## 2020-06-19 MED ORDER — SODIUM CHLORIDE 0.9 % IV SOLN
250.0000 mL | INTRAVENOUS | Status: DC | PRN
  Administered 2020-06-21: 22:00:00 250 mL via INTRAVENOUS

## 2020-06-19 MED ORDER — SODIUM CHLORIDE 0.9% FLUSH: 3.0000 mL | INTRAVENOUS | Status: DC | PRN

## 2020-06-19 MED ORDER — NITROGLYCERIN 0.4 MG SL SUBL: 0.4000 mg | SUBLINGUAL_TABLET | SUBLINGUAL | Status: DC | PRN

## 2020-06-19 MED ORDER — IOHEXOL 350 MG/ML SOLN
INTRAVENOUS | Status: DC | PRN
  Administered 2020-06-19: 45 mL

## 2020-06-19 MED ORDER — CARVEDILOL 6.25 MG PO TABS: 6.2500 mg | ORAL_TABLET | Freq: Two times a day (BID) | ORAL | Status: DC

## 2020-06-19 MED ORDER — ASPIRIN 81 MG PO CHEW: 81.0000 mg | CHEWABLE_TABLET | ORAL | Status: DC

## 2020-06-19 MED ORDER — LIDOCAINE HCL (PF) 1 % IJ SOLN
INTRAMUSCULAR | Status: AC
  Filled 2020-06-19: qty 30

## 2020-06-19 MED ORDER — HEPARIN (PORCINE) IN NACL 1000-0.9 UT/500ML-% IV SOLN
INTRAVENOUS | Status: DC | PRN
  Administered 2020-06-19 (×2): 500 mL

## 2020-06-19 MED ORDER — FENTANYL CITRATE (PF) 100 MCG/2ML IJ SOLN
INTRAMUSCULAR | Status: AC
  Filled 2020-06-19: qty 2

## 2020-06-19 MED ORDER — SODIUM CHLORIDE 0.9% FLUSH
3.0000 mL | Freq: Two times a day (BID) | INTRAVENOUS | Status: DC
  Administered 2020-06-20 – 2020-06-25 (×10): 3 mL via INTRAVENOUS

## 2020-06-19 MED ORDER — ALLOPURINOL 100 MG PO TABS
100.0000 mg | ORAL_TABLET | Freq: Every day | ORAL | Status: DC
  Administered 2020-06-20 – 2020-06-23 (×4): 100 mg via ORAL
  Filled 2020-06-19 (×4): qty 1

## 2020-06-19 MED ORDER — HYDRALAZINE HCL 20 MG/ML IJ SOLN
10.0000 mg | INTRAMUSCULAR | Status: DC | PRN
  Administered 2020-06-19 (×2): 10 mg via INTRAVENOUS

## 2020-06-19 MED ORDER — DONEPEZIL HCL 10 MG PO TABS
10.0000 mg | ORAL_TABLET | Freq: Every day | ORAL | Status: DC
  Administered 2020-06-20 – 2020-06-23 (×4): 10 mg via ORAL
  Filled 2020-06-19 (×4): qty 1

## 2020-06-19 MED ORDER — HYDRALAZINE HCL 20 MG/ML IJ SOLN: 5.0000 mg | Freq: Once | INTRAMUSCULAR | Status: DC

## 2020-06-19 MED ORDER — VERAPAMIL HCL 2.5 MG/ML IV SOLN
INTRAVENOUS | Status: DC | PRN
  Administered 2020-06-19: 10 mL via INTRA_ARTERIAL

## 2020-06-19 MED ORDER — SODIUM CHLORIDE 0.9% FLUSH: 3.0000 mL | Freq: Two times a day (BID) | INTRAVENOUS | Status: DC

## 2020-06-19 MED ORDER — SODIUM CHLORIDE 0.9 % IV SOLN: INTRAVENOUS | Status: DC

## 2020-06-19 MED ORDER — LEVETIRACETAM 500 MG PO TABS
500.0000 mg | ORAL_TABLET | Freq: Two times a day (BID) | ORAL | Status: DC
  Administered 2020-06-20 – 2020-06-23 (×8): 500 mg via ORAL
  Filled 2020-06-19: qty 2
  Filled 2020-06-19 (×2): qty 1
  Filled 2020-06-19: qty 2
  Filled 2020-06-19 (×4): qty 1

## 2020-06-19 MED ORDER — HEPARIN (PORCINE) IN NACL 1000-0.9 UT/500ML-% IV SOLN
INTRAVENOUS | Status: AC
  Filled 2020-06-19: qty 1000

## 2020-06-19 MED ORDER — ACETAMINOPHEN 325 MG PO TABS
650.0000 mg | ORAL_TABLET | ORAL | Status: DC | PRN
  Administered 2020-06-19 – 2020-06-24 (×2): 650 mg via ORAL
  Filled 2020-06-19 (×2): qty 2

## 2020-06-19 MED ORDER — AMLODIPINE BESYLATE 10 MG PO TABS: 10.0000 mg | ORAL_TABLET | Freq: Every day | ORAL | Status: DC

## 2020-06-19 MED ORDER — LABETALOL HCL 5 MG/ML IV SOLN
INTRAVENOUS | Status: AC
  Filled 2020-06-19: qty 4

## 2020-06-19 MED ORDER — MIDAZOLAM HCL 2 MG/2ML IJ SOLN
INTRAMUSCULAR | Status: AC
  Filled 2020-06-19: qty 2

## 2020-06-19 SURGICAL SUPPLY — 15 items
CATH INFINITI 5FR MULTPACK ANG (CATHETERS) ×2 IMPLANT
CATH OPTITORQUE TIG 4.0 5F (CATHETERS) ×2 IMPLANT
DEVICE RAD COMP TR BAND LRG (VASCULAR PRODUCTS) ×2 IMPLANT
GLIDESHEATH SLEND SS 6F .021 (SHEATH) ×2 IMPLANT
GUIDEWIRE INQWIRE 1.5J.035X260 (WIRE) ×1 IMPLANT
INQWIRE 1.5J .035X260CM (WIRE) ×2
KIT HEART LEFT (KITS) ×2 IMPLANT
PACK CARDIAC CATHETERIZATION (CUSTOM PROCEDURE TRAY) ×2 IMPLANT
SHEATH PINNACLE 5F 10CM (SHEATH) ×2 IMPLANT
SHEATH PROBE COVER 6X72 (BAG) ×2 IMPLANT
TRANSDUCER W/STOPCOCK (MISCELLANEOUS) ×2 IMPLANT
TUBING CIL FLEX 10 FLL-RA (TUBING) ×2 IMPLANT
WIRE EMERALD 3MM-J .035X150CM (WIRE) ×2 IMPLANT
WIRE EMERALD ST .035X150CM (WIRE) ×2 IMPLANT
WIRE HI TORQ VERSACORE-J 145CM (WIRE) ×2 IMPLANT

## 2020-06-19 NOTE — Progress Notes (Signed)
TCTS consulted for CABG evaluation. °

## 2020-06-19 NOTE — Significant Event (Signed)
Rapid Response Event Note   Reason for Call :  Hypotension-58/50, pt diaphoretic, c/o ABD pain.  Pt received 10mg  labetalol IV at 1553 and 10mg  hydralazine x 2 at 1618 and 1709 for BP-186/92.  Initial Focused Assessment:  Pt laying in bed with eyes closed. He will open eyes to command, follow commands, and move all extremities. Pt c/o chest/ABD pain 10/10. Lungs diminished t/o. Skin pale, cool to touch, and diaphoretic. R groin cath site level "0."  HR-71, BP-69/59, RR-15, SpO2-98% on RA    Interventions:  1L NS bolus CBC/BMP/LA BP meds d/c'd  2245-SBP 50s again after receiving 1L NS bolus, HR dropping to 50s and pt having apneic/unresponsive episodes. Dr. Marcelle Smiling back to bedside, Levo started at 17mcg, and pt transferred to 2H04.   Plan of Care:  Pt tx to 2H04   Event Summary:   MD Notified: Dr. Marcelle Smiling notifed PTA RRT and at bedside on my arrival Call [place] Time:2330  Dillard [place], RN

## 2020-06-19 NOTE — Interval H&P Note (Signed)
Cath Lab Visit (complete for each Cath Lab visit)  Clinical Evaluation Leading to the Procedure:   ACS: No.  Non-ACS:    Anginal Classification: CCS III  Anti-ischemic medical therapy: Maximal Therapy (2 or more classes of medications)  Non-Invasive Test Results: High-risk stress test findings: cardiac mortality >3%/year  Prior CABG: No previous CABG      History and Physical Interval Note:  06/30/2020 2:16 PM  Isaiah Davidson  has presented today for surgery, with the diagnosis of CAD.  The various methods of treatment have been discussed with the patient and family. After consideration of risks, benefits and other options for treatment, the patient has consented to  Procedure(s): LEFT HEART CATH AND CORONARY ANGIOGRAPHY (N/A) as a surgical intervention.  The patient's history has been reviewed, patient examined, no change in status, stable for surgery.  I have reviewed the patient's chart and labs.  Questions were answered to the patient's satisfaction.     Shelva Majestic

## 2020-06-19 NOTE — Progress Notes (Signed)
Site area: right groin  Site Prior to Removal:  Level 0  Pressure Applied For 16  MINUTES    Minutes Beginning at 1640  Manual:   Yes.    Patient Status During Pull:  Stable   Post Pull Groin Site:  Level 0  Post Pull Instructions Given:  Yes.    Post Pull Pulses Present:  Yes.    Dressing Applied:  Yes.    Comments:  Bed rest started at 1655 X 4 hr.

## 2020-06-20 ENCOUNTER — Inpatient Hospital Stay (HOSPITAL_COMMUNITY): Payer: Medicare HMO

## 2020-06-20 DIAGNOSIS — I2510 Atherosclerotic heart disease of native coronary artery without angina pectoris: Secondary | ICD-10-CM

## 2020-06-20 DIAGNOSIS — I361 Nonrheumatic tricuspid (valve) insufficiency: Secondary | ICD-10-CM

## 2020-06-20 DIAGNOSIS — Z01810 Encounter for preprocedural cardiovascular examination: Secondary | ICD-10-CM

## 2020-06-20 DIAGNOSIS — E782 Mixed hyperlipidemia: Secondary | ICD-10-CM

## 2020-06-20 LAB — CBC
HCT: 35.7 % — ABNORMAL LOW (ref 39.0–52.0)
Hemoglobin: 11.9 g/dL — ABNORMAL LOW (ref 13.0–17.0)
MCH: 30.7 pg (ref 26.0–34.0)
MCHC: 33.3 g/dL (ref 30.0–36.0)
MCV: 92 fL (ref 80.0–100.0)
Platelets: 199 10*3/uL (ref 150–400)
RBC: 3.88 MIL/uL — ABNORMAL LOW (ref 4.22–5.81)
RDW: 12.9 % (ref 11.5–15.5)
WBC: 10.1 10*3/uL (ref 4.0–10.5)
nRBC: 0 % (ref 0.0–0.2)

## 2020-06-20 LAB — ECHOCARDIOGRAM COMPLETE
AR max vel: 0.88 cm2
AV Area VTI: 0.98 cm2
AV Area mean vel: 0.98 cm2
AV Mean grad: 4 mmHg
AV Peak grad: 7.6 mmHg
Ao pk vel: 1.38 m/s
Area-P 1/2: 4.41 cm2
Calc EF: 35.9 %
Height: 69 in
MV M vel: 4.39 m/s
MV Peak grad: 77.1 mmHg
Radius: 0.5 cm
S' Lateral: 3.2 cm
Single Plane A2C EF: 30.4 %
Single Plane A4C EF: 42.5 %
Weight: 2585.55 oz

## 2020-06-20 LAB — BASIC METABOLIC PANEL
Anion gap: 14 (ref 5–15)
BUN: 16 mg/dL (ref 8–23)
CO2: 20 mmol/L — ABNORMAL LOW (ref 22–32)
Calcium: 9.1 mg/dL (ref 8.9–10.3)
Chloride: 104 mmol/L (ref 98–111)
Creatinine, Ser: 1.84 mg/dL — ABNORMAL HIGH (ref 0.61–1.24)
GFR, Estimated: 35 mL/min — ABNORMAL LOW (ref >60)
Glucose, Bld: 145 mg/dL — ABNORMAL HIGH (ref 70–99)
Potassium: 4.5 mmol/L (ref 3.5–5.1)
Sodium: 138 mmol/L (ref 135–145)

## 2020-06-20 LAB — LACTATE DEHYDROGENASE: LDH: 569 U/L — ABNORMAL HIGH (ref 98–192)

## 2020-06-20 LAB — MAGNESIUM: Magnesium: 2 mg/dL (ref 1.7–2.4)

## 2020-06-20 LAB — LACTIC ACID, PLASMA: Lactic Acid, Venous: 4.5 mmol/L (ref 0.5–1.9)

## 2020-06-20 MED ORDER — CHLORHEXIDINE GLUCONATE CLOTH 2 % EX PADS
6.0000 | MEDICATED_PAD | Freq: Every day | CUTANEOUS | Status: DC
  Administered 2020-06-22 – 2020-06-25 (×4): 6 via TOPICAL

## 2020-06-20 MED ORDER — METOPROLOL TARTRATE 12.5 MG HALF TABLET
12.5000 mg | ORAL_TABLET | Freq: Two times a day (BID) | ORAL | Status: DC
  Administered 2020-06-20 – 2020-06-21 (×2): 12.5 mg via ORAL
  Filled 2020-06-20 (×2): qty 1

## 2020-06-20 MED ORDER — INFLUENZA VAC A&B SA ADJ QUAD 0.5 ML IM PRSY
0.5000 mL | PREFILLED_SYRINGE | INTRAMUSCULAR | Status: DC
  Filled 2020-06-20: qty 0.5

## 2020-06-20 MED ORDER — ENOXAPARIN SODIUM 30 MG/0.3ML ~~LOC~~ SOLN: 30.0000 mg | SUBCUTANEOUS | Status: DC

## 2020-06-20 MED ORDER — HEPARIN BOLUS VIA INFUSION
3000.0000 [IU] | Freq: Once | INTRAVENOUS | Status: AC
  Administered 2020-06-20: 16:00:00 3000 [IU] via INTRAVENOUS
  Filled 2020-06-20: qty 3000

## 2020-06-20 MED ORDER — PERFLUTREN LIPID MICROSPHERE
1.0000 mL | INTRAVENOUS | Status: AC | PRN
  Filled 2020-06-20: qty 10

## 2020-06-20 MED ORDER — HEPARIN (PORCINE) 25000 UT/250ML-% IV SOLN
1050.0000 [IU]/h | INTRAVENOUS | Status: DC
  Administered 2020-06-20: 16:00:00 900 [IU]/h via INTRAVENOUS
  Administered 2020-06-21: 22:00:00 600 [IU]/h via INTRAVENOUS
  Administered 2020-06-23 – 2020-06-24 (×2): 1050 [IU]/h via INTRAVENOUS
  Filled 2020-06-20 (×5): qty 250

## 2020-06-20 NOTE — Progress Notes (Signed)
  Echocardiogram 2D Echocardiogram has been performed.  Isaiah Davidson 06/20/2020, 11:20 AM

## 2020-06-20 NOTE — Consult Note (Signed)
TCTS Preliminary Consult Note  Consult received for consideration of CABG. I have reviewed his films and agree that surgery should be considered based on the pathology, but we need to complete the full work-up in order to determine the safety in pursing this option. Full report to follow.   Aubrianne Molyneux Z. Orvan Seen, [place]

## 2020-06-20 NOTE — Progress Notes (Addendum)
Progress Note  Patient Name: Isaiah Davidson Date of Encounter: 06/20/2020  [facility] Cardiologist: New patient/Dr Gasper Sells  Subjective   No chest pain or SOB.  Inpatient Medications    Scheduled Meds: . allopurinol  100 mg Oral Daily  . aspirin EC  81 mg Oral Daily  . Chlorhexidine Gluconate Cloth  6 each Topical Daily  . donepezil  10 mg Oral QHS  . enoxaparin (LOVENOX) injection  40 mg Subcutaneous Q24H  . levETIRAcetam  500 mg Oral BID  . rosuvastatin  40 mg Oral Daily  . sodium chloride flush  3 mL Intravenous Q12H   Continuous Infusions: . sodium chloride 100 mL/hr at 06/20/20 0400  . sodium chloride    . norepinephrine (LEVOPHED) Adult infusion Stopped (06/20/20 0216)   PRN Meds: sodium chloride, acetaminophen, nitroGLYCERIN, ondansetron (ZOFRAN) IV, sodium chloride flush   Vital Signs    Vitals:   06/20/20 0600 06/20/20 0700 06/20/20 0730 06/20/20 0800  BP: (!) 88/71 91/69  96/77  Pulse: 80 82  78  Resp: (!) 24 (!) 23  12  Temp:   97.6 F (36.4 C)   TempSrc:   Oral   SpO2: 100% 100%  100%  Weight:      Height:        Intake/Output Summary (Last 24 hours) at 06/20/2020 0913 Last data filed at 06/20/2020 0400 Gross per 24 hour  Intake 1324.7 ml  Output --  Net 1324.7 ml   Last 3 Weights 06/14/2020 06/16/2020 06/08/2020  Weight (lbs) 161 lb 9.6 oz 161 lb 9.6 oz 164 lb 6.4 oz  Weight (kg) 73.3 kg 73.301 kg 74.571 kg      Telemetry    SR 70-90 BPM, frequent nsVTs up to 8 beats - Personally Reviewed  ECG    No new tracing - Personally Reviewed  Physical Exam  NAD GEN: No acute distress.   Neck: up to the jaws JVD Cardiac: RRR, no murmurs, rubs, or gallops.  Respiratory: Clear to auscultation bilaterally. GI: Soft, nontender, non-distended  MS: No edema; No deformity. Neuro:  Nonfocal  Psych: Normal affect   Labs    High Sensitivity Troponin:  No results for input(s): TROPONINIHS in the last 720 hours.     Chemistry Recent Labs  Lab 06/16/20 1052 06/13/2020 1755 07/03/2020 2232 06/20/20 0119  NA 136  --  136 138  K 4.5  --  3.6 4.5  CL 99  --  103 104  CO2 24  --  20* 20*  GLUCOSE 84  --  118* 145*  BUN 12  --  15 16  CREATININE 1.42* 1.22 1.62* 1.84*  CALCIUM 9.5  --  8.5* 9.1  GFRNONAA 44* 58* 41* 35*  GFRAA 51*  --   --   --   ANIONGAP  --   --  13 14     Hematology Recent Labs  Lab 06/24/2020 1755 06/18/2020 2232 06/20/20 0119  WBC 4.5 6.2 10.1  RBC 3.82* 3.50* 3.88*  HGB 12.1* 11.1* 11.9*  HCT 33.7* 31.4* 35.7*  MCV 88.2 89.7 92.0  MCH 31.7 31.7 30.7  MCHC 35.9 35.4 33.3  RDW 12.8 12.9 12.9  PLT 187 184 199    BNPNo results for input(s): BNP, PROBNP in the last 168 hours.   DDimer No results for input(s): DDIMER in the last 168 hours.   Radiology    CARDIAC CATHETERIZATION  Result Date: 06/22/2020  Mid LM to Dist LM lesion is 85% stenosed.  Ost LAD to Prox LAD lesion is 40% stenosed.  2nd Diag lesion is 90% stenosed.  Lat 2nd Diag lesion is 80% stenosed.  Ost Cx lesion is 30% stenosed.  Prox Cx lesion is 70% stenosed.  Mid Cx to Dist Cx lesion is 80% stenosed.  Prox RCA lesion is 70% stenosed.  RPDA-1 lesion is 70% stenosed.  RPDA-2 lesion is 95% stenosed.  2nd RPL lesion is 50% stenosed.  Mid LAD to Dist LAD lesion is 50% stenosed.  Dist RCA lesion is 50% stenosed.  Severe coronary calcification and multivessel CAD with focal 85% distal left main stenosis; diffuse 40% LAD stenosis with 50% mid stenosis and 90 and 80% stenoses in the proximal and mid diagonal vessel; 30% ostial circumflex stenosis with 70% mid stenosis and 80% stenosis in the second marginal branch; diffusely irregular dominant RCA with 70% mid stenosis, 60% stenosis proximal to the PDA takeoff with 70 and 95% proximal and distal PDA stenoses and 50% inferior LV stenosis. Unsuccessful attempt at crossing the aortic valve. RECOMMENDATION: Surgical consultation for CABG revascularization  surgery.  We will schedule patient for 2D echo Doppler study.  Aggressive lipid-lowering therapy.  Patient has significant hypertension, increase medical therapy pending surgical revascularization.   Cardiac Studies   Cardiac cath 06/28/2020   Mid LM to Dist LM lesion is 85% stenosed.  Ost LAD to Prox LAD lesion is 40% stenosed.  2nd Diag lesion is 90% stenosed.  Lat 2nd Diag lesion is 80% stenosed.  Ost Cx lesion is 30% stenosed.  Prox Cx lesion is 70% stenosed.  Mid Cx to Dist Cx lesion is 80% stenosed.  Prox RCA lesion is 70% stenosed.  RPDA-1 lesion is 70% stenosed.  RPDA-2 lesion is 95% stenosed.  2nd RPL lesion is 50% stenosed.  Mid LAD to Dist LAD lesion is 50% stenosed.  Dist RCA lesion is 50% stenosed.   Severe coronary calcification and multivessel CAD with focal 85% distal left main stenosis; diffuse 40% LAD stenosis with 50% mid stenosis and 90 and 80% stenoses in the proximal and mid diagonal vessel; 30% ostial circumflex stenosis with 70% mid stenosis and 80% stenosis in the second marginal branch; diffusely irregular dominant RCA with 70% mid stenosis, 60% stenosis proximal to the PDA takeoff with 70 and 95% proximal and distal PDA stenoses and 50% inferior LV stenosis.  Unsuccessful attempt at crossing the aortic valve.  RECOMMENDATION: Surgical consultation for CABG revascularization surgery.  We will schedule patient for 2D echo Doppler study.  Aggressive lipid-lowering therapy.  Patient has significant hypertension, increase medical therapy pending surgical revascularization.   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 - continue ASA 81 mg PO Daily - continue statin - CABG was recommended, Dr Orvan Seen to see and decide - the patient is currently asymptomatic, getting carotid US - telemetry shows frequent short runs of nsVT, I will start metoprolol 12.5 mg po BID -  echo is pending, LVEF in 2018 60-65% - elevated JVDs, no crackles on lungs, I will obtain CXR  Essential Hypertension - low since cath, on levophed overnight, now off, low BP today, encouraged to increase oral intake  Mild to Moderate TR Severe LVH, without LA enlargement Carpal Tunnel Surgery - Coreg 6.25 mG BID - euvolemic presently  Acute on chronic kidney failure  - Crea 1.4 -> 1.8 - contrast induced - increase oral fluid intake, follow tomorrow  He can be transferred to telemetry if beds are needed  For questions or updates, please contact [facility] Please consult [email or web address] for contact info under     Signed, Ena Dawley, MD  06/20/2020, 9:13 AM

## 2020-06-20 NOTE — Progress Notes (Signed)
Wife notified via phone of patients transfer to 2C08

## 2020-06-20 NOTE — Progress Notes (Addendum)
ANTICOAGULATION CONSULT NOTE - Initial Consult  Pharmacy Consult for heparin  Indication: LV thrombus  No Known Allergies  Patient Measurements: Height: 5\' 9"  (175.3 cm) Weight: 73.3 kg (161 lb 9.6 oz) IBW/kg (Calculated) : 70.7 Heparin Dosing Weight: 73kg  Vital Signs: Temp: 98.1 F (36.7 C) (12/11 1125) Temp Source: Oral (12/11 1125) BP: 107/84 (12/11 1300) Pulse Rate: 85 (12/11 1300)  Labs: Recent Labs    06/28/2020 1755 06/27/2020 2232 06/20/20 0119  HGB 12.1* 11.1* 11.9*  HCT 33.7* 31.4* 35.7*  PLT 187 184 199  CREATININE 1.22 1.62* 1.84*    Estimated Creatinine Clearance: 28.8 mL/min (A) (by C-G formula based on SCr of 1.84 mg/dL (H)).   Medical History: Past Medical History:  Diagnosis Date  . Heart murmur   . HYPERLIPIDEMIA 02/22/2007  . HYPERTENSION 02/22/2007  . MCI (mild cognitive impairment) with memory loss 05/14/2014  . Seizures ([place])    treated for siezures around 2010    Assessment: 86 year old male presented to [facility] for diagnostic cath for ongoing angina found to have multivessel disease. Currently awaiting surgical consultation.   Echo this  Morning showed low ef and LV thrombus, new orders to start IV heparin. It does not appear that he was on blood thinners prior to admit. CBC appears close to wnl given age.   Received sq lovenox ~10am, will reduce heparin bolus.   Goal of Therapy:  Heparin level 0.3-0.7 units/ml Monitor platelets by anticoagulation protocol: Yes   Plan:  Give 3000 units bolus x 1 Start heparin infusion at 900 units/hr Check anti-Xa level in 8 hours and daily while on heparin Continue to monitor H&H and platelets  Erin Hearing PharmD., BCPS Clinical Pharmacist 06/20/2020 2:37 PM

## 2020-06-20 NOTE — Progress Notes (Signed)
Patient being transferred to [facility]. Only noted belongings at bedside are upper and lower dentures ( in patients mouth) and eye glasses with case. Will notify wife of transfer once bed assigned.

## 2020-06-20 NOTE — Progress Notes (Signed)
VASCULAR LAB    Pre CABG Dopplers have been performed.  See CV proc for preliminary results.   Jiovany Scheffel, RVT 06/20/2020, 1:11 PM

## 2020-06-20 NOTE — Progress Notes (Signed)
Pneumococcal Conjugate-13 07/21/2014

## 2020-06-21 ENCOUNTER — Encounter (HOSPITAL_COMMUNITY): Payer: Self-pay | Admitting: Cardiovascular Disease

## 2020-06-21 ENCOUNTER — Inpatient Hospital Stay (HOSPITAL_COMMUNITY): Payer: Medicare HMO

## 2020-06-21 DIAGNOSIS — I513 Intracardiac thrombosis, not elsewhere classified: Secondary | ICD-10-CM

## 2020-06-21 DIAGNOSIS — I255 Ischemic cardiomyopathy: Secondary | ICD-10-CM

## 2020-06-21 LAB — CBC
HCT: 34.4 % — ABNORMAL LOW (ref 39.0–52.0)
Hemoglobin: 11.8 g/dL — ABNORMAL LOW (ref 13.0–17.0)
MCH: 31.1 pg (ref 26.0–34.0)
MCHC: 34.3 g/dL (ref 30.0–36.0)
MCV: 90.8 fL (ref 80.0–100.0)
Platelets: 187 10*3/uL (ref 150–400)
RBC: 3.79 MIL/uL — ABNORMAL LOW (ref 4.22–5.81)
RDW: 13.2 % (ref 11.5–15.5)
WBC: 6.2 10*3/uL (ref 4.0–10.5)
nRBC: 0 % (ref 0.0–0.2)

## 2020-06-21 LAB — COOXEMETRY PANEL
Carboxyhemoglobin: 0.7 % (ref 0.5–1.5)
Carboxyhemoglobin: 0.9 % (ref 0.5–1.5)
Methemoglobin: 0.8 % (ref 0.0–1.5)
Methemoglobin: 0.9 % (ref 0.0–1.5)
O2 Saturation: 42.3 %
O2 Saturation: 92.9 %
Total hemoglobin: 10.8 g/dL — ABNORMAL LOW (ref 12.0–16.0)
Total hemoglobin: 11 g/dL — ABNORMAL LOW (ref 12.0–16.0)

## 2020-06-21 LAB — BASIC METABOLIC PANEL
Anion gap: 15 (ref 5–15)
BUN: 40 mg/dL — ABNORMAL HIGH (ref 8–23)
CO2: 19 mmol/L — ABNORMAL LOW (ref 22–32)
Calcium: 8.9 mg/dL (ref 8.9–10.3)
Chloride: 102 mmol/L (ref 98–111)
Creatinine, Ser: 2.79 mg/dL — ABNORMAL HIGH (ref 0.61–1.24)
GFR, Estimated: 21 mL/min — ABNORMAL LOW (ref >60)
Glucose, Bld: 124 mg/dL — ABNORMAL HIGH (ref 70–99)
Potassium: 5.2 mmol/L — ABNORMAL HIGH (ref 3.5–5.1)
Sodium: 136 mmol/L (ref 135–145)

## 2020-06-21 LAB — HEPARIN LEVEL (UNFRACTIONATED)
Heparin Unfractionated: 0.98 IU/mL — ABNORMAL HIGH (ref 0.30–0.70)
Heparin Unfractionated: 0.91 IU/mL — ABNORMAL HIGH (ref 0.30–0.70)

## 2020-06-21 MED ORDER — FENTANYL CITRATE (PF) 100 MCG/2ML IJ SOLN
25.0000 ug | Freq: Once | INTRAMUSCULAR | Status: AC
Start: 2020-06-21 — End: 2020-06-21
  Administered 2020-06-21: 14:00:00 25 ug via INTRAVENOUS
  Filled 2020-06-21: qty 2

## 2020-06-21 MED ORDER — DOBUTAMINE IN D5W 4-5 MG/ML-% IV SOLN
2.5000 ug/kg/min | INTRAVENOUS | Status: DC
  Administered 2020-06-21: 2.5 ug/kg/min via INTRAVENOUS
  Filled 2020-06-21: qty 250

## 2020-06-21 MED ORDER — ENSURE ENLIVE PO LIQD
237.0000 mL | Freq: Two times a day (BID) | ORAL | Status: DC
  Administered 2020-06-21 – 2020-06-22 (×3): 237 mL via ORAL

## 2020-06-21 MED ORDER — MILRINONE LACTATE IN DEXTROSE 20-5 MG/100ML-% IV SOLN: 0.2500 ug/kg/min | INTRAVENOUS | Status: DC

## 2020-06-21 NOTE — Progress Notes (Addendum)
Progress Note  Patient Name: Isaiah Davidson Date of Encounter: 06/21/2020  [facility] Cardiologist: New patient/Dr Gasper Sells  Subjective   No events overnight. No chest pain or SOB.  Inpatient Medications    Scheduled Meds: . allopurinol  100 mg Oral Daily  . aspirin EC  81 mg Oral Daily  . Chlorhexidine Gluconate Cloth  6 each Topical Daily  . donepezil  10 mg Oral QHS  . influenza vaccine adjuvanted  0.5 mL Intramuscular Tomorrow-1000  . levETIRAcetam  500 mg Oral BID  . metoprolol tartrate  12.5 mg Oral BID  . rosuvastatin  40 mg Oral Daily  . sodium chloride flush  3 mL Intravenous Q12H   Continuous Infusions: . sodium chloride 100 mL/hr at 06/20/20 2200  . sodium chloride    . heparin 750 Units/hr (06/21/20 0333)  . norepinephrine (LEVOPHED) Adult infusion Stopped (06/20/20 0216)   PRN Meds: sodium chloride, acetaminophen, nitroGLYCERIN, ondansetron (ZOFRAN) IV, sodium chloride flush   Vital Signs    Vitals:   06/20/20 2300 06/21/20 0000 06/21/20 0400 06/21/20 0731  BP: 116/89 112/83 114/81 113/81  Pulse: 78 82 76 76  Resp: 15 (!) 24 17 20   Temp: 98.3 F (36.8 C) 98.3 F (36.8 C) 98.5 F (36.9 C) 98.1 F (36.7 C)  TempSrc: Oral Oral Oral Oral  SpO2: 100% 92% 97% 97%  Weight:      Height:        Intake/Output Summary (Last 24 hours) at 06/21/2020 0759 Last data filed at 06/21/2020 0745 Gross per 24 hour  Intake 1060.94 ml  Output 300 ml  Net 760.94 ml   Last 3 Weights 07/08/2020 06/16/2020 06/08/2020  Weight (lbs) 161 lb 9.6 oz 161 lb 9.6 oz 164 lb 6.4 oz  Weight (kg) 73.3 kg 73.301 kg 74.571 kg      Telemetry    SR 70-90 BPM, frequent nsVTs up to 8 beats - Personally Reviewed  ECG    No new tracing - Personally Reviewed  Physical Exam  NAD GEN: No acute distress.   Neck: up to the jaws JVD Cardiac: RRR, no murmurs, rubs, or gallops.  Respiratory: Clear to auscultation bilaterally. GI: Soft, nontender, non-distended   MS: No edema; No deformity. Neuro:  Nonfocal  Psych: Normal affect   Labs    High Sensitivity Troponin:  No results for input(s): TROPONINIHS in the last 720 hours.    Chemistry Recent Labs  Lab 06/16/20 1052 06/24/2020 1755 06/18/2020 2232 06/20/20 0119 06/21/20 0104  NA 136  --  136 138 136  K 4.5  --  3.6 4.5 5.2*  CL 99  --  103 104 102  CO2 24  --  20* 20* 19*  GLUCOSE 84  --  118* 145* 124*  BUN 12  --  15 16 40*  CREATININE 1.42*   < > 1.62* 1.84* 2.79*  CALCIUM 9.5  --  8.5* 9.1 8.9  GFRNONAA 44*   < > 41* 35* 21*  GFRAA 51*  --   --   --   --   ANIONGAP  --   --  13 14 15    < > = values in this interval not displayed.     Hematology Recent Labs  Lab 06/18/2020 2232 06/20/20 0119 06/21/20 0104  WBC 6.2 10.1 6.2  RBC 3.50* 3.88* 3.79*  HGB 11.1* 11.9* 11.8*  HCT 31.4* 35.7* 34.4*  MCV 89.7 92.0 90.8  MCH 31.7 30.7 31.1  MCHC 35.4 33.3 34.3  RDW 12.9 12.9 13.2  PLT 184 199 187    BNPNo results for input(s): BNP, PROBNP in the last 168 hours.   DDimer No results for input(s): DDIMER in the last 168 hours.   Radiology    CARDIAC CATHETERIZATION  Result Date: 06/20/2020  Mid LM to Dist LM lesion is 85% stenosed.  Ost LAD to Prox LAD lesion is 40% stenosed.  2nd Diag lesion is 90% stenosed.  Lat 2nd Diag lesion is 80% stenosed.  Ost Cx lesion is 30% stenosed.  Prox Cx lesion is 70% stenosed.  Mid Cx to Dist Cx lesion is 80% stenosed.  Prox RCA lesion is 70% stenosed.  RPDA-1 lesion is 70% stenosed.  RPDA-2 lesion is 95% stenosed.  2nd RPL lesion is 50% stenosed.  Mid LAD to Dist LAD lesion is 50% stenosed.  Dist RCA lesion is 50% stenosed.  Severe coronary calcification and multivessel CAD with focal 85% distal left main stenosis; diffuse 40% LAD stenosis with 50% mid stenosis and 90 and 80% stenoses in the proximal and mid diagonal vessel; 30% ostial circumflex stenosis with 70% mid stenosis and 80% stenosis in the second marginal branch; diffusely  irregular dominant RCA with 70% mid stenosis, 60% stenosis proximal to the PDA takeoff with 70 and 95% proximal and distal PDA stenoses and 50% inferior LV stenosis. Unsuccessful attempt at crossing the aortic valve. RECOMMENDATION: Surgical consultation for CABG revascularization surgery.  We will schedule patient for 2D echo Doppler study.  Aggressive lipid-lowering therapy.  Patient has significant hypertension, increase medical therapy pending surgical revascularization.   DG CHEST PORT 1 VIEW  Result Date: 06/20/2020 CLINICAL DATA:  Congestive heart failure. EXAM: PORTABLE CHEST 1 VIEW COMPARISON:  May 22, 2020. FINDINGS: The heart size and mediastinal contours are within normal limits. Both lungs are clear. The visualized skeletal structures are unremarkable. IMPRESSION: No active disease. Aortic Atherosclerosis (ICD10-I70.0). Electronically Signed   By: Marijo Conception M.D.   On: 06/20/2020 11:33   ECHOCARDIOGRAM COMPLETE  Result Date: 06/20/2020    ECHOCARDIOGRAM REPORT   Patient Name:   Isaiah Davidson Date of Exam: 06/20/2020 Medical Rec #:  [id number]            Height:       69.0 in Accession #:    [id number]           Weight:       161.6 lb Date of Birth:  1931/07/17            BSA:          1.887 m Patient Age:    86 years             BP:           96/77 mmHg Patient Gender: M                    HR:           78 bpm. Exam Location:  Inpatient Procedure: 2D Echo, Cardiac Doppler, Color Doppler and Intracardiac            Opacification Agent Indications:    CAD  History:        Patient has prior history of Echocardiogram examinations, most                 recent 06/22/2017. Angina; Risk Factors:Dyslipidemia,                 Hypertension and Former Smoker. DOE.  LVH.  Sonographer:    Clayton Lefort RDCS (AE) Referring Phys: [id number] [facility] ATKINS  Sonographer Comments: Definity use attempted to better visualize LV apex. After initial 2 ml dose, no Definity appeared on screen despite IV  flushing fine, even after several minutes. Nurse notified. IMPRESSIONS  1. Left ventricular ejection fraction, by estimation, is 25 to 30%. The left ventricle has severely decreased function.There is global hypokinesis with apical wall motion abnormalities as specified. There is severe left ventricular hypertrophy. Left ventricular diastolic parameters are consistent with Grade II diastolic dysfunction (pseudonormalization). A left ventricular thrombus is noted.  2. The aortic valve is tricuspid. There is severe calcifcation of the aortic valve. Aortic valve regurgitation is not visualized. Aortic valve area, by VTI measures 0.98 cm. Because left ventricular function and stroke volume are significantly reduced,  cannot exclude low flow low gradient aortic stenosis; significant valvular calcification.  3. Right ventricular systolic function is moderately reduced. The right ventricular size is normal. Mildly increased right ventricular wall thickness. There is mildly elevated pulmonary artery systolic pressure.  4. The mitral valve is grossly normal. Moderate mitral valve regurgitation.  5. Tricuspid valve regurgitation is mild to moderate.  6. The inferior vena cava is dilated in size with <50% respiratory variability, suggesting right atrial pressure of 15 mmHg. Comparison(s): A prior study was performed on 1213/2018. Significant changes have occured in the left ventricle, right ventricle, and aortic valve. Conclusion(s)/Recommendation(s): Discussed with primary cardiologist and heparin has been ordered. FINDINGS  Left Ventricle: LV Thombus Noted. Left ventricular ejection fraction, by estimation, is 25 to 30%. The left ventricle has severely decreased function. The left ventricle demonstrates regional wall motion abnormalities. Definity contrast agent was given IV to delineate the left ventricular endocardial borders. The left ventricular internal cavity size was small. There is severe left ventricular  hypertrophy. Left ventricular diastolic parameters are consistent with Grade II diastolic dysfunction (pseudonormalization).  LV Wall Scoring: The entire apex is akinetic. The mid anterolateral segment, mid inferoseptal segment, mid anterior segment, and mid inferior segment are hypokinetic. Right Ventricle: The right ventricular size is normal. Mildly increased right ventricular wall thickness. Right ventricular systolic function is moderately reduced. There is mildly elevated pulmonary artery systolic pressure. The tricuspid regurgitant velocity is 2.57 m/s, and with an assumed right atrial pressure of 15 mmHg, the estimated right ventricular systolic pressure is 45.4 mmHg. Left Atrium: Left atrial size was normal in size. Right Atrium: Right atrial size was normal in size. Pericardium: There is no evidence of pericardial effusion. Mitral Valve: The mitral valve is grossly normal. Moderate mitral valve regurgitation. Tricuspid Valve: The tricuspid valve is normal in structure. Tricuspid valve regurgitation is mild to moderate. Aortic Valve: Because LVEF and stroke volume are significantly reduced, cannot exclude low flow low gradient aortic stenosis; significant valvular calcification. The aortic valve is tricuspid. There is severe calcifcation of the aortic valve. Aortic valve regurgitation is not visualized. Aortic valve mean gradient measures 4.0 mmHg. Aortic valve peak gradient measures 7.6 mmHg. Aortic valve area, by VTI measures 0.98 cm. Pulmonic Valve: The pulmonic valve was not well visualized. Pulmonic valve regurgitation is not visualized. Aorta: The aortic root and ascending aorta are structurally normal, with no evidence of dilitation. Venous: The inferior vena cava is dilated in size with less than 50% respiratory variability, suggesting right atrial pressure of 15 mmHg. IAS/Shunts: The atrial septum is grossly normal.  LEFT VENTRICLE PLAX 2D LVIDd:         3.70 cm  Diastology LVIDs:         3.20  cm     LV e' medial:    3.48 cm/s LV PW:         1.30 cm     LV E/e' medial:  15.8 LV IVS:        1.60 cm     LV e' lateral:   6.09 cm/s LVOT diam:     2.00 cm     LV E/e' lateral: 9.0 LV SV:         22 LV SV Index:   12 LVOT Area:     3.14 cm  LV Volumes (MOD) LV vol d, MOD A2C: 92.8 ml LV vol d, MOD A4C: 92.1 ml LV vol s, MOD A2C: 64.6 ml LV vol s, MOD A4C: 53.0 ml LV SV MOD A2C:     28.2 ml LV SV MOD A4C:     92.1 ml LV SV MOD BP:      33.5 ml RIGHT VENTRICLE            IVC RV Basal diam:  3.90 cm    IVC diam: 2.30 cm RV Mid diam:    3.10 cm RV S prime:     4.12 cm/s TAPSE (M-mode): 0.8 cm LEFT ATRIUM           Index       RIGHT ATRIUM           Index LA diam:      3.30 cm 1.75 cm/m  RA Area:     17.40 cm LA Vol (A2C): 52.7 ml 27.93 ml/m RA Volume:   53.30 ml  28.25 ml/m LA Vol (A4C): 46.0 ml 24.38 ml/m  AORTIC VALVE AV Area (Vmax):    0.88 cm AV Area (Vmean):   0.98 cm AV Area (VTI):     0.98 cm AV Vmax:           138.00 cm/s AV Vmean:          90.200 cm/s AV VTI:            0.226 m AV Peak Grad:      7.6 mmHg AV Mean Grad:      4.0 mmHg LVOT Vmax:         38.70 cm/s LVOT Vmean:        28.200 cm/s LVOT VTI:          0.070 m LVOT/AV VTI ratio: 0.31  AORTA Ao Root diam: 3.00 cm Ao Asc diam:  3.40 cm MITRAL VALVE                 TRICUSPID VALVE MV Area (PHT): 4.41 cm      TR Peak grad:   26.4 mmHg MV Decel Time: 172 msec      TR Vmax:        257.00 cm/s MR Peak grad:    77.1 mmHg MR Mean grad:    42.0 mmHg   SHUNTS MR Vmax:         439.00 cm/s Systemic VTI:  0.07 m MR Vmean:        293.0 cm/s  Systemic Diam: 2.00 cm MR PISA:         1.57 cm MR PISA Eff ROA: 14 mm MR PISA Radius:  0.50 cm MV E velocity: 55.10 cm/s MV A velocity: 52.40 cm/s MV E/A ratio:  1.05 Rudean Haskell MD Electronically signed by Rudean Haskell MD Signature Date/Time: 06/20/2020/12:50:56 PM  Final    VAS US DOPPLER PRE CABG  Result Date: 06/20/2020 PREOPERATIVE VASCULAR EVALUATION  Risk Factors:     Hypertension,  hyperlipidemia, coronary artery disease. Comparison Study: No prior study Performing Technologist: Darlin Coco  Examination Guidelines: A complete evaluation includes B-mode imaging, spectral Doppler, color Doppler, and power Doppler as needed of all accessible portions of each vessel. Bilateral testing is considered an integral part of a complete examination. Limited examinations for reoccurring indications may be performed as noted.  Right Carotid Findings: +----------+--------+--------+--------+------------+------------------+           PSV cm/sEDV cm/sStenosisDescribe    Comments           +----------+--------+--------+--------+------------+------------------+ CCA Prox  63      17                          intimal thickening +----------+--------+--------+--------+------------+------------------+ CCA Distal41      12                          intimal thickening +----------+--------+--------+--------+------------+------------------+ ICA Prox  47      20              heterogenous                   +----------+--------+--------+--------+------------+------------------+ ICA Distal53      23                                             +----------+--------+--------+--------+------------+------------------+ ECA       49      10                                             +----------+--------+--------+--------+------------+------------------+ Portions of this table do not appear on this page. +----------+--------+-------+--------+------------+           PSV cm/sEDV cmsDescribeArm Pressure +----------+--------+-------+--------+------------+ [id number]                                  +----------+--------+-------+--------+------------+ +---------+--------+--+--------+--+ VertebralPSV cm/s39EDV cm/s12 +---------+--------+--+--------+--+ Left Carotid Findings: +----------+--------+--------+--------+------------+------------------+           PSV cm/sEDV  cm/sStenosisDescribe    Comments           +----------+--------+--------+--------+------------+------------------+ CCA Prox  60      16                          intimal thickening +----------+--------+--------+--------+------------+------------------+ CCA Distal47      18                          intimal thickening +----------+--------+--------+--------+------------+------------------+ ICA Prox  29      12              heterogenous                   +----------+--------+--------+--------+------------+------------------+ ICA Distal47      22                                             +----------+--------+--------+--------+------------+------------------+  ECA       36      6                                              +----------+--------+--------+--------+------------+------------------+ +---------+--------+--+--------+--+ VertebralPSV cm/s64EDV cm/s27 +---------+--------+--+--------+--+  ABI Findings: +--------+------------------+-----+-----------+--------+ Right   Rt Pressure (mmHg)IndexWaveform   Comment  +--------+------------------+-----+-----------+--------+ Brachial                       triphasic           +--------+------------------+-----+-----------+--------+ PTA                            multiphasic         +--------+------------------+-----+-----------+--------+ DP                             multiphasic         +--------+------------------+-----+-----------+--------+ +--------+------------------+-----+-----------+-------+ Left    Lt Pressure (mmHg)IndexWaveform   Comment +--------+------------------+-----+-----------+-------+ Brachial                       triphasic          +--------+------------------+-----+-----------+-------+ PTA                            multiphasic        +--------+------------------+-----+-----------+-------+ DP                             multiphasic         +--------+------------------+-----+-----------+-------+  Right Doppler Findings: +--------+--------+-----+---------+--------+ Site    PressureIndexDoppler  Comments +--------+--------+-----+---------+--------+ Brachial             triphasic         +--------+--------+-----+---------+--------+ Radial               triphasic         +--------+--------+-----+---------+--------+ Ulnar                triphasic         +--------+--------+-----+---------+--------+  Left Doppler Findings: +--------+--------+-----+---------+--------+ Site    PressureIndexDoppler  Comments +--------+--------+-----+---------+--------+ Brachial             triphasic         +--------+--------+-----+---------+--------+ Radial               triphasic         +--------+--------+-----+---------+--------+ Ulnar                triphasic         +--------+--------+-----+---------+--------+  Summary: Right Carotid: The extracranial vessels were near-normal with only minimal wall                thickening or plaque. Left Carotid: The extracranial vessels were near-normal with only minimal wall               thickening or plaque. Vertebrals:  Bilateral vertebral arteries demonstrate antegrade flow. Subclavians: Normal flow hemodynamics were seen in bilateral subclavian              arteries. Right Upper Extremity: Doppler waveform obliterate with right radial compression. Doppler waveforms remain within normal limits with right ulnar compression. Left Upper  Extremity: Doppler waveform obliterate with left radial compression. Doppler waveforms remain within normal limits with left ulnar compression.     Preliminary    Cardiac Studies   Cardiac cath 06/13/2020   Mid LM to Dist LM lesion is 85% stenosed.  Ost LAD to Prox LAD lesion is 40% stenosed.  2nd Diag lesion is 90% stenosed.  Lat 2nd Diag lesion is 80% stenosed.  Ost Cx lesion is 30% stenosed.  Prox Cx lesion is 70% stenosed.  Mid Cx to  Dist Cx lesion is 80% stenosed.  Prox RCA lesion is 70% stenosed.  RPDA-1 lesion is 70% stenosed.  RPDA-2 lesion is 95% stenosed.  2nd RPL lesion is 50% stenosed.  Mid LAD to Dist LAD lesion is 50% stenosed.  Dist RCA lesion is 50% stenosed.   Severe coronary calcification and multivessel CAD with focal 85% distal left main stenosis; diffuse 40% LAD stenosis with 50% mid stenosis and 90 and 80% stenoses in the proximal and mid diagonal vessel; 30% ostial circumflex stenosis with 70% mid stenosis and 80% stenosis in the second marginal branch; diffusely irregular dominant RCA with 70% mid stenosis, 60% stenosis proximal to the PDA takeoff with 70 and 95% proximal and distal PDA stenoses and 50% inferior LV stenosis.  Unsuccessful attempt at crossing the aortic valve.  RECOMMENDATION: Surgical consultation for CABG revascularization surgery.  We will schedule patient for 2D echo Doppler study.  Aggressive lipid-lowering therapy.  Patient has significant hypertension, increase medical therapy pending surgical revascularization.   Patient Profile     86 y.o. male with multivessel disease  Assessment & Plan    Coronary Artery Disease; Obstructive CKD IIIa Aortic Atherosclerosis - symptomatic  - Severe coronary calcification and multivessel CAD with focal 85% distal left main stenosis; diffuse 40% LAD stenosis with 50% mid stenosis and 90 and 80% stenoses in the proximal and mid diagonal vessel; 30% ostial circumflex stenosis with 70% mid stenosis and 80% stenosis in the second marginal branch; diffusely irregular dominant RCA with 70% mid stenosis, 60% stenosis proximal to the PDA takeoff with 70 and 95% proximal and distal PDA stenoses and 50% inferior LV stenosis. - continue ASA 81 mg PO Daily - continue statin - CABG was recommended, Dr Orvan Seen to see and decide, age and baseline functional status will play a role as well as heart and kidney failure - the patient is currently  asymptomatic,  - telemetry showed frequent short runs of nsVT, resolved after starting metoprolol 12.5 mg po BID yesterday - echo is pending, LVEF in 2018 60-65%, now 25-30% with apical thrombus - elevated JVDs, no crackles on lungs,   Apical LV Thrombus - started on Heparin drip yesterday  Cardiogenic shock - new low LVEF, worsening kidney failure, elevated lactic acid and LDH -I will order a central, obtain coox and start on Milrinone or Dobutamine based on results - I will discontinue metoprolol  Acute on chronic kidney failure  - Crea 1.4 -> 1.8 ->2.8 - contrast induced vs low cardiac output - I will order a central, obtain coox and start on Milrinone or Dobutamine based on results  Essential Hypertension - low since cath, off levophed > 24 H, encouraged to increase oral intake  Mild to Moderate TR Severe LVH, without LA enlargement Carpal Tunnel Surgery - Coreg 6.25 mG BID  For questions or updates, please contact [facility] Please consult [email or web address] for contact info under     Signed, Ena Dawley, MD  06/21/2020, 7:59 AM

## 2020-06-21 NOTE — Progress Notes (Addendum)
[place] for heparin  Indication: LV thrombus  No Known Allergies  Patient Measurements: Height: 5\' 9"  (175.3 cm) Weight: 73.3 kg (161 lb 9.6 oz) IBW/kg (Calculated) : 70.7 Heparin Dosing Weight: 73kg  Vital Signs: Temp: 98.1 F (36.7 C) (12/12 0731) Temp Source: Oral (12/12 0731) BP: 108/81 (12/12 1344) Pulse Rate: 75 (12/12 1344)  Labs: Recent Labs    06/29/2020 2232 06/20/20 0119 06/21/20 0104 06/21/20 1220  HGB 11.1* 11.9* 11.8*  --   HCT 31.4* 35.7* 34.4*  --   PLT 184 199 187  --   HEPARINUNFRC  --   --  0.98* 0.91*  CREATININE 1.62* 1.84* 2.79*  --     Estimated Creatinine Clearance: 19 mL/min (A) (by C-G formula based on SCr of 2.79 mg/dL (H)).   Assessment: 86 year old male presented to [facility] for diagnostic cath for ongoing angina found to have multivessel disease. Currently awaiting surgical consultation.   Echo showed low EF and LV thrombus, orders to start IV heparin. It does not appear that he was on blood thinners prior to admit.  Heparin level continues to be supratherapeutic on recheck (0.91) rate of 750 units/hr. Level appears to be drawn correctly, no bleeding noted.  Goal of Therapy:  Heparin level 0.3-0.7 units/ml Monitor platelets by anticoagulation protocol: Yes   Plan:  Decrease heparin infusion to 600 units/hr Check heparin level in 8 hours  Erin Hearing PharmD., BCPS Clinical Pharmacist 06/21/2020 1:57 PM

## 2020-06-21 NOTE — Progress Notes (Signed)
[place] for heparin  Indication: LV thrombus  No Known Allergies  Patient Measurements: Height: 5\' 9"  (175.3 cm) Weight: 73.3 kg (161 lb 9.6 oz) IBW/kg (Calculated) : 70.7 Heparin Dosing Weight: 73kg  Vital Signs: Temp: 98.3 F (36.8 C) (12/11 2300) Temp Source: Oral (12/11 2300) BP: 112/83 (12/12 0000) Pulse Rate: 82 (12/12 0000)  Labs: Recent Labs    06/17/2020 2232 06/20/20 0119 06/21/20 0104  HGB 11.1* 11.9* 11.8*  HCT 31.4* 35.7* 34.4*  PLT 184 199 187  HEPARINUNFRC  --   --  0.98*  CREATININE 1.62* 1.84* 2.79*    Estimated Creatinine Clearance: 19 mL/min (A) (by C-G formula based on SCr of 2.79 mg/dL (H)).   Assessment: 86 year old male presented to [facility] for diagnostic cath for ongoing angina found to have multivessel disease. Currently awaiting surgical consultation.   Echo showed low EF and LV thrombus, orders to start IV heparin. It does not appear that he was on blood thinners prior to admit.  Heparin level supratherapeutic (0.98) on gtt at 900 units/hr. Level appears to be drawn correctly, no bleeding noted.  Goal of Therapy:  Heparin level 0.3-0.7 units/ml Monitor platelets by anticoagulation protocol: Yes   Plan:  Decrease heparin infusion to 750 units/hr Check heparin level in 8 hours  Sherlon Handing, PharmD, BCPS Please see amion for complete clinical pharmacist phone list 06/21/2020 3:30 AM

## 2020-06-21 NOTE — Progress Notes (Signed)
Pre-procedure vital signs.

## 2020-06-21 NOTE — Progress Notes (Signed)
[facility] contacted by pt's nurse; she stated physician had ordered a temporary central line and asked if [facility] was responsible for placing those lines. Advised that [facility] places PICC lines and this patient has a CrCl <30, so a PICC is not recommended. Paged Dr. Meda Coffee. Also sent a SecureChat to Dr. Meda Coffee for clarification that she will be coming to place central line.  Awaiting response.

## 2020-06-21 NOTE — Consult Note (Signed)
NAME:  Isaiah Davidson, MRN:  [id number], DOB:  [birth year], LOS: 2 ADMISSION DATE:  07/02/2020, CONSULTATION DATE:  06/21/2020 REFERRING MD:  Orvan Seen, CHIEF COMPLAINT: Preoperative risk assessment and  prehabilitation prior to possible CABG  HPI/course in hospital  86 year old man who presented to his outpatient cardiologist with increasing anginal discomfort with associated dyspnea, CCS class III.  Found to have multivessel coronary disease amenable to surgical revascularization. Echocardiogram shows decreased ejection fraction at 25%, left ventricular hypertrophy and possible aortic stenosis with significant calcification of the aortic valve (low flow stenosis)  The patient lives independently.  Past medical history is significant for absence type seizures with his last seizure being 6 years ago and associated mild cognitive decline since then.  There has been no improvement despite control of seizures in the next addition of Aricept.  He finds he has issues with short-term memory but otherwise can still navigate tasks such as driving.  Last Mini-Mental status examination was 20/30 within the last year.  He has a remote smoking history, there are no occupational lung exposures.  There is no history of underlying lung disease  He has always been of slender build and his appetite has worsened over the last 2 years with significant decline over the last 2 months.  Past Medical History   Past Medical History:  Diagnosis Date  . Heart murmur   . HYPERLIPIDEMIA 02/22/2007  . HYPERTENSION 02/22/2007  . MCI (mild cognitive impairment) with memory loss 05/14/2014  . Seizures ([place])    treated for siezures around 2010     Past Surgical History:  Procedure Laterality Date  . CARPAL TUNNEL RELEASE Bilateral   . CHOLECYSTECTOMY N/A 06/15/2018   Procedure: LAPAROSCOPIC CHOLECYSTECTOMY;  Surgeon: Kinsinger, Arta Bruce, MD;  Location: [place];  Service: General;  Laterality: N/A;  . KNEE ARTHROSCOPY  Right      Review of Systems:   Review of Systems  Constitutional: Positive for weight loss.  HENT: Negative.   Eyes: Negative.   Respiratory: Positive for shortness of breath. Negative for wheezing.   Cardiovascular: Positive for chest pain. Negative for claudication and leg swelling.  Gastrointestinal: Negative.   Genitourinary: Negative.   Musculoskeletal: Negative.   Skin: Negative.   Neurological: Positive for seizures.  Endo/Heme/Allergies: Negative.   Psychiatric/Behavioral: Positive for memory loss.    Social History   reports that he quit smoking about 41 years ago. His smoking use included cigars and cigarettes. He has a 30.00 pack-year smoking history. He has never used smokeless tobacco. He reports previous alcohol use. He reports current drug use. Drug: Benzodiazepines.   Family History   His family history includes Diabetes in his paternal grandmother.   Allergies No Known Allergies   Home Medications  Prior to Admission medications   Medication Sig Start Date End Date Taking? Authorizing Provider  allopurinol (ZYLOPRIM) 100 MG tablet Take 1 tablet (100 mg total) by mouth daily. 10/18/19  Yes Marin Olp, MD  amLODipine (NORVASC) 10 MG tablet Take 1 tablet (10 mg total) by mouth daily. 10/18/19  Yes Marin Olp, MD  aspirin EC 81 MG tablet Take 1 tablet (81 mg total) by mouth daily. Swallow whole. 06/16/20  Yes Chandrasekhar, Mahesh A, MD  carvedilol (COREG) 6.25 MG tablet Take 1 tablet (6.25 mg total) by mouth 2 (two) times daily. 05/26/20  Yes Chandrasekhar, Mahesh A, MD  chlorthalidone (HYGROTON) 25 MG tablet Take 1 tablet (25 mg total) by mouth daily. 10/18/19  Yes Marin Olp,  MD  donepezil (ARICEPT) 10 MG tablet Take 1 tablet (10 mg total) by mouth at bedtime. 06/08/20  Yes Ward Givens, NP  levETIRAcetam (KEPPRA) 500 MG tablet Take 1 tablet (500 mg total) by mouth 2 (two) times daily. 06/08/20  Yes Ward Givens, NP  rosuvastatin (CRESTOR) 20  MG tablet Take 1 tablet (20 mg total) by mouth daily. 11/20/19  Yes Marin Olp, MD  nitroGLYCERIN (NITROSTAT) 0.4 MG SL tablet Place 1 tablet (0.4 mg total) under the tongue every 5 (five) minutes as needed for chest pain. 06/16/20 09/14/20  Werner Lean, MD     Interim history/subjective:  Currently chest pain-free.  Objective   Blood pressure 113/81, pulse 76, temperature 98.1 F (36.7 C), temperature source Oral, resp. rate 20, height 5\' 9"  (1.753 m), weight 73.3 kg, SpO2 97 %.        Intake/Output Summary (Last 24 hours) at 06/21/2020 1347 Last data filed at 06/21/2020 0745 Gross per 24 hour  Intake 1057.94 ml  Output 300 ml  Net 757.94 ml   Filed Weights   06/22/2020 0958  Weight: 73.3 kg    Examination: Physical Exam Constitutional:      Appearance: Normal appearance. He is underweight.     Comments: Mild muscle wasting  Eyes:     Conjunctiva/sclera: Conjunctivae normal.  Neck:     Thyroid: No thyromegaly.     Vascular: No carotid bruit or JVD.  Cardiovascular:     Rate and Rhythm: Normal rate and regular rhythm.     Pulses: Normal pulses.     Heart sounds: S1 normal and S2 normal.  Pulmonary:     Effort: Pulmonary effort is normal.     Breath sounds: Normal breath sounds.  Abdominal:     Palpations: Abdomen is soft.  Musculoskeletal:     Right lower leg: No edema.     Left lower leg: No edema.  Neurological:     Mental Status: He is alert.     GCS: GCS eye subscore is 4. GCS verbal subscore is 5. GCS motor subscore is 6.     Motor: Motor function is intact.      Ancillary tests (personally reviewed)  CBC: Recent Labs  Lab 06/17/2020 1755 06/20/2020 2232 06/20/20 0119 06/21/20 0104  WBC 4.5 6.2 10.1 6.2  HGB 12.1* 11.1* 11.9* 11.8*  HCT 33.7* 31.4* 35.7* 34.4*  MCV 88.2 89.7 92.0 90.8  PLT 187 184 199 921    Basic Metabolic Panel: Recent Labs  Lab 06/16/20 1052 06/18/2020 1755 06/29/2020 2232 06/20/20 0119 06/20/20 0612  06/21/20 0104  NA 136  --  136 138  --  136  K 4.5  --  3.6 4.5  --  5.2*  CL 99  --  103 104  --  102  CO2 24  --  20* 20*  --  19*  GLUCOSE 84  --  118* 145*  --  124*  BUN 12  --  15 16  --  40*  CREATININE 1.42* 1.22 1.62* 1.84*  --  2.79*  CALCIUM 9.5  --  8.5* 9.1  --  8.9  MG  --   --   --   --  2.0  --    GFR: Estimated Creatinine Clearance: 19 mL/min (A) (by C-G formula based on SCr of 2.79 mg/dL (H)). Recent Labs  Lab 06/23/2020 1755 06/16/2020 2232 06/20/20 0119 06/21/20 0104  WBC 4.5 6.2 10.1 6.2  LATICACIDVEN  --  3.3* 4.5*  --  Liver Function Tests: No results for input(s): AST, ALT, ALKPHOS, BILITOT, PROT, ALBUMIN in the last 168 hours. No results for input(s): LIPASE, AMYLASE in the last 168 hours. No results for input(s): AMMONIA in the last 168 hours.  ABG    Component Value Date/Time   TCO2 22 06/14/2018 1812     Coagulation Profile: No results for input(s): INR, PROTIME in the last 168 hours.  Cardiac Enzymes: No results for input(s): CKTOTAL, CKMB, CKMBINDEX, TROPONINI in the last 168 hours.  HbA1C: Hgb A1c MFr Bld  Date/Time Value Ref Range Status  02/23/2009 12:20 PM  4.6 - 6.1 % Final   5.6 (NOTE) The ADA recommends the following therapeutic goal for glycemic control related to Hgb A1c measurement: Goal of therapy: <6.5 Hgb A1c  Reference: American Diabetes Association: Clinical Practice Recommendations 2010, Diabetes Care, 2010, 33: (Suppl  1).    CBG: No results for input(s): GLUCAP in the last 168 hours.   Assessment & Plan:   Crescendo angina due to multivessel coronary disease Ischemic cardiomyopathy Possible component of aortic stenosis Mild cognitive impairment Seizure disorder, absence type, well controlled Moderate nutritional risk Generalized frailty AKI due to contrast nephropathy versus low flow state.  Plan:  Patient would be very high risk for cardiac surgery particularly if renal function does not improve or if  he needs combined CABG plus aortic valve replacement. Risk of further cognitive deterioration is also a significant possibility as would be the risk of postoperative hemodialysis. High nutritional risk compounds surgical risk   -Have inserted central line and will check SCV O2 and initiate inotropes in an attempt to improve cardiorenal component of AKI -Have ordered baseline PT functional assessment -Ordered baseline OT cognitive assessment -Started oral dietary supplementation, dietitian to see for calorie count, may need initiation of enteral feeds to optimize nutritional status prior to surgery -Order baseline PFTs.  -If patient is in a low flow state and cardiac performance and perfusion improves with dobutamine 1 might consider repeating his echocardiogram to reassess the severity of the aortic stenosis.    Kipp Brood, MD [facility] ICU Physician [place]  Pager: [phone] Or Epic Secure Chat After hours: [phone].  06/21/2020, 1:47 PM

## 2020-06-21 NOTE — Procedures (Signed)
Central Venous Catheter Insertion Procedure Note  Isaiah Davidson  [id number]  1931-12-02  Date:06/21/20  Time:2:42 PM   Provider Performing:Nathin Saran   Procedure: Insertion of Non-tunneled Central Venous [phone]) with US guidance ([id number])   Indication(s) Medication administration  Consent Risks of the procedure as well as the alternatives and risks of each were explained to the patient and/or caregiver.  Consent for the procedure was obtained and is signed in the bedside chart  Anesthesia topical lidocaine, fentanyl 73mcg  Timeout Verified patient identification, verified procedure, site/side was marked, verified correct patient position, special equipment/implants available, medications/allergies/relevant history reviewed, required imaging and test results available.  Sterile Technique Maximal sterile technique including full sterile barrier drape, hand hygiene, sterile gown, sterile gloves, mask, hair covering, sterile ultrasound probe cover (if used).  Procedure Description Area of catheter insertion was cleaned with chlorhexidine and draped in sterile fashion.  With real-time ultrasound guidance a central venous catheter was placed into the left subclavian vein. Nonpulsatile blood flow and easy flushing noted in all ports.  The catheter was sutured in place and sterile dressing applied.  19F triple lumen catheter inserted to 20cm. Single pass.     Complications/Tolerance None; patient tolerated the procedure well. Chest X-ray is ordered to verify placement for internal jugular or subclavian cannulation.   Chest x-ray is not ordered for femoral cannulation.  EBL Minimal  Kipp Brood, MD [facility] ICU Physician [place]  Pager: [phone] Or Epic Secure Chat After hours: [phone].  06/21/2020, 2:44 PM

## 2020-06-21 NOTE — Progress Notes (Signed)
Assisted Dr. Lynetta Mare with bedside central line placement.  Consent, bedside procedure, AC notification, vital signs, and time out performed.  Patient did well during procedure and is resting comfortably now.  Post procedure vitals BP 103/77 Pulse 73 Resp 21 O2 98% on 2L Isaiah Davidson

## 2020-06-21 NOTE — Progress Notes (Signed)
Received to bed 2C-08, transfer from 2-Heart. Report was received from [place] , [place].  All monitors placed. VS WNL. 2L [place]. Heparin gtt @ 900 units/hour infusing in LFA PIV. NS @ 100 cc/hr.  Denies dizziness, nausea, pain, shortness of breath.  Alert to person/place/situation. Confused to date.  Assessment performed. Call light in reach. Bed alarm engaged. Continuing to monitor.

## 2020-06-22 ENCOUNTER — Other Ambulatory Visit (HOSPITAL_COMMUNITY): Payer: Medicare HMO

## 2020-06-22 ENCOUNTER — Inpatient Hospital Stay (HOSPITAL_COMMUNITY): Payer: Medicare HMO

## 2020-06-22 ENCOUNTER — Encounter (HOSPITAL_COMMUNITY): Payer: Self-pay | Admitting: Cardiovascular Disease

## 2020-06-22 DIAGNOSIS — N179 Acute kidney failure, unspecified: Secondary | ICD-10-CM

## 2020-06-22 DIAGNOSIS — N183 Chronic kidney disease, stage 3 unspecified: Secondary | ICD-10-CM

## 2020-06-22 DIAGNOSIS — I513 Intracardiac thrombosis, not elsewhere classified: Secondary | ICD-10-CM

## 2020-06-22 DIAGNOSIS — I350 Nonrheumatic aortic (valve) stenosis: Secondary | ICD-10-CM

## 2020-06-22 DIAGNOSIS — I5021 Acute systolic (congestive) heart failure: Secondary | ICD-10-CM

## 2020-06-22 DIAGNOSIS — E43 Unspecified severe protein-calorie malnutrition: Secondary | ICD-10-CM | POA: Insufficient documentation

## 2020-06-22 LAB — PULMONARY FUNCTION TEST
FEF 25-75 Pre: 0.33 L/sec
FEF2575-%Pred-Pre: 19 %
FEV1-%Pred-Pre: 40 %
FEV1-Pre: 0.98 L
FEV1FVC-%Pred-Pre: 81 %
FEV6-%Pred-Pre: 47 %
FEV6-Pre: 1.51 L
FEV6FVC-%Pred-Pre: 98 %
FVC-%Pred-Pre: 48 %
FVC-Pre: 1.64 L
Pre FEV1/FVC ratio: 60 %
Pre FEV6/FVC Ratio: 92 %

## 2020-06-22 LAB — HEPARIN LEVEL (UNFRACTIONATED)
Heparin Unfractionated: 0.33 IU/mL (ref 0.30–0.70)
Heparin Unfractionated: 0.17 IU/mL — ABNORMAL LOW (ref 0.30–0.70)
Heparin Unfractionated: 0.14 IU/mL — ABNORMAL LOW (ref 0.30–0.70)

## 2020-06-22 LAB — CBC
HCT: 29.2 % — ABNORMAL LOW (ref 39.0–52.0)
HCT: 29.4 % — ABNORMAL LOW (ref 39.0–52.0)
Hemoglobin: 9.8 g/dL — ABNORMAL LOW (ref 13.0–17.0)
Hemoglobin: 10.3 g/dL — ABNORMAL LOW (ref 13.0–17.0)
MCH: 31 pg (ref 26.0–34.0)
MCH: 32.3 pg (ref 26.0–34.0)
MCHC: 33.6 g/dL (ref 30.0–36.0)
MCHC: 35 g/dL (ref 30.0–36.0)
MCV: 92.4 fL (ref 80.0–100.0)
MCV: 92.2 fL (ref 80.0–100.0)
Platelets: 156 10*3/uL (ref 150–400)
Platelets: 152 10*3/uL (ref 150–400)
RBC: 3.16 MIL/uL — ABNORMAL LOW (ref 4.22–5.81)
RBC: 3.19 MIL/uL — ABNORMAL LOW (ref 4.22–5.81)
RDW: 13.3 % (ref 11.5–15.5)
RDW: 13.3 % (ref 11.5–15.5)
WBC: 8.3 10*3/uL (ref 4.0–10.5)
WBC: 8.1 10*3/uL (ref 4.0–10.5)
nRBC: 0 % (ref 0.0–0.2)
nRBC: 0 % (ref 0.0–0.2)

## 2020-06-22 LAB — LACTIC ACID, PLASMA
Lactic Acid, Venous: 2.5 mmol/L (ref 0.5–1.9)
Lactic Acid, Venous: 2.2 mmol/L (ref 0.5–1.9)

## 2020-06-22 LAB — BASIC METABOLIC PANEL
Anion gap: 10 (ref 5–15)
BUN: 41 mg/dL — ABNORMAL HIGH (ref 8–23)
CO2: 22 mmol/L (ref 22–32)
Calcium: 8.1 mg/dL — ABNORMAL LOW (ref 8.9–10.3)
Chloride: 103 mmol/L (ref 98–111)
Creatinine, Ser: 1.97 mg/dL — ABNORMAL HIGH (ref 0.61–1.24)
GFR, Estimated: 32 mL/min — ABNORMAL LOW (ref >60)
Glucose, Bld: 114 mg/dL — ABNORMAL HIGH (ref 70–99)
Potassium: 4 mmol/L (ref 3.5–5.1)
Sodium: 135 mmol/L (ref 135–145)

## 2020-06-22 LAB — COOXEMETRY PANEL
Carboxyhemoglobin: 0.9 % (ref 0.5–1.5)
Carboxyhemoglobin: 0.7 % (ref 0.5–1.5)
Methemoglobin: 0.9 % (ref 0.0–1.5)
Methemoglobin: 0.8 % (ref 0.0–1.5)
O2 Saturation: 93.8 %
O2 Saturation: 70.9 %
Total hemoglobin: 10.2 g/dL — ABNORMAL LOW (ref 12.0–16.0)
Total hemoglobin: 13.3 g/dL (ref 12.0–16.0)

## 2020-06-22 MED ORDER — DOBUTAMINE IN D5W 4-5 MG/ML-% IV SOLN
5.0000 ug/kg/min | INTRAVENOUS | Status: DC
  Administered 2020-06-25: 5 ug/kg/min via INTRAVENOUS
  Filled 2020-06-22: qty 250

## 2020-06-22 MED ORDER — PROSOURCE PLUS PO LIQD
30.0000 mL | Freq: Two times a day (BID) | ORAL | Status: DC
  Administered 2020-06-23 (×2): 30 mL via ORAL
  Filled 2020-06-22 (×2): qty 30

## 2020-06-22 MED ORDER — ENSURE ENLIVE PO LIQD
237.0000 mL | Freq: Four times a day (QID) | ORAL | Status: DC
  Administered 2020-06-22 – 2020-06-23 (×5): 237 mL via ORAL

## 2020-06-22 MED ORDER — PERFLUTREN LIPID MICROSPHERE
1.0000 mL | INTRAVENOUS | Status: AC | PRN
  Administered 2020-06-22: 17:00:00 4 mL via INTRAVENOUS
  Filled 2020-06-22: qty 10

## 2020-06-22 NOTE — Evaluation (Signed)
Physical Therapy Evaluation Patient Details Name: Isaiah Davidson MRN: [id number] DOB: May 31, 1932 Today's Date: 06/22/2020   History of Present Illness  86 y.o. male with a hx of HTN, HLD, Aortic Atherosclerosis, Carpal Tunnel Syndrome, Mild Cognitive Impairment on Donepezil, seizures and memory disturbances. Presented 12/10 to Cardiac Cath Lab for Left heart catheterization and coronary angiography.  After which he became hypotenside, diaphoretic and with c/o of 10/10 chest/ABD pain. Found to be in cardiogenic shock with new low LVEF, worsening kidney failure and elevated lactic acied and LDH. Non-tunneled central venous catheter placed in L subclavian vein 12/12.  Clinical Impression  PTA pt living with wife in single story home with level entry. Pt reports independence with limited community level mobility without AD, and independent with ADLs and iADLs. Pt is limited in safe mobility by syncope with mobility. Pt is supervision with bed mobility, min guard for transfers and min A for ambulation without AD. Pt with syncopal episode after approximately 70 feet of ambulation. Able to support pt on PT knee with assist of gait belt until he could be transferred to chair. Pt quickly responsive with BP and HR in line with levels prior to ambulation. Once medically stable, PT recommend d/c home with HHPT. PT will continue to follow acutely.     Follow Up Recommendations Home health PT;Supervision/Assistance - 24 hour    Equipment Recommendations  Rolling walker with 5" wheels       Precautions / Restrictions Precautions Precautions: Fall Precaution Comments: syncopal with ambulation Restrictions Weight Bearing Restrictions: No      Mobility  Bed Mobility Overal bed mobility: Needs Assistance Bed Mobility: Supine to Sit     Supine to sit: Supervision     General bed mobility comments: supervision for safety and management of lines    Transfers Overall transfer level: Needs  assistance Equipment used: None Transfers: Sit to/from Stand Sit to Stand: Min guard         General transfer comment: min guard for safety, good power up and self steadying without AD  Ambulation/Gait Ambulation/Gait assistance: Min guard;Min assist Gait Distance (Feet): 70 Feet Assistive device: None Gait Pattern/deviations: Step-through pattern;Decreased stride length Gait velocity: slowed Gait velocity interpretation: <1.31 ft/sec, indicative of household ambulator General Gait Details: minA progressing to min guard for safety with steady gait, after 45 feet pt request to turn around for slight dizziness with decreasing steadiness for gait, moved to side of hallway to utilize handrail when pt became syncopal, able to stabilize pt until assist for moving to chair to roll back to room      Balance Overall balance assessment: Needs assistance Sitting-balance support: No upper extremity supported Sitting balance-Leahy Scale: Fair     Standing balance support: No upper extremity supported Standing balance-Leahy Scale: Fair                               Pertinent Vitals/Pain Pain Assessment: No/denies pain    Home Living Family/patient expects to be discharged to:: Private residence Living Arrangements: Spouse/significant other Available Help at Discharge: Family;Available 24 hours/day Type of Home: House Home Access: Stairs to enter     Home Layout: One level Home Equipment: None;Cane - single point      Prior Function Level of Independence: Independent         Comments: able to walk limited community distances, sometimes with increased fatigue        Extremity/Trunk Assessment  Upper Extremity Assessment Upper Extremity Assessment: Defer to OT evaluation;Generalized weakness    Lower Extremity Assessment Lower Extremity Assessment: Generalized weakness       Communication   Communication: No difficulties  Cognition Arousal/Alertness:  Awake/alert Behavior During Therapy: WFL for tasks assessed/performed Overall Cognitive Status: Within Functional Limits for tasks assessed                                        General Comments General comments (skin integrity, edema, etc.): pt on 2L O2 via [place] with SaO2 98%O2, pt reports no use of supplemental O2 at home, removed [place] and pt 96%O2, prior to ambulation 120/80, HR 92 pm, with ambulation SaO2 >90%O2, unable to visualize HR when pt became syncopal and lead loss with transport back to bed, with sitting in chair pt opens eyes and states he feels okay requires assist back to bed, BP with supine 117/82, HR 98bpm        Assessment/Plan    PT Assessment Patient needs continued PT services  PT Problem List Decreased mobility;Decreased activity tolerance;Cardiopulmonary status limiting activity       PT Treatment Interventions DME instruction;Gait training;Functional mobility training;Therapeutic activities;Therapeutic exercise;Balance training;Cognitive remediation;Patient/family education    PT Goals (Current goals can be found in the Care Plan section)  Acute Rehab PT Goals Patient Stated Goal: go home to be with wife PT Goal Formulation: With patient Time For Goal Achievement: 07/06/20 Potential to Achieve Goals: Fair    Frequency Min 3X/week    AM-PAC PT "6 Clicks" Mobility  Outcome Measure Help needed turning from your back to your side while in a flat bed without using bedrails?: None Help needed moving from lying on your back to sitting on the side of a flat bed without using bedrails?: None Help needed moving to and from a bed to a chair (including a wheelchair)?: A Little Help needed standing up from a chair using your arms (e.g., wheelchair or bedside chair)?: A Little Help needed to walk in hospital room?: A Little Help needed climbing 3-5 steps with a railing? : A Little 6 Click Score: 20    End of Session Equipment Utilized During  Treatment: Gait belt Activity Tolerance: Treatment limited secondary to medical complications (Comment) (sycopal episode with ambulation) Patient left: in bed;with nursing/sitter in room Nurse Communication: Mobility status PT Visit Diagnosis: Other abnormalities of gait and mobility (R26.89);Muscle weakness (generalized) (M62.81);Difficulty in walking, not elsewhere classified (R26.2);Dizziness and giddiness (R42)    Time: 1610-9604 PT Time Calculation (min) (ACUTE ONLY): 27 min   Charges:   PT Evaluation $PT Eval Moderate Complexity: 1 Mod PT Treatments $Therapeutic Exercise: 8-22 mins        Lindy Garczynski B. Migdalia Dk PT, DPT Acute Rehabilitation Services Pager [phone] Office [phone]   [place] 06/22/2020, 10:39 AM

## 2020-06-22 NOTE — Progress Notes (Signed)
ANTICOAGULATION CONSULT NOTE  Pharmacy Consult for Heparin  Indication: LV thrombus  No Known Allergies  Patient Measurements: Height: 5\' 9"  (175.3 cm) Weight: 71.5 kg (157 lb 10.1 oz) IBW/kg (Calculated) : 70.7 Heparin Dosing Weight: 73 kg  Vital Signs: Temp: 98.9 F (37.2 C) (12/13 1600) Temp Source: Oral (12/13 1600) BP: 106/70 (12/13 1600) Pulse Rate: 96 (12/13 1600)  Labs: Recent Labs    06/20/20 0119 06/21/20 0104 06/21/20 1220 06/21/20 2300 06/22/20 0426 06/22/20 0732 06/22/20 1005 06/22/20 1807  HGB 11.9* 11.8*  --   --  9.8*  --  10.3*  --   HCT 35.7* 34.4*  --   --  29.2*  --  29.4*  --   PLT 199 187  --   --  156  --  152  --   HEPARINUNFRC  --  0.98*   < > 0.33  --  0.17*  --  0.14*  CREATININE 1.84* 2.79*  --   --  1.97*  --   --   --    < > = values in this interval not displayed.    Estimated Creatinine Clearance: 26.9 mL/min (A) (by C-G formula based on SCr of 1.97 mg/dL (H)).   Assessment: 85 year old male presented to [facility] for diagnostic cath for ongoing angina and was found to have multivessel disease; awaiting surgical consultation.   Echo showed low EF and LV thrombus; pharmacy was consulted to start IV heparin. It does not appear that he was on anticoagulant prior to admit.  Heparin level was low this AM (0.17 units/ml). RN says she paused the infusion for a few seconds to draw the heparin level, but otherwise infusion was not interrupted; no overt bleeding or complications noted.  Heparin level ~8 hrs after heparin infusion was increased to 700 units/hr was 0.14 units/ml, which remains below the goal range for this pt. H/H, platelets stable. Per RN, no issues with IV or bleeding observed.  Goal of Therapy:  Heparin level 0.3-0.7 units/ml Monitor platelets by anticoagulation protocol: Yes   Plan:  Increase IV heparin to 900 units/hr Recheck heparin level in 8 hrs Monitor daily heparin level and CBC Monitor for bleeding  complications  Gillermina Hu, PharmD, BCPS, [facility] Clinical Pharmacist  06/22/2020 7:35 PM

## 2020-06-22 NOTE — Progress Notes (Signed)
  Echocardiogram 2D Echocardiogram has been performed.  Isaiah Davidson 06/22/2020, 5:53 PM

## 2020-06-22 NOTE — Progress Notes (Signed)
ANTICOAGULATION CONSULT NOTE  Pharmacy Consult for heparin  Indication: LV thrombus  No Known Allergies  Patient Measurements: Height: 5\' 9"  (175.3 cm) Weight: 71.5 kg (157 lb 10.1 oz) IBW/kg (Calculated) : 70.7 Heparin Dosing Weight: 73kg  Vital Signs: Temp: 97.3 F (36.3 C) (12/13 0800) Temp Source: Oral (12/13 0800) BP: 120/80 (12/13 0800) Pulse Rate: 79 (12/13 0800)  Labs: Recent Labs    06/20/20 0119 06/20/20 0119 06/21/20 0104 06/21/20 1220 06/21/20 2300 06/22/20 0426 06/22/20 0732  HGB 11.9*  --  11.8*  --   --  9.8*  --   HCT 35.7*  --  34.4*  --   --  29.2*  --   PLT 199  --  187  --   --  156  --   HEPARINUNFRC  --    < > 0.98* 0.91* 0.33  --  0.17*  CREATININE 1.84*  --  2.79*  --   --  1.97*  --    < > = values in this interval not displayed.    Estimated Creatinine Clearance: 26.9 mL/min (A) (by C-G formula based on SCr of 1.97 mg/dL (H)).   Assessment: 86 year old male presented to [facility] for diagnostic cath for ongoing angina found to have multivessel disease. Currently awaiting surgical consultation.   Echo showed low EF and LV thrombus, orders to start IV heparin. It does not appear that he was on blood thinners prior to admit.  Heparin level low this AM (0.17).  RN says she paused the infusion for a few seconds to draw the heparin level, but otherwise infusion not interrupted.  No overt bleeding or complications noted.  Goal of Therapy:  Heparin level 0.3-0.7 units/ml Monitor platelets by anticoagulation protocol: Yes   Plan:  Increase IV heparin to 700 units/hr. Recheck heparin level in 8 hrs. Daily heparin level and CBC Monitor for bleeding complications  Thanks for allowing pharmacy to be a part of this patient's care.  [place] Crane, Roylene Reason, [facility] Clinical Pharmacist  06/22/2020 9:44 AM   [facility] pharmacy phone numbers are listed on [email or web address]

## 2020-06-22 NOTE — Progress Notes (Signed)
ANTICOAGULATION CONSULT NOTE  Pharmacy Consult for heparin  Indication: LV thrombus  No Known Allergies  Patient Measurements: Height: 5\' 9"  (175.3 cm) Weight: 73.3 kg (161 lb 9.6 oz) IBW/kg (Calculated) : 70.7 Heparin Dosing Weight: 73kg  Vital Signs: Temp: 98.9 F (37.2 C) (12/13 0000) Temp Source: Oral (12/13 0000) BP: 127/85 (12/13 0000) Pulse Rate: 80 (12/13 0000)  Labs: Recent Labs    06/21/2020 2232 06/20/20 0119 06/21/20 0104 06/21/20 1220 06/21/20 2300  HGB 11.1* 11.9* 11.8*  --   --   HCT 31.4* 35.7* 34.4*  --   --   PLT 184 199 187  --   --   HEPARINUNFRC  --   --  0.98* 0.91* 0.33  CREATININE 1.62* 1.84* 2.79*  --   --     Estimated Creatinine Clearance: 19 mL/min (A) (by C-G formula based on SCr of 2.79 mg/dL (H)).   Assessment: 86 year old male presented to [facility] for diagnostic cath for ongoing angina found to have multivessel disease. Currently awaiting surgical consultation.   Echo showed low EF and LV thrombus, orders to start IV heparin. It does not appear that he was on blood thinners prior to admit.  Heparin level 0.33 units/ml  Goal of Therapy:  Heparin level 0.3-0.7 units/ml Monitor platelets by anticoagulation protocol: Yes   Plan:  Continue heparin infusion at 600 units/hr Daily heparin level and CBC Monitor for bleeding complications  Thanks for allowing pharmacy to be a part of this patient's care.  Excell Seltzer, PharmD Clinical Pharmacist 06/22/2020 12:26 AM

## 2020-06-22 NOTE — Progress Notes (Signed)
OT Cancellation Note  Patient Details Name: Oaklen Thiam MRN: [id number] DOB: 06/22/1932   Cancelled Treatment:    Reason Eval/Treat Not Completed: Medical issues which prohibited therapy (Pt with syncopal episode earlier this morning with PT.)Will follow.  Malka So 06/22/2020, 11:04 AM  Nestor Lewandowsky, OTR/L Acute Rehabilitation Services Pager: [phone] Office: [phone]

## 2020-06-22 NOTE — Consult Note (Addendum)
Advanced Heart Failure Team Consult Note   Primary Physician: Marin Olp, MD PCP-Cardiologist:  Dr. Gasper Sells  Reason for Consultation: Acute Biventricular Heart Failure>>Cardiogenic Shock; HF optimization prior to potential CABG   HPI:    Isaiah Davidson is seen today for evaluation of acute biventricular heart failure>>cardiogenic shock/ HF optimization prior to potential CABG, at the request of Dr. Orvan Seen, [facility].   86 y/o AAM w/ PMH that includes HTN, HLD, seizures and bilateral carpal tunnel s/p bilateral carpal tunnel release.   Had echo in 2018 showing normal LVEF 60-65% w/ severe LVH. RV normal.   Recently underwent w/u for CP and had abnormal coronary CTA showing multivessel CAD w/ markedly elevated coronary calcium score of 3661.   Diagnostic LHC 12/10 showed severe multivessel CAD w/ LM involvement, focal 85% distal left main stenosis; diffuse 40% LAD stenosis with 50% mid stenosis and 90 and 80% stenoses in the proximal and mid diagonal vessel; 30% ostial circumflex stenosis with 70% mid stenosis and 80% stenosis in the second marginal branch; diffusely irregular dominant RCA with 70% mid stenosis, 60% stenosis proximal to the PDA takeoff with 70 and 95% proximal and distal PDA stenoses and 50% inferior LV stenosis.  Echo shows reduced LVEF now 20-25% w/ diffuse hypokinesis, severe LVH and  moderately reduced RV. AV severely calcified (tricuspid). Because left ventricular function and stroke volume are significantly reduced, cannot exclude low flow low gradient aortic stenosis; significant valvular calcification. AV could not be crossed during catheterization.   He was also noted to have LV thrombus on echo and was placed on IV heparin.   He is now being considered for potential CABG.   He has developed cardiogenic shock w/ AKI. SCr bumped from baseline of 1.4 to 2.79. Lactic acid up to 4.5. Central line placed 12/12 and found to have low co-ox 42%.  Started on DBA 2.5. F/u Co-ox today resulted at 94% (doubt accurate, suspect air bubble. Repeat pending).  Scr trending down, 2.79>>1.97 today.    He is currently CP free. No resting dyspnea. BP stable 185U systolic. NSR on tele, HR 80s. CVP 6-7. Resting comfortably in bed.   Prior to this admit, he was fairly active for 88. Lives at home w/ wife. Independent w/ ADLs and works in yard often. Only limitation recently has been exertional dyspnea/angina.    LHC 06/22/20   Mid LM to Dist LM lesion is 85% stenosed.  Ost LAD to Prox LAD lesion is 40% stenosed.  2nd Diag lesion is 90% stenosed.  Lat 2nd Diag lesion is 80% stenosed.  Ost Cx lesion is 30% stenosed.  Prox Cx lesion is 70% stenosed.  Mid Cx to Dist Cx lesion is 80% stenosed.  Prox RCA lesion is 70% stenosed.  RPDA-1 lesion is 70% stenosed.  RPDA-2 lesion is 95% stenosed.  2nd RPL lesion is 50% stenosed.  Mid LAD to Dist LAD lesion is 50% stenosed.  Dist RCA lesion is 50% stenosed.   Severe coronary calcification and multivessel CAD with focal 85% distal left main stenosis; diffuse 40% LAD stenosis with 50% mid stenosis and 90 and 80% stenoses in the proximal and mid diagonal vessel; 30% ostial circumflex stenosis with 70% mid stenosis and 80% stenosis in the second marginal branch; diffusely irregular dominant RCA with 70% mid stenosis, 60% stenosis proximal to the PDA takeoff with 70 and 95% proximal and distal PDA stenoses and 50% inferior LV stenosis.  Unsuccessful attempt at crossing the aortic valve.  Echo 06/20/20 1. Left ventricular ejection fraction, by estimation, is 25 to 30%. The left ventricle has severely decreased function.There is global hypokinesis with apical wall motion abnormalities as specified. There is severe left ventricular hypertrophy. Left ventricular diastolic parameters are consistent with Grade II diastolic dysfunction (pseudonormalization). A left ventricular thrombus is  noted. 2. The aortic valve is tricuspid. There is severe calcifcation of the aortic valve. Aortic valve regurgitation is not visualized. Aortic valve area, by VTI measures 0.98 cm. Because left ventricular function and stroke volume are significantly reduced, cannot exclude low flow low gradient aortic stenosis; significant valvular calcification. 3. Right ventricular systolic function is moderately reduced. The right ventricular size is normal. Mildly increased right ventricular wall thickness. There is mildly elevated pulmonary artery systolic pressure. 4. The mitral valve is grossly normal. Moderate mitral valve regurgitation. 5. Tricuspid valve regurgitation is mild to moderate. 6. The inferior vena cava is dilated in size with <50% respiratory variability, suggesting right atrial pressure of 15 mmHg.   Review of Systems: [y] = yes, '[ ]'  = no   . General: Weight gain '[ ]' ; Weight loss '[ ]' ; Anorexia '[ ]' ; Fatigue '[ ]' ; Fever '[ ]' ; Chills '[ ]' ; Weakness '[ ]'   . Cardiac: Chest pain/pressure [ Y]; Resting SOB '[ ]' ; Exertional SOB [ Y]; Orthopnea '[ ]' ; Pedal Edema '[ ]' ; Palpitations '[ ]' ; Syncope '[ ]' ; Presyncope '[ ]' ; Paroxysmal nocturnal dyspnea'[ ]'   . Pulmonary: Cough '[ ]' ; Wheezing'[ ]' ; Hemoptysis'[ ]' ; Sputum '[ ]' ; Snoring '[ ]'   . GI: Vomiting'[ ]' ; Dysphagia'[ ]' ; Melena'[ ]' ; Hematochezia '[ ]' ; Heartburn'[ ]' ; Abdominal pain '[ ]' ; Constipation '[ ]' ; Diarrhea '[ ]' ; BRBPR '[ ]'   . GU: Hematuria'[ ]' ; Dysuria '[ ]' ; Nocturia'[ ]'   . Vascular: Pain in legs with walking '[ ]' ; Pain in feet with lying flat '[ ]' ; Non-healing sores '[ ]' ; Stroke '[ ]' ; TIA '[ ]' ; Slurred speech '[ ]' ;  . Neuro: Headaches'[ ]' ; Vertigo'[ ]' ; Seizures'[ ]' ; Paresthesias'[ ]' ;Blurred vision '[ ]' ; Diplopia '[ ]' ; Vision changes '[ ]'   . Ortho/Skin: Arthritis '[ ]' ; Joint pain '[ ]' ; Muscle pain '[ ]' ; Joint swelling '[ ]' ; Back Pain '[ ]' ; Rash '[ ]'   . Psych: Depression'[ ]' ; Anxiety'[ ]'   . Heme: Bleeding problems '[ ]' ; Clotting disorders '[ ]' ; Anemia '[ ]'   . Endocrine: Diabetes '[ ]' ; Thyroid  dysfunction'[ ]'   Home Medications Prior to Admission medications   Medication Sig Start Date End Date Taking? Authorizing Provider  allopurinol (ZYLOPRIM) 100 MG tablet Take 1 tablet (100 mg total) by mouth daily. 10/18/19  Yes Marin Olp, MD  amLODipine (NORVASC) 10 MG tablet Take 1 tablet (10 mg total) by mouth daily. 10/18/19  Yes Marin Olp, MD  aspirin EC 81 MG tablet Take 1 tablet (81 mg total) by mouth daily. Swallow whole. 06/16/20  Yes Chandrasekhar, Mahesh A, MD  carvedilol (COREG) 6.25 MG tablet Take 1 tablet (6.25 mg total) by mouth 2 (two) times daily. 05/26/20  Yes Chandrasekhar, Mahesh A, MD  chlorthalidone (HYGROTON) 25 MG tablet Take 1 tablet (25 mg total) by mouth daily. 10/18/19  Yes Marin Olp, MD  donepezil (ARICEPT) 10 MG tablet Take 1 tablet (10 mg total) by mouth at bedtime. 06/08/20  Yes Ward Givens, NP  levETIRAcetam (KEPPRA) 500 MG tablet Take 1 tablet (500 mg total) by mouth 2 (two) times daily. 06/08/20  Yes Ward Givens, NP  rosuvastatin (CRESTOR) 20 MG tablet Take 1 tablet (20 mg total) by mouth  daily. 11/20/19  Yes Marin Olp, MD  nitroGLYCERIN (NITROSTAT) 0.4 MG SL tablet Place 1 tablet (0.4 mg total) under the tongue every 5 (five) minutes as needed for chest pain. 06/16/20 09/14/20  Werner Lean, MD    Past Medical History: Past Medical History:  Diagnosis Date  . Heart murmur   . HYPERLIPIDEMIA 02/22/2007  . HYPERTENSION 02/22/2007  . MCI (mild cognitive impairment) with memory loss 05/14/2014  . Seizures ([place])    treated for siezures around 2010    Past Surgical History: Past Surgical History:  Procedure Laterality Date  . CARPAL TUNNEL RELEASE Bilateral   . CHOLECYSTECTOMY N/A 06/15/2018   Procedure: LAPAROSCOPIC CHOLECYSTECTOMY;  Surgeon: Kinsinger, Arta Bruce, MD;  Location: [facility];  Service: General;  Laterality: N/A;  . CORONARY ANGIOGRAPHY N/A 06/23/2020   Procedure: CORONARY ANGIOGRAPHY;  Surgeon: Troy Sine, MD;  Location: [facility];  Service: Cardiovascular;  Laterality: N/A;  . KNEE ARTHROSCOPY Right     Family History: Family History  Problem Relation Age of Onset  . Diabetes Paternal Grandmother        raised by grandparents    Social History: Social History   Socioeconomic History  . Marital status: Married    Spouse name: [name]  . Number of children: 2  . Years of education: 10  . Highest education level: Not on file  Occupational History    Employer: RETIRED  Tobacco Use  . Smoking status: Former Smoker    Packs/day: 1.00    Years: 30.00    Pack years: 30.00    Types: Cigars, Cigarettes    Quit date: 07/11/1978    Years since quitting: 41.9  . Smokeless tobacco: Never Used  Vaping Use  . Vaping Use: Never used  Substance and Sexual Activity  . Alcohol use: Not Currently    Alcohol/week: 0.0 standard drinks  . Drug use: Yes    Types: Benzodiazepines  . Sexual activity: Not Currently  Other Topics Concern  . Not on file  Social History Narrative   Patient is married ([name]).   Patient has 3 children. 2 grandkids. 3 greatgrandkids.    Patient is retired Administrator.    Patient is right-handed.   Patient has a 10th grade education.   Patient drinks one cup of coffee daily.      Hobbies: working in the yard- gardening competition with neighbor on who can grow best vegetables, "messing around the house"      07/21/14- wants wife to make decisions for him. Not sure if he has advanced directives.       Social Determinants of Health   Financial Resource Strain: Not on file  Food Insecurity: Not on file  Transportation Needs: Not on file  Physical Activity: Not on file  Stress: Not on file  Social Connections: Not on file    Allergies:  No Known Allergies  Objective:    Vital Signs:   Temp:  [97.3 F (36.3 C)-99.1 F (37.3 C)] 97.3 F (36.3 C) (12/13 0800) Pulse Rate:  [72-88] 79 (12/13 0800) Resp:  [15-21] 18 (12/13 0800) BP:  (103-133)/(75-89) 120/80 (12/13 0800) SpO2:  [93 %-100 %] 96 % (12/13 0800) Weight:  [71.5 kg] 71.5 kg (12/13 0400) Last BM Date: 06/20/20  Weight change: Filed Weights   06/14/2020 0958 06/22/20 0400  Weight: 73.3 kg 71.5 kg    Intake/Output:   Intake/Output Summary (Last 24 hours) at 06/22/2020 0848 Last data filed at 06/22/2020 0600  Gross per 24 hour  Intake 3958.07 ml  Output 450 ml  Net 3508.07 ml      Physical Exam    CVP 7 General:  Well appearing elderly AAM. No resp difficulty HEENT: normal Neck: supple. CVP 7 cm Carotids 2+ bilat; no bruits. No lymphadenopathy or thyromegaly appreciated. Cor: PMI nondisplaced. Regular rate & rhythm. No rubs, gallops or murmurs. Lungs: clear Abdomen: soft, nontender, nondistended. No hepatosplenomegaly. No bruits or masses. Good bowel sounds. Extremities: no cyanosis, clubbing, rash, edema Neuro: alert & orientedx3, cranial nerves grossly intact. moves all 4 extremities w/o difficulty. Affect pleasant   Telemetry   NSR 80s   EKG    No EKG this admit to review  Labs   Basic Metabolic Panel: Recent Labs  Lab 06/16/20 1052 06/29/2020 1755 06/17/2020 2232 06/20/20 0119 06/20/20 0612 06/21/20 0104 06/22/20 0426  NA 136  --  136 138  --  136 135  K 4.5  --  3.6 4.5  --  5.2* 4.0  CL 99  --  103 104  --  102 103  CO2 24  --  20* 20*  --  19* 22  GLUCOSE 84  --  118* 145*  --  124* 114*  BUN 12  --  15 16  --  40* 41*  CREATININE 1.42* 1.22 1.62* 1.84*  --  2.79* 1.97*  CALCIUM 9.5  --  8.5* 9.1  --  8.9 8.1*  MG  --   --   --   --  2.0  --   --     Liver Function Tests: No results for input(s): AST, ALT, ALKPHOS, BILITOT, PROT, ALBUMIN in the last 168 hours. No results for input(s): LIPASE, AMYLASE in the last 168 hours. No results for input(s): AMMONIA in the last 168 hours.  CBC: Recent Labs  Lab 07/01/2020 1755 07/09/2020 2232 06/20/20 0119 06/21/20 0104 06/22/20 0426  WBC 4.5 6.2 10.1 6.2 8.3  HGB 12.1*  11.1* 11.9* 11.8* 9.8*  HCT 33.7* 31.4* 35.7* 34.4* 29.2*  MCV 88.2 89.7 92.0 90.8 92.4  PLT 187 184 199 187 156    Cardiac Enzymes: No results for input(s): CKTOTAL, CKMB, CKMBINDEX, TROPONINI in the last 168 hours.  BNP: BNP (last 3 results) No results for input(s): BNP in the last 8760 hours.  ProBNP (last 3 results) No results for input(s): PROBNP in the last 8760 hours.   CBG: No results for input(s): GLUCAP in the last 168 hours.  Coagulation Studies: No results for input(s): LABPROT, INR in the last 72 hours.   Imaging   DG CHEST PORT 1 VIEW  Result Date: 06/21/2020 CLINICAL DATA:  Central line placement. EXAM: PORTABLE CHEST 1 VIEW COMPARISON:  June 20, 2020. FINDINGS: The heart size and mediastinal contours are within normal limits. Interval placement of left subclavian catheter with distal tip in expected position of cavoatrial junction. No pneumothorax or pleural effusion is noted. Both lungs are clear. The visualized skeletal structures are unremarkable. IMPRESSION: Interval placement of left subclavian catheter with distal tip in expected position of cavoatrial junction. No pneumothorax seen. Electronically Signed   By: Marijo Conception M.D.   On: 06/21/2020 16:10      Medications:     Current Medications: . allopurinol  100 mg Oral Daily  . aspirin EC  81 mg Oral Daily  . Chlorhexidine Gluconate Cloth  6 each Topical Daily  . donepezil  10 mg Oral QHS  . feeding supplement  237 mL  Oral BID BM  . influenza vaccine adjuvanted  0.5 mL Intramuscular Tomorrow-1000  . levETIRAcetam  500 mg Oral BID  . rosuvastatin  40 mg Oral Daily  . sodium chloride flush  3 mL Intravenous Q12H     Infusions: . sodium chloride Stopped (06/21/20 2154)  . sodium chloride 10 mL/hr at 06/22/20 0600  . DOBUTamine 2.5 mcg/kg/min (06/22/20 0600)  . heparin 600 Units/hr (06/22/20 0600)      Assessment/Plan   1. Multivessel CAD - LM involvement. Cath 12/13: focal 85%  distal left main stenosis; diffuse 40% LAD stenosis with 50% mid stenosis and 90 and 80% stenoses in the proximal and mid diagonal vessel; 30% ostial circumflex stenosis with 70% mid stenosis and 80% stenosis in the second marginal branch; diffusely irregular dominant RCA with 70% mid stenosis, 60% stenosis proximal to the PDA takeoff with 70 and 95% proximal and distal PDA stenoses and 50% inferior LV stenosis. - Currently CP free. On IV heparin (LV thrombus) - being considered for potential CABG, pre-op evaluation pending  - continue ASA 81  - Crestor 40 mg qhs - no  blocker w/ CGS   2. Acute Biventricular Heat Failure>>Cardiogenic Shock  - Echo 2018 showed nl LVEF, 60-65% w/ severe LVH and nl RV - Echo this admit w/ reduced LVEF 25-30%, diffuse HK, severe LVH, GIIDD, moderately reduced RV - Developed CGS. Initial co-ox 42%. Not a candidate for mechanical support given LV thrombus. Started on DBA 2.5 mcg/kg/min - likely ischemic CM from severe multivessel CAD but also ? component of cardiac amyloid given h/o bilateral carpal tunnel, severe LVH and thicken aortic valve - Check PYP Scan and multiple myeloma panel  - Continue DBA 2.5 for inotropic support. Will repeat Co-ox (resulted at 93% this am, doubt accurate ? Air bubble) - follow lactic acid for clearance - no  blocker w/ shock  - no diuretics currently, CVP 6-7  - no ARB/ARNi, spiro nor dig w/ AKI   3. LV Thrombus  - continue heparin gtt for now  - switch to oral anticoagulant after invasive procedures/ surgeries completed  4. AKI - baseline SCr 1.4 - bumped to 2.79 in setting of CGS - improving w/ inotropic support, down to 1.97 today - continue DBA - follow BMP   5. ? Aortic Stenosis  - Echo showed severely calcified, tricuspid, AV. Because left ventricular function and stroke volume are significantly reduced, cannot exclude low flow low gradient aortic stenosis; significant valvular calcification. AV could not be crossed  during catheterization.  - if CABG is elected, valve to be inspected for ? AVR - avoid hypotension   6. HLD w/ Goal LDL < 70 - most recent LP 5/21 showed elevated LDL at 122 mg/dL - on Crestor 20 mg PTA - Crestor has been increased to 40 mg qhs.   7. HTN: - controlled currently   8. Anemia - Drop in Hgb from 11.8>>9.8 overnight - Cardiac cath was done via Rt Femoral Artery (stable on exam) - requiring heparin for LV thrombus  - BP stable. Denies flank/ LBP - Check stat repeat CBC - if  Hgb still low, will obtain CT scan to r/o RPB  9. H/o Bilateral Carpal Tunnel - s/p bilateral Carpal Tunnel Release  - w/u for amyloid as outlined above  Length of Stay: 3  Brittainy Simmons, PA-C  06/22/2020, 8:48 AM  Advanced Heart Failure Team Pager [phone] (M-F; 7a - 4p)  Please contact [facility] for night-coverage after hours (4p -7a )  and weekends on [email or web address]  Patient seen with PA, agree with the above note.   86 y.o. with minimal prior cardiac symptoms has been having "spells" of dyspnea, chest pain, and lightheadedness with ambulation for several months.  He will have to sit down or lie down.  No syncope.  He had a coronary CTA done as workup that was markedly abnormal.  On 12/10, he had LHC showing severe LM stenosis and 3VD, he was admitted for possible CABG.  Echo showed EF around 30% with moderate LVH and possible LV thrombus, moderately decreased RV systolic function, moderate MR, possible low flow/low gradient moderate AS. He developed AKI post-cath with creatinine up to 2.79.  He was started on heparin gtt for possible LV thrombus.   Yesterday, CVL was placed.  Co-ox initially 42%, started on dobutamine 2.5.  Co-ox up to 71% today.  Lactate was 4.5 initially, down to 2.5 this morning.  CVP 5-6 on my measurement.  Creatinine down to 1.97 today.   Today, no complaints in bed.  Had a spell of dyspnea, weakness when he went walking.   General: NAD Neck: JVP 7 cm, no  thyromegaly or thyroid nodule.  Lungs: Clear to auscultation bilaterally with normal respiratory effort. CV: Nondisplaced PMI.  Heart regular S1/S2, no S3/S4, 2/6 SEM RUSB with clear S2.  No peripheral edema. No carotid bruit.  Normal pedal pulses.  Abdomen: Soft, nontender, no hepatosplenomegaly, no distention.  Skin: Intact without lesions or rashes.  Neurologic: Alert and oriented x 3.  Psych: Normal affect. Extremities: No clubbing or cyanosis.  HEENT: Normal.   1. CAD: LHC done with angina and abnormal coronary CTA.  This showed 85% left main stenosis, 70% mRCA, 70% PDA, serial 70-80% mid-distal LCx, 90% D1, 50% mLAD.  Anatomy is best suited for CABG.   - Currently working to stabilize patient for high risk CABG.  - Continue ASA 81 daily and Crestor 40 daily.  2. LV thrombus: Smoke in LV apex, difficult to definitively rule in/out LV thrombus from images available.  - Continue heparin gtt.  - To get limited echo to reassess AoV, will assess for residual thrombus as well.  3. Aortic stenosis: Possible low flow/low gradient moderate AS.  Doubt severe.   - Limited echo today to reassess aortic valve now that he is on dobutamine.  4. Acute systolic CHF: Echo was reviewed, EF around 30% with moderate LVH and possible LV thrombus, moderately decreased RV systolic function, moderate MR, possible low flow/low gradient moderate AS.  Co-ox improved on dobutamine 2.5 and lactate decreasing.  CVP 7, co-ox up to 71%. He does not look volume overloaded on exam.  - Continue dobutamine 2.5.  - No ARB/ARNI/spironolactone/digoxin yet with recent AKI.  - No beta blocker with low output HF.  - Would arrange for PYP scan to look for TTR cardiac amyloidosis, has history of bilateral carpal tunnel and moderate LVH. Will send myeloma panel, urine immunofixation.  5. AKI on CKD stage 3: Contrast may have played a role as well as low output HF.  Creatinine trending down, 1.79 today, with inotrope.  Baseline  creatinine around 1.4.   Loralie Champagne 06/22/2020 12:21 PM

## 2020-06-22 NOTE — Progress Notes (Signed)
Renal function improved, SvO2 looks okay. Management per cardiology, PCCM available PRN.  Erskine Emery MD PCCM

## 2020-06-22 NOTE — Progress Notes (Signed)
Pt was walking with PT when he had syncope episode. Pt did not fall, Physical Therapy was holding pt up with gait belt and knee, pt was assisted into a chair and then after a few seconds was able to response. VSS. Pt assisted back to bed, and he reports feeling fine.

## 2020-06-22 NOTE — Plan of Care (Signed)
°  Problem: Clinical Measurements: °Goal: Ability to maintain clinical measurements within normal limits will improve °Outcome: Progressing °Goal: Will remain free from infection °Outcome: Progressing °Goal: Diagnostic test results will improve °Outcome: Progressing °Goal: Respiratory complications will improve °Outcome: Progressing °  °

## 2020-06-22 NOTE — Progress Notes (Signed)
Initial Nutrition Assessment  DOCUMENTATION CODES:   Severe malnutrition in context of chronic illness  INTERVENTION:   - ProSource Plus 30 ml po BID, each supplement provides 100 kcal and 15 grams of protein  - Ensure Enlive po QID, each supplement provides 350 kcal and 20 grams of protein  - 48-hour calorie count ordered, RD to follow up with day 1 results tomorrow 12/14  NUTRITION DIAGNOSIS:   Severe Malnutrition related to chronic illness (CAD, CKD stage III, CHF) as evidenced by severe muscle depletion,severe fat depletion.  GOAL:   Patient will meet greater than or equal to 90% of their needs  MONITOR:   PO intake,Supplement acceptance,Labs,Weight trends,I & O's  REASON FOR ASSESSMENT:   Consult Assessment of nutrition requirement/status,Calorie Count,Diet education  ASSESSMENT:   86 year old male with PMH of multivessel CAD, CKD stage III, seizures, HTN, HLD. Pt being considered for potential CABG. Pt found to have acute biventricular heart failure.   A 48-hour calorie count has been ordered. Discussed with RN. Calorie count envelope placed on pt's door by this RD.  Pt sleeping soundly at time of RD visit but awoke easily to RD voice. Pt reports appetite is okay. He states that he isn't eating much at meals but is drinking Ensure. Confirmed this with RN. Pt states that he has consumed 2 Ensure supplements so far today. He states that he thinks he can drink 2-3 daily. RD to increase Ensure Enlive order from BID to QID. Will also order ProSource Plus BID.  Pt reports that at home he tries to eat 3 meals a day. He states that his appetite isn't great but that he forces himself to eat.  Pt endorses some weight loss. He reports his UBW as 165 lbs and states he now weighs 156 lbs.  Reviewed weight history in chart. Pt with a 3.1 kg weight loss since 06/08/20. This is a 4.2% weight loss in 2 weeks which is significant for timeframe.  Medications reviewed and include:  Ensure Enlive BID, dobutamine, heparin  Labs reviewed: BUN 41, creatinine 1.97, hemoglobin 9.8  UOP: 650 ml x 24 hours I/O's: +5.3 L since admit  NUTRITION - FOCUSED PHYSICAL EXAM:  Flowsheet Row Most Recent Value  Orbital Region Severe depletion  Upper Arm Region Severe depletion  Thoracic and Lumbar Region Moderate depletion  Buccal Region Moderate depletion  Temple Region Moderate depletion  Clavicle Bone Region Severe depletion  Clavicle and Acromion Bone Region Severe depletion  Scapular Bone Region Moderate depletion  Dorsal Hand Moderate depletion  Patellar Region Moderate depletion  Anterior Thigh Region Severe depletion  Posterior Calf Region Moderate depletion  Edema (RD Assessment) None  Hair Reviewed  Eyes Reviewed  Mouth Reviewed  Skin Reviewed  Nails Reviewed       Diet Order:   Diet Order            Diet Heart Room service appropriate? Yes; Fluid consistency: Thin  Diet effective now                 EDUCATION NEEDS:   Education needs have been addressed  Skin:  Skin Assessment: Reviewed RN Assessment  Last BM:  06/20/20  Height:   Ht Readings from Last 1 Encounters:  07/07/2020 5\' 9"  (1.753 m)    Weight:   Wt Readings from Last 1 Encounters:  06/22/20 71.5 kg    Ideal Body Weight:  72.7 kg  BMI:  Body mass index is 23.28 kg/m.  Estimated Nutritional Needs:  Kcal:  1800-2000  Protein:  90-110 grams  Fluid:  1.8-2.0 L    Gustavus Bryant, MS, RD, LDN Inpatient Clinical Dietitian Please see [facility] for contact information.

## 2020-06-22 NOTE — Care Management Important Message (Signed)
Important Message  Patient Details  Name: Isaiah Davidson MRN: [id number] Date of Birth: 09/17/1931   Medicare Important Message Given:  Yes     Orbie Pyo 06/22/2020, 4:17 PM

## 2020-06-23 ENCOUNTER — Inpatient Hospital Stay (HOSPITAL_COMMUNITY): Payer: Medicare HMO

## 2020-06-23 ENCOUNTER — Encounter (HOSPITAL_COMMUNITY): Payer: Self-pay | Admitting: Cardiovascular Disease

## 2020-06-23 LAB — ECHOCARDIOGRAM LIMITED
AR max vel: 1.15 cm2
AV Area VTI: 1.16 cm2
AV Area mean vel: 1.14 cm2
AV Mean grad: 10 mmHg
AV Peak grad: 14.9 mmHg
Ao pk vel: 1.93 m/s
Area-P 1/2: 5.02 cm2
Height: 69 in
Weight: 2522.06 oz

## 2020-06-23 LAB — BASIC METABOLIC PANEL
Anion gap: 7 (ref 5–15)
BUN: 36 mg/dL — ABNORMAL HIGH (ref 8–23)
CO2: 24 mmol/L (ref 22–32)
Calcium: 8.2 mg/dL — ABNORMAL LOW (ref 8.9–10.3)
Chloride: 104 mmol/L (ref 98–111)
Creatinine, Ser: 1.71 mg/dL — ABNORMAL HIGH (ref 0.61–1.24)
GFR, Estimated: 39 mL/min — ABNORMAL LOW (ref >60)
Glucose, Bld: 103 mg/dL — ABNORMAL HIGH (ref 70–99)
Potassium: 3.7 mmol/L (ref 3.5–5.1)
Sodium: 135 mmol/L (ref 135–145)

## 2020-06-23 LAB — HEPARIN LEVEL (UNFRACTIONATED)
Heparin Unfractionated: 0.25 IU/mL — ABNORMAL LOW (ref 0.30–0.70)
Heparin Unfractionated: 0.4 [IU]/mL (ref 0.30–0.70)

## 2020-06-23 LAB — CBC
HCT: 28.4 % — ABNORMAL LOW (ref 39.0–52.0)
Hemoglobin: 9.3 g/dL — ABNORMAL LOW (ref 13.0–17.0)
MCH: 30.6 pg (ref 26.0–34.0)
MCHC: 32.7 g/dL (ref 30.0–36.0)
MCV: 93.4 fL (ref 80.0–100.0)
Platelets: 149 10*3/uL — ABNORMAL LOW (ref 150–400)
RBC: 3.04 MIL/uL — ABNORMAL LOW (ref 4.22–5.81)
RDW: 13.4 % (ref 11.5–15.5)
WBC: 7.3 10*3/uL (ref 4.0–10.5)
nRBC: 0 % (ref 0.0–0.2)

## 2020-06-23 LAB — COOXEMETRY PANEL
Carboxyhemoglobin: 0.8 % (ref 0.5–1.5)
Methemoglobin: 0.8 % (ref 0.0–1.5)
O2 Saturation: 88.5 %
Total hemoglobin: 9.5 g/dL — ABNORMAL LOW (ref 12.0–16.0)

## 2020-06-23 LAB — LACTIC ACID, PLASMA: Lactic Acid, Venous: 1.3 mmol/L (ref 0.5–1.9)

## 2020-06-23 MED ORDER — TECHNETIUM TC 99M PYROPHOSPHATE
21.8000 | Freq: Once | INTRAVENOUS | Status: AC | PRN
  Administered 2020-06-23: 10:00:00 21.8 via INTRAVENOUS
  Filled 2020-06-23: qty 22

## 2020-06-23 NOTE — Progress Notes (Signed)
ANTICOAGULATION CONSULT NOTE  Pharmacy Consult for Heparin  Indication: LV thrombus  No Known Allergies  Patient Measurements: Height: 5\' 9"  (175.3 cm) Weight: 74 kg (163 lb 2.3 oz) IBW/kg (Calculated) : 70.7 Heparin Dosing Weight: TBW  Vital Signs: Temp: 98.7 F (37.1 C) (12/14 1505) Temp Source: Oral (12/14 1505) BP: 119/76 (12/14 1505) Pulse Rate: 98 (12/14 1505)  Labs: Recent Labs    06/21/20 0104 06/21/20 1220 06/22/20 0426 06/22/20 0732 06/22/20 1005 06/22/20 1807 06/23/20 0400 06/23/20 0426 06/23/20 1252  HGB 11.8*  --  9.8*  --  10.3*  --  9.3*  --   --   HCT 34.4*  --  29.2*  --  29.4*  --  28.4*  --   --   PLT 187  --  156  --  152  --  149*  --   --   HEPARINUNFRC 0.98*   < >  --    < >  --  0.14*  --  0.25* 0.40  CREATININE 2.79*  --  1.97*  --   --   --  1.71*  --   --    < > = values in this interval not displayed.    Estimated Creatinine Clearance: 31 mL/min (A) (by C-G formula based on SCr of 1.71 mg/dL (H)).   Assessment: 86 year old male presented to [facility] for diagnostic cath for ongoing angina and was found to have multivessel disease; awaiting surgical consultation.   Echo showed low EF and LV thrombus; pharmacy was consulted to start IV heparin. It does not appear that he was on anticoagulant prior to admit.  Heparin level 0.4 therapeutic on heparin drip 1050 units/hr. No issues with line or bleeding reported per RN. Heparin running in central line and levels being drawn by lab peripherally.  Goal of Therapy:  Heparin level 0.3-0.7 units/ml Monitor platelets by anticoagulation protocol: Yes   Plan:  Continue IV heparin 1050 units/hr Daily HL and CBC   Bonnita Nasuti Pharm.D. CPP, BCPS Clinical Pharmacist [phone] 06/23/2020 3:39 PM    Please see amion for complete clinical pharmacist phone list  06/23/2020 3:30 PM

## 2020-06-23 NOTE — Progress Notes (Signed)
OT Cancellation Note  Patient Details Name: Isaiah Davidson MRN: [id number] DOB: [birth year]   Cancelled Treatment:    Reason Eval/Treat Not Completed: Patient at procedure or test/ unavailable at Nuclear medicine, OT will continue to follow for evaluation.  [place] 06/23/2020, 12:36 PM   Jesse Sans OTR/L Acute Rehabilitation Services Pager: [phone] Office: [phone]

## 2020-06-23 NOTE — Progress Notes (Addendum)
Advanced Heart Failure Rounding Note  PCP-Cardiologist: No primary care provider on file.   Subjective:    Stable. No angina at rest.  Being considered for CABG. PFTs c/w severe obstructive airway disease.   Co-ox 88% on 2.5 of DBA. Rhythm stable.  Lactic acid down-trending but slow clearance, 2.5>>2.2. Repeat pending   CVP 4-6.   Repeat limited echo completed. Interpretation pending.   On for PYP scan today. Multiple Myeloma Panel and Urine Immunofixation in process.   Objective:   Weight Range: 74 kg Body mass index is 24.09 kg/m.   Vital Signs:   Temp:  [97.5 F (36.4 C)-98.9 F (37.2 C)] 98.5 F (36.9 C) (12/14 0620) Pulse Rate:  [86-96] 87 (12/14 0620) Resp:  [15-20] 16 (12/14 0620) BP: (104-121)/(70-81) 121/81 (12/14 0620) SpO2:  [92 %-100 %] 99 % (12/14 0620) Weight:  [74 kg] 74 kg (12/14 0520) Last BM Date: 06/20/20  Weight change: Filed Weights   06/16/2020 0958 06/22/20 0400 06/23/20 0520  Weight: 73.3 kg 71.5 kg 74 kg    Intake/Output:   Intake/Output Summary (Last 24 hours) at 06/23/2020 0843 Last data filed at 06/23/2020 1601 Gross per 24 hour  Intake 647.66 ml  Output 500 ml  Net 147.66 ml      Physical Exam    General:  Well appearing elderly AAM sitting up in bed. No resp difficulty HEENT: Normal Neck: Supple. JVP 6 cm . Carotids 2+ bilat; no bruits. No lymphadenopathy or thyromegaly appreciated. Cor: PMI nondisplaced. Regular rate & rhythm. No rubs, gallops or murmurs. Lungs: Clear Abdomen: Soft, nontender, nondistended. No hepatosplenomegaly. No bruits or masses. Good bowel sounds. Extremities: No cyanosis, clubbing, rash, edema Neuro: Alert & orientedx3, cranial nerves grossly intact. moves all 4 extremities w/o difficulty. Affect pleasant   Telemetry   NSR 60s   EKG    No new EKG to review   Labs    CBC Recent Labs    06/22/20 1005 06/23/20 0400  WBC 8.1 7.3  HGB 10.3* 9.3*  HCT 29.4* 28.4*  MCV 92.2 93.4  PLT  152 093*   Basic Metabolic Panel Recent Labs    06/22/20 0426 06/23/20 0400  NA 135 135  K 4.0 3.7  CL 103 104  CO2 22 24  GLUCOSE 114* 103*  BUN 41* 36*  CREATININE 1.97* 1.71*  CALCIUM 8.1* 8.2*   Liver Function Tests No results for input(s): AST, ALT, ALKPHOS, BILITOT, PROT, ALBUMIN in the last 72 hours. No results for input(s): LIPASE, AMYLASE in the last 72 hours. Cardiac Enzymes No results for input(s): CKTOTAL, CKMB, CKMBINDEX, TROPONINI in the last 72 hours.  BNP: BNP (last 3 results) No results for input(s): BNP in the last 8760 hours.  ProBNP (last 3 results) No results for input(s): PROBNP in the last 8760 hours.   D-Dimer No results for input(s): DDIMER in the last 72 hours. Hemoglobin A1C No results for input(s): HGBA1C in the last 72 hours. Fasting Lipid Panel No results for input(s): CHOL, HDL, LDLCALC, TRIG, CHOLHDL, LDLDIRECT in the last 72 hours. Thyroid Function Tests No results for input(s): TSH, T4TOTAL, T3FREE, THYROIDAB in the last 72 hours.  Invalid input(s): FREET3  Other results:   Imaging     No results found.   Medications:     Scheduled Medications: . (feeding supplement) PROSource Plus  30 mL Oral BID BM  . allopurinol  100 mg Oral Daily  . aspirin EC  81 mg Oral Daily  . Chlorhexidine  Gluconate Cloth  6 each Topical Daily  . donepezil  10 mg Oral QHS  . feeding supplement  237 mL Oral QID  . influenza vaccine adjuvanted  0.5 mL Intramuscular Tomorrow-1000  . levETIRAcetam  500 mg Oral BID  . rosuvastatin  40 mg Oral Daily  . sodium chloride flush  3 mL Intravenous Q12H     Infusions: . sodium chloride Stopped (06/21/20 2154)  . sodium chloride Stopped (06/23/20 0416)  . DOBUTamine 2.5 mcg/kg/min (06/23/20 0605)  . heparin 1,050 Units/hr (06/23/20 0605)     PRN Medications:  sodium chloride, acetaminophen, nitroGLYCERIN, ondansetron (ZOFRAN) IV, sodium chloride flush    Assessment/Plan   1. CAD: LHC done  with angina and abnormal coronary CTA.  This showed 85% left main stenosis, 70% mRCA, 70% PDA, serial 70-80% mid-distal LCx, 90% D1, 50% mLAD.  Anatomy is best suited for CABG.   - Currently working to stabilize patient for potential high risk CABG, pending pre-operative studies. PFTs show severe obstructive airway disease  - Currently stable. No angina at rest  - Continue ASA 81 daily and Crestor 40 daily.  2. LV thrombus: Smoke in LV apex, difficult to definitively rule in/out LV thrombus from images available.  - Continue heparin gtt.  - To get limited echo to reassess AoV, will assess for residual thrombus as well (study in process) 3. Aortic stenosis: Possible low flow/low gradient moderate AS.  Doubt severe.   - Limited echo today to reassess aortic valve now that he is on dobutamine.  4. Acute systolic CHF: Echo was reviewed, EF around 30% with moderate LVH and possible LV thrombus, moderately decreased RV systolic function, moderate MR, possible low flow/low gradient moderate AS.  Co-ox improved on dobutamine 2.5 and lactate decreasing.  CVP 4-6, co-ox up to 88%. He does not look volume overloaded on exam.  - Continue dobutamine 2.5.  - No ARB/ARNI/spironolactone/digoxin yet with recent AKI.  - No beta blocker with low output HF.  - Plan PYP scan today to look for TTR cardiac amyloidosis, has history of bilateral carpal tunnel and moderate LVH. Multiple Myeloma panel and urine immunofixation in process.  5. AKI on CKD stage 3: SCr bumped to 2.79 this admit. Contrast may have played a role as well as low output HF.  Creatinine trending down, 1.71 today, with inotrope.  Baseline creatinine around 1.4.    Length of Stay: 4  Brittainy Simmons, PA-C  06/23/2020, 8:43 AM  Advanced Heart Failure Team Pager [phone] (M-F; 7a - 4p)  Please contact [facility] for night-coverage after hours (4p -7a ) and weekends on [email or web address]  Patient see with PA, agree with the above note.   Creatinine  trending down, 1.71 today.  CVP 4-6 with co-ox ?88% and lactate down to 1.3.    PFTs today with severe obstruction, he quit smoking >20 years ago.   Limited echo yesterday on dobutamine, EF 35-40% with mild aortic stenosis (mean gradient 10 mmHg) and no LV thrombus visualized.   General: NAD Neck: No JVD, no thyromegaly or thyroid nodule.  Lungs: Clear to auscultation bilaterally with normal respiratory effort. CV: Nondisplaced PMI.  Heart regular S1/S2, no S3/S4, 2/6 early SEM RUSB.  No peripheral edema.   Abdomen: Soft, nontender, no hepatosplenomegaly, no distention.  Skin: Intact without lesions or rashes.  Neurologic: Alert and oriented x 3.  Psych: Normal affect. Extremities: No clubbing or cyanosis.  HEENT: Normal.   Patient currently stable on dobutamine 2.5, creatinine trending down at 1.71   and not volume overloaded.  - No Lasix today.  - Continue dobutamine for now.   Reviewed yesterday's echo, EF 35-40% with mild AS.  No thrombus visualized.  - Continue heparin gtt with ?early thrombus on initial echo.   Based on coronary anatomy, patient would likely be best-served by CABG.  However, age, renal function, CHF, and lung function put him at higher risk.  - Will ask surgeon to speak with him today about CABG at his request.  - PCI would be high risk, multiple severe stenoses and tight left main.  Will review with interventional colleagues in case CABG decided against.   Pending PYP scan for amyloidosis assessment.  Loralie Champagne 06/23/2020 1:45 PM

## 2020-06-23 NOTE — Progress Notes (Signed)
Calorie Count Note: Day 1  48-hour calorie count ordered. Calorie count started on 12/13 at 1500. Please see day 1 results below.  Diet: Heart Healthy, thin liquids Supplements:  - Ensure Enlive/Ensure Plus po QID - ProSource Plus 30 ml po BID  Day 1: 12/13 Dinner: 11 kcal, 1 gram of protein 12/14 Breakfast: 242 kcal, 5 grams of protein 12/14 Lunch: 106 kcal, 7 grams of protein Supplements: 1090 kcal, 59 grams of protein (3 Ensure Plus, 1 ProSource Plus)  Day 1 total 24-hour intake: 1499 kcal (81% of minimum estimated needs)  72 grams of protein (80% of minimum estimated needs)  Nutrition Diagnosis:  Severe Malnutrition related to chronic illness (CAD, CKD stage III, CHF) as evidenced by severe muscle depletion,severe fat depletion.  Goal: Patient will meet greater than or equal to 90% of their needs  Intervention:  - Continue 48-hour calorie count - Continue Ensure Enlive/Ensure Plus QID - Continue ProSource Plus BID   Gustavus Bryant, MS, RD, LDN Inpatient Clinical Dietitian Please see AMiON for contact information.

## 2020-06-23 NOTE — Plan of Care (Signed)

## 2020-06-23 NOTE — Progress Notes (Addendum)
ANTICOAGULATION CONSULT NOTE  Pharmacy Consult for Heparin  Indication: LV thrombus  No Known Allergies  Patient Measurements: Height: 5\' 9"  (175.3 cm) Weight: 71.5 kg (157 lb 10.1 oz) IBW/kg (Calculated) : 70.7 Heparin Dosing Weight: TBW  Vital Signs: Temp: 98.4 F (36.9 C) (12/14 0037) Temp Source: Oral (12/14 0037) BP: 114/76 (12/14 0037) Pulse Rate: 93 (12/14 0037)  Labs: Recent Labs    06/21/20 0104 06/21/20 1220 06/22/20 0426 06/22/20 0732 06/22/20 1005 06/22/20 1807 06/23/20 0400 06/23/20 0426  HGB 11.8*  --  9.8*  --  10.3*  --  9.3*  --   HCT 34.4*  --  29.2*  --  29.4*  --  28.4*  --   PLT 187  --  156  --  152  --  149*  --   HEPARINUNFRC 0.98*   < >  --  0.17*  --  0.14*  --  0.25*  CREATININE 2.79*  --  1.97*  --   --   --   --   --    < > = values in this interval not displayed.    Estimated Creatinine Clearance: 26.9 mL/min (A) (by C-G formula based on SCr of 1.97 mg/dL (H)).   Assessment: 86 year old male presented to [facility] for diagnostic cath for ongoing angina and was found to have multivessel disease; awaiting surgical consultation.   Echo showed low EF and LV thrombus; pharmacy was consulted to start IV heparin. It does not appear that he was on anticoagulant prior to admit.  Heparin level remains slightly subtherapeutic (0.25) on gtt at 900 units/hr. No issues with line or bleeding reported per RN. Heparin running in central line and levels being drawn by lab peripherally.  Goal of Therapy:  Heparin level 0.3-0.7 units/ml Monitor platelets by anticoagulation protocol: Yes   Plan:  Increase IV heparin to 1050 units/hr Recheck heparin level in 8 hrs  Sherlon Handing, PharmD, BCPS Please see amion for complete clinical pharmacist phone list  06/23/2020 4:59 AM

## 2020-06-24 ENCOUNTER — Inpatient Hospital Stay (HOSPITAL_COMMUNITY): Payer: Medicare HMO

## 2020-06-24 ENCOUNTER — Inpatient Hospital Stay (HOSPITAL_COMMUNITY): Payer: Medicare HMO | Admitting: Registered Nurse

## 2020-06-24 DIAGNOSIS — E43 Unspecified severe protein-calorie malnutrition: Secondary | ICD-10-CM

## 2020-06-24 DIAGNOSIS — J9601 Acute respiratory failure with hypoxia: Secondary | ICD-10-CM

## 2020-06-24 DIAGNOSIS — R570 Cardiogenic shock: Secondary | ICD-10-CM

## 2020-06-24 DIAGNOSIS — I25110 Atherosclerotic heart disease of native coronary artery with unstable angina pectoris: Principal | ICD-10-CM

## 2020-06-24 DIAGNOSIS — I214 Non-ST elevation (NSTEMI) myocardial infarction: Secondary | ICD-10-CM

## 2020-06-24 LAB — CBC
HCT: 28.8 % — ABNORMAL LOW (ref 39.0–52.0)
HCT: 30.2 % — ABNORMAL LOW (ref 39.0–52.0)
Hemoglobin: 9.5 g/dL — ABNORMAL LOW (ref 13.0–17.0)
Hemoglobin: 9.7 g/dL — ABNORMAL LOW (ref 13.0–17.0)
MCH: 30.5 pg (ref 26.0–34.0)
MCH: 30.4 pg (ref 26.0–34.0)
MCHC: 33 g/dL (ref 30.0–36.0)
MCHC: 32.1 g/dL (ref 30.0–36.0)
MCV: 92.6 fL (ref 80.0–100.0)
MCV: 94.7 fL (ref 80.0–100.0)
Platelets: 161 10*3/uL (ref 150–400)
Platelets: 173 10*3/uL (ref 150–400)
RBC: 3.11 MIL/uL — ABNORMAL LOW (ref 4.22–5.81)
RBC: 3.19 MIL/uL — ABNORMAL LOW (ref 4.22–5.81)
RDW: 13.4 % (ref 11.5–15.5)
RDW: 13.6 % (ref 11.5–15.5)
WBC: 6.8 10*3/uL (ref 4.0–10.5)
WBC: 8.1 10*3/uL (ref 4.0–10.5)
nRBC: 0 % (ref 0.0–0.2)
nRBC: 0 % (ref 0.0–0.2)

## 2020-06-24 LAB — GLUCOSE, CAPILLARY
Glucose-Capillary: 114 mg/dL — ABNORMAL HIGH (ref 70–99)
Glucose-Capillary: 125 mg/dL — ABNORMAL HIGH (ref 70–99)
Glucose-Capillary: 133 mg/dL — ABNORMAL HIGH (ref 70–99)
Glucose-Capillary: 156 mg/dL — ABNORMAL HIGH (ref 70–99)
Glucose-Capillary: 67 mg/dL — ABNORMAL LOW (ref 70–99)
Glucose-Capillary: 89 mg/dL (ref 70–99)
Glucose-Capillary: 96 mg/dL (ref 70–99)

## 2020-06-24 LAB — BASIC METABOLIC PANEL
Anion gap: 8 (ref 5–15)
Anion gap: 14 (ref 5–15)
Anion gap: 9 (ref 5–15)
BUN: 35 mg/dL — ABNORMAL HIGH (ref 8–23)
BUN: 38 mg/dL — ABNORMAL HIGH (ref 8–23)
BUN: 39 mg/dL — ABNORMAL HIGH (ref 8–23)
CO2: 21 mmol/L — ABNORMAL LOW (ref 22–32)
CO2: 19 mmol/L — ABNORMAL LOW (ref 22–32)
CO2: 22 mmol/L (ref 22–32)
Calcium: 8.1 mg/dL — ABNORMAL LOW (ref 8.9–10.3)
Calcium: 7.9 mg/dL — ABNORMAL LOW (ref 8.9–10.3)
Calcium: 8.3 mg/dL — ABNORMAL LOW (ref 8.9–10.3)
Chloride: 104 mmol/L (ref 98–111)
Chloride: 102 mmol/L (ref 98–111)
Chloride: 104 mmol/L (ref 98–111)
Creatinine, Ser: 1.79 mg/dL — ABNORMAL HIGH (ref 0.61–1.24)
Creatinine, Ser: 2.11 mg/dL — ABNORMAL HIGH (ref 0.61–1.24)
Creatinine, Ser: 2.31 mg/dL — ABNORMAL HIGH (ref 0.61–1.24)
GFR, Estimated: 36 mL/min — ABNORMAL LOW (ref >60)
GFR, Estimated: 30 mL/min — ABNORMAL LOW (ref >60)
GFR, Estimated: 27 mL/min — ABNORMAL LOW (ref >60)
Glucose, Bld: 132 mg/dL — ABNORMAL HIGH (ref 70–99)
Glucose, Bld: 211 mg/dL — ABNORMAL HIGH (ref 70–99)
Glucose, Bld: 129 mg/dL — ABNORMAL HIGH (ref 70–99)
Potassium: 3.8 mmol/L (ref 3.5–5.1)
Potassium: 4 mmol/L (ref 3.5–5.1)
Potassium: 4.2 mmol/L (ref 3.5–5.1)
Sodium: 133 mmol/L — ABNORMAL LOW (ref 135–145)
Sodium: 135 mmol/L (ref 135–145)
Sodium: 135 mmol/L (ref 135–145)

## 2020-06-24 LAB — HEPARIN LEVEL (UNFRACTIONATED)
Heparin Unfractionated: 0.3 IU/mL (ref 0.30–0.70)
Heparin Unfractionated: 0.55 IU/mL (ref 0.30–0.70)

## 2020-06-24 LAB — MULTIPLE MYELOMA PANEL, SERUM
Albumin SerPl Elph-Mcnc: 2.9 g/dL (ref 2.9–4.4)
Albumin/Glob SerPl: 1 (ref 0.7–1.7)
Alpha 1: 0.2 g/dL (ref 0.0–0.4)
Alpha2 Glob SerPl Elph-Mcnc: 0.8 g/dL (ref 0.4–1.0)
B-Globulin SerPl Elph-Mcnc: 0.8 g/dL (ref 0.7–1.3)
Gamma Glob SerPl Elph-Mcnc: 1.3 g/dL (ref 0.4–1.8)
Globulin, Total: 3.1 g/dL (ref 2.2–3.9)
IgA: 252 mg/dL (ref 61–437)
IgG (Immunoglobin G), Serum: 1307 mg/dL (ref 603–1613)
IgM (Immunoglobulin M), Srm: 48 mg/dL (ref 15–143)
Total Protein ELP: 6 g/dL (ref 6.0–8.5)

## 2020-06-24 LAB — POCT I-STAT 7, (LYTES, BLD GAS, ICA,H+H)
Acid-base deficit: 1 mmol/L (ref 0.0–2.0)
Bicarbonate: 22.5 mmol/L (ref 20.0–28.0)
Calcium, Ion: 1.14 mmol/L — ABNORMAL LOW (ref 1.15–1.40)
HCT: 26 % — ABNORMAL LOW (ref 39.0–52.0)
Hemoglobin: 8.8 g/dL — ABNORMAL LOW (ref 13.0–17.0)
O2 Saturation: 100 %
Patient temperature: 98.3
Potassium: 3.5 mmol/L (ref 3.5–5.1)
Sodium: 135 mmol/L (ref 135–145)
TCO2: 23 mmol/L (ref 22–32)
pCO2 arterial: 31.7 mmHg — ABNORMAL LOW (ref 32.0–48.0)
pH, Arterial: 7.459 — ABNORMAL HIGH (ref 7.350–7.450)
pO2, Arterial: 468 mmHg — ABNORMAL HIGH (ref 83.0–108.0)

## 2020-06-24 LAB — BRAIN NATRIURETIC PEPTIDE: B Natriuretic Peptide: 4283 pg/mL — ABNORMAL HIGH (ref 0.0–100.0)

## 2020-06-24 LAB — COOXEMETRY PANEL
Carboxyhemoglobin: 0.8 % (ref 0.5–1.5)
Carboxyhemoglobin: 0.8 % (ref 0.5–1.5)
Methemoglobin: 0.6 % (ref 0.0–1.5)
Methemoglobin: 0.9 % (ref 0.0–1.5)
O2 Saturation: 64.4 %
O2 Saturation: 64.9 %
Total hemoglobin: 9.3 g/dL — ABNORMAL LOW (ref 12.0–16.0)
Total hemoglobin: 10.4 g/dL — ABNORMAL LOW (ref 12.0–16.0)

## 2020-06-24 LAB — IMMUNOFIXATION, URINE

## 2020-06-24 LAB — TROPONIN I (HIGH SENSITIVITY)
Troponin I (High Sensitivity): 16629 ng/L (ref <18)
Troponin I (High Sensitivity): 17343 ng/L (ref <18)

## 2020-06-24 LAB — MAGNESIUM
Magnesium: 2.3 mg/dL (ref 1.7–2.4)
Magnesium: 2.4 mg/dL (ref 1.7–2.4)

## 2020-06-24 LAB — LACTIC ACID, PLASMA: Lactic Acid, Venous: 1.8 mmol/L (ref 0.5–1.9)

## 2020-06-24 LAB — PHOSPHORUS
Phosphorus: 3.6 mg/dL (ref 2.5–4.6)
Phosphorus: 3.5 mg/dL (ref 2.5–4.6)

## 2020-06-24 LAB — MRSA PCR SCREENING: MRSA by PCR: NEGATIVE

## 2020-06-24 MED ORDER — INSULIN ASPART 100 UNIT/ML ~~LOC~~ SOLN
2.0000 [IU] | SUBCUTANEOUS | Status: DC
  Administered 2020-06-24 – 2020-06-25 (×3): 2 [IU] via SUBCUTANEOUS

## 2020-06-24 MED ORDER — FENTANYL 2500MCG IN NS 250ML (10MCG/ML) PREMIX INFUSION
25.0000 ug/h | INTRAVENOUS | Status: DC
  Administered 2020-06-24: 04:00:00 25 ug/h via INTRAVENOUS
  Filled 2020-06-24: qty 250

## 2020-06-24 MED ORDER — FENTANYL BOLUS VIA INFUSION
25.0000 ug | INTRAVENOUS | Status: DC | PRN
  Filled 2020-06-24: qty 100

## 2020-06-24 MED ORDER — PROSOURCE TF PO LIQD
45.0000 mL | Freq: Two times a day (BID) | ORAL | Status: DC
  Administered 2020-06-24: 10:00:00 45 mL

## 2020-06-24 MED ORDER — POLYETHYLENE GLYCOL 3350 17 G PO PACK
17.0000 g | PACK | Freq: Every day | ORAL | Status: DC
  Administered 2020-06-24 – 2020-06-25 (×2): 17 g
  Filled 2020-06-24 (×2): qty 1

## 2020-06-24 MED ORDER — ASPIRIN 81 MG PO CHEW
81.0000 mg | CHEWABLE_TABLET | Freq: Every day | ORAL | Status: DC
  Administered 2020-06-24 – 2020-06-25 (×2): 81 mg
  Filled 2020-06-24 (×2): qty 1

## 2020-06-24 MED ORDER — QUETIAPINE FUMARATE 25 MG PO TABS
25.0000 mg | ORAL_TABLET | Freq: Two times a day (BID) | ORAL | Status: DC
  Administered 2020-06-24 – 2020-06-25 (×3): 25 mg
  Filled 2020-06-24 (×3): qty 1

## 2020-06-24 MED ORDER — FENTANYL CITRATE (PF) 100 MCG/2ML IJ SOLN: 25.0000 ug | INTRAMUSCULAR | Status: DC | PRN

## 2020-06-24 MED ORDER — CHLORHEXIDINE GLUCONATE 0.12% ORAL RINSE (MEDLINE KIT)
15.0000 mL | Freq: Two times a day (BID) | OROMUCOSAL | Status: DC
  Administered 2020-06-24 – 2020-06-25 (×3): 15 mL via OROMUCOSAL

## 2020-06-24 MED ORDER — DEXMEDETOMIDINE HCL IN NACL 400 MCG/100ML IV SOLN
0.0000 ug/kg/h | INTRAVENOUS | Status: DC
  Administered 2020-06-24: 04:00:00 0.4 ug/kg/h via INTRAVENOUS
  Administered 2020-06-24: 18:00:00 0.2 ug/kg/h via INTRAVENOUS
  Filled 2020-06-24 (×3): qty 100

## 2020-06-24 MED ORDER — ORAL CARE MOUTH RINSE
15.0000 mL | OROMUCOSAL | Status: DC
  Administered 2020-06-24 – 2020-06-25 (×12): 15 mL via OROMUCOSAL

## 2020-06-24 MED ORDER — SUCCINYLCHOLINE CHLORIDE 20 MG/ML IJ SOLN
INTRAMUSCULAR | Status: DC | PRN
  Administered 2020-06-24: 120 mg via INTRAVENOUS

## 2020-06-24 MED ORDER — VITAL AF 1.2 CAL PO LIQD
1000.0000 mL | ORAL | Status: DC
  Administered 2020-06-24: 16:00:00 1000 mL

## 2020-06-24 MED ORDER — ACETAMINOPHEN 160 MG/5ML PO SOLN
650.0000 mg | ORAL | Status: DC | PRN
  Administered 2020-06-24: 650 mg
  Filled 2020-06-24: qty 20.3

## 2020-06-24 MED ORDER — ROSUVASTATIN CALCIUM 20 MG PO TABS
40.0000 mg | ORAL_TABLET | Freq: Every day | ORAL | Status: DC
  Administered 2020-06-24 – 2020-06-25 (×2): 40 mg
  Filled 2020-06-24 (×2): qty 2

## 2020-06-24 MED ORDER — ALBUTEROL SULFATE (2.5 MG/3ML) 0.083% IN NEBU: 2.5000 mg | INHALATION_SOLUTION | RESPIRATORY_TRACT | Status: DC | PRN

## 2020-06-24 MED ORDER — VITAL HIGH PROTEIN PO LIQD: 1000.0000 mL | ORAL | Status: DC

## 2020-06-24 MED ORDER — DEXTROSE 50 % IV SOLN
INTRAVENOUS | Status: AC
  Administered 2020-06-24: 13:00:00 50 mL
  Filled 2020-06-24: qty 50

## 2020-06-24 MED ORDER — FENTANYL CITRATE (PF) 100 MCG/2ML IJ SOLN
25.0000 ug | INTRAMUSCULAR | Status: DC | PRN
Start: 2020-06-24 — End: 2020-06-24
  Administered 2020-06-24: 100 ug via INTRAVENOUS

## 2020-06-24 MED ORDER — VITAL HIGH PROTEIN PO LIQD
1000.0000 mL | ORAL | Status: DC
  Administered 2020-06-24: 12:00:00 1000 mL
  Filled 2020-06-24: qty 1000

## 2020-06-24 MED ORDER — NOREPINEPHRINE 16 MG/250ML-% IV SOLN
0.0000 ug/min | INTRAVENOUS | Status: DC
  Filled 2020-06-24: qty 250

## 2020-06-24 MED ORDER — IPRATROPIUM-ALBUTEROL 0.5-2.5 (3) MG/3ML IN SOLN
3.0000 mL | Freq: Four times a day (QID) | RESPIRATORY_TRACT | Status: DC
  Administered 2020-06-24 – 2020-06-25 (×3): 3 mL via RESPIRATORY_TRACT
  Filled 2020-06-24 (×4): qty 3

## 2020-06-24 MED ORDER — PANTOPRAZOLE SODIUM 40 MG PO PACK
40.0000 mg | PACK | Freq: Every day | ORAL | Status: DC
  Administered 2020-06-24 – 2020-06-25 (×2): 40 mg
  Filled 2020-06-24 (×2): qty 20

## 2020-06-24 MED ORDER — LEVETIRACETAM 100 MG/ML PO SOLN
500.0000 mg | Freq: Two times a day (BID) | ORAL | Status: DC
  Administered 2020-06-24 – 2020-06-25 (×3): 500 mg
  Filled 2020-06-24 (×3): qty 5

## 2020-06-24 MED ORDER — FENTANYL CITRATE (PF) 100 MCG/2ML IJ SOLN
INTRAMUSCULAR | Status: AC
  Administered 2020-06-24: 04:00:00 100 ug
  Filled 2020-06-24: qty 2

## 2020-06-24 MED ORDER — NOREPINEPHRINE 4 MG/250ML-% IV SOLN
0.0000 ug/min | INTRAVENOUS | Status: DC
  Administered 2020-06-24: 20:00:00 11 ug/min via INTRAVENOUS
  Administered 2020-06-24: 13:00:00 2 ug/min via INTRAVENOUS
  Administered 2020-06-25: 02:00:00 10 ug/min via INTRAVENOUS
  Filled 2020-06-24 (×5): qty 250

## 2020-06-24 MED ORDER — ALLOPURINOL 100 MG PO TABS
100.0000 mg | ORAL_TABLET | Freq: Every day | ORAL | Status: DC
  Administered 2020-06-24 – 2020-06-25 (×2): 100 mg
  Filled 2020-06-24 (×2): qty 1

## 2020-06-24 MED ORDER — ASPIRIN 81 MG PO CHEW
162.0000 mg | CHEWABLE_TABLET | Freq: Once | ORAL | Status: AC
  Administered 2020-06-24: 06:00:00 162 mg
  Filled 2020-06-24: qty 2

## 2020-06-24 MED ORDER — FUROSEMIDE 10 MG/ML IJ SOLN
60.0000 mg | Freq: Once | INTRAMUSCULAR | Status: AC
  Administered 2020-06-24: 10:00:00 60 mg via INTRAVENOUS
  Filled 2020-06-24: qty 6

## 2020-06-24 MED ORDER — DOCUSATE SODIUM 50 MG/5ML PO LIQD
100.0000 mg | Freq: Two times a day (BID) | ORAL | Status: DC
  Administered 2020-06-24 – 2020-06-25 (×3): 100 mg
  Filled 2020-06-24 (×3): qty 10

## 2020-06-24 MED ORDER — PROSOURCE TF PO LIQD
45.0000 mL | Freq: Two times a day (BID) | ORAL | Status: DC
  Filled 2020-06-24: qty 45

## 2020-06-24 MED ORDER — EPTIFIBATIDE 75 MG/100ML IV SOLN
1.0000 ug/kg/min | INTRAVENOUS | Status: AC
  Administered 2020-06-24 (×2): 1 ug/kg/min via INTRAVENOUS
  Filled 2020-06-24 (×3): qty 100

## 2020-06-24 MED ORDER — PROSOURCE TF PO LIQD
45.0000 mL | Freq: Every day | ORAL | Status: DC
  Administered 2020-06-25: 11:00:00 45 mL
  Filled 2020-06-24: qty 45

## 2020-06-24 MED ORDER — ASPIRIN 81 MG PO CHEW: 81.0000 mg | CHEWABLE_TABLET | Freq: Every day | ORAL | Status: DC

## 2020-06-24 MED ORDER — FENTANYL CITRATE (PF) 100 MCG/2ML IJ SOLN: 25.0000 ug | Freq: Once | INTRAMUSCULAR | Status: DC

## 2020-06-24 MED ORDER — ETOMIDATE 2 MG/ML IV SOLN
INTRAVENOUS | Status: DC | PRN
  Administered 2020-06-24: 12 mg via INTRAVENOUS

## 2020-06-24 MED ORDER — FUROSEMIDE 10 MG/ML IJ SOLN
INTRAMUSCULAR | Status: AC
  Administered 2020-06-24: 04:00:00 80 mg
  Filled 2020-06-24: qty 8

## 2020-06-24 MED ORDER — EPTIFIBATIDE BOLUS VIA INFUSION
180.0000 ug/kg | Freq: Once | INTRAVENOUS | Status: AC
  Administered 2020-06-24: 06:00:00 13300 ug via INTRAVENOUS
  Filled 2020-06-24: qty 18

## 2020-06-24 MED ORDER — DONEPEZIL HCL 10 MG PO TABS
10.0000 mg | ORAL_TABLET | Freq: Every day | ORAL | Status: DC
  Administered 2020-06-24: 22:00:00 10 mg
  Filled 2020-06-24 (×2): qty 1

## 2020-06-24 MED ORDER — FENTANYL BOLUS VIA INFUSION
25.0000 ug | INTRAVENOUS | Status: DC | PRN
  Filled 2020-06-24: qty 25

## 2020-06-24 NOTE — Progress Notes (Signed)
   06/24/20 0300  Clinical Encounter Type  Visited With Patient;Health care provider  Visit Type Code  Referral From Nurse  Consult/Referral To Chaplain  The chaplain responded to code blue. The patient was stabilized and will be sent to [id number]. The patient may eventually go to [place]. There is no family present. The chaplain will update morning chaplain of 23M in case the family needs support. The chaplain will follow up as needed.

## 2020-06-24 NOTE — Progress Notes (Signed)
C/o continuing nausea that was unrelieved by Zofran per order earlier. Assessment findings, nausea and dry heaving w/ abdominal pain, ST HR 115-140s, Sats 90's on 2L [place]. Skin cold/clammy. Lethargic and becoming hypotensive w/ BP 54/40. EKG performed.  Called MET team due to becoming symptomatic. While on the phone with MET team RN, Mindy, he became unresponsive w/ agonal breathing.Code Blue called. See VS trends in flowsheet. NRB placed on him initially and proceeded to decline and no response to painful stimuli but pulses present. Afibb reported by central monitoring during this event . Dr Moshe Cipro arrived to assess, Anesthesia, chaplain, RT, Charge nurse,  and rapid response team at bedside. Intubated and transferred to 35M-09, report given to [place], Therapist, sports at bedside once transferred. MD to notify wife of changes.

## 2020-06-24 NOTE — Anesthesia Procedure Notes (Signed)
Procedure Name: Intubation Date/Time: 06/24/2020 3:02 AM Performed by: Jearld Pies, CRNA Pre-anesthesia Checklist: Patient identified, Emergency Drugs available, Suction available and Patient being monitored Patient Re-evaluated:Patient Re-evaluated prior to induction Oxygen Delivery Method: Ambu bag Preoxygenation: Pre-oxygenation with 100% oxygen Induction Type: IV induction, Rapid sequence and Cricoid Pressure applied Laryngoscope Size: Glidescope and 3 Grade View: Grade I Tube type: Oral Tube size: 7.5 mm Number of attempts: 1 Airway Equipment and Method: Stylet and Video-laryngoscopy Placement Confirmation: ETT inserted through vocal cords under direct vision,  breath sounds checked- equal and bilateral and CO2 detector Secured at: 23 cm Tube secured with: ICU securement device. Dental Injury: Teeth and Oropharynx as per pre-operative assessment  Comments: Upon arrival to [facility] ICU, noted patient to be mask ventilated via AMBU bag by RN, low SpO2 saturations, high HR (130s) and low BP trends, uneventful intubation and transfer of care post-intubation to ICU team and rapid response RN (Mindy). Rt secured ETT with ICU securement device.

## 2020-06-24 NOTE — Progress Notes (Signed)
ANTICOAGULATION CONSULT NOTE  Pharmacy Consult for Heparin  Indication: LV thrombus  No Known Allergies  Patient Measurements: Height: 5\' 9"  (175.3 cm) Weight: 73.9 kg (162 lb 14.7 oz) IBW/kg (Calculated) : 70.7 Heparin Dosing Weight: 73 kg  Vital Signs: Temp: 98.7 F (37.1 C) (12/15 0730) Temp Source: Axillary (12/15 0730) BP: 80/67 (12/15 0815) Pulse Rate: 102 (12/15 0815)  Labs: Recent Labs    06/23/20 0400 06/23/20 0426 06/23/20 1252 06/24/20 0032 06/24/20 0044 06/24/20 0315 06/24/20 0438 06/24/20 0629 06/24/20 0855  HGB 9.3*  --   --   --  9.5*  --  8.8*  --  9.7*  HCT 28.4*  --   --   --  28.8*  --  26.0*  --  30.2*  PLT 149*  --   --   --  161  --   --   --  173  HEPARINUNFRC  --    < > 0.40 0.30  --   --   --   --  0.55  CREATININE 1.71*  --   --   --  1.79* 2.11*  --   --  2.31*  TROPONINIHS  --   --   --   --   --  16,629*  --  17,343*  --    < > = values in this interval not displayed.    Estimated Creatinine Clearance: 23 mL/min (A) (by C-G formula based on SCr of 2.31 mg/dL (H)).   Assessment: 86 year old male presented to [facility] for diagnostic cath for ongoing angina and was found to have multivessel disease; awaiting surgical consultation.   Echo showed low EF and LV thrombus  Hep lvl this am within goal range  Cbc stable  Also on Integrilin x 24 hrs  Goal of Therapy:  Heparin level 0.3-0.7 units/ml Monitor platelets by anticoagulation protocol: Yes   Plan:  Continue heparin 1050 units/hr Daily hep lvl cbc  Barth Kirks, PharmD, BCPS, BCCCP Clinical Pharmacist [phone]  Please check AMION for all [place] numbers  06/24/2020 9:51 AM

## 2020-06-24 NOTE — Progress Notes (Signed)
1 amp D50 given per ordered standing  Hypoglycemic protocol for CBG 67. Will recheck CBG in 24mins.

## 2020-06-24 NOTE — Progress Notes (Signed)
[facility] Progress Note Patient Name: Isaiah Davidson DOB: [birth year] MRN: [id number]   Date of Service  06/24/2020  HPI/Events of Note  Troponin = 16,629. Patient is already on a Heparin IV infusion. Cardiology is following patient and feels PCI would be high risk. CV Surgery has not seen him yet. However, not clear that he would be a surgical candidate.   eICU Interventions  Continue medical management.      Intervention Category Major Interventions: Other:  Versie Soave Cornelia Copa 06/24/2020, 5:07 AM

## 2020-06-24 NOTE — Progress Notes (Signed)
PT Cancellation Note  Patient Details Name: Isaiah Davidson MRN: [id number] DOB: Dec 21, 1931   Cancelled Treatment:    Reason Eval/Treat Not Completed: Medical issues which prohibited therapy.  Pt re-intubated over night. RN recommends holding.  PT to follow along for medical stability.    Thanks,  Verdene Lennert, PT, DPT  Acute Rehabilitation [phone] pager #[phone] office       [name] 06/24/2020, 10:47 AM

## 2020-06-24 NOTE — Consult Note (Addendum)
NAME:  Isaiah Davidson, MRN:  [id number], DOB:  22-Sep-1931, LOS: 5 ADMISSION DATE:  06/24/2020, CONSULTATION DATE:  06/21/2020 REFERRING MD:  RRT CHIEF COMPLAINT: Acute respiratory failure  HPI/course in hospital  86 year old man who presented to his outpatient cardiologist with increasing anginal discomfort with associated dyspnea, CCS class III.  Found to have multivessel coronary disease amenable to surgical revascularization. Echocardiogram shows decreased ejection fraction at 25%, left ventricular hypertrophy and possible aortic stenosis with significant calcification of the aortic valve (low flow stenosis)  The patient lives independently.  Past medical history is significant for absence type seizures with his last seizure being 6 years ago and associated mild cognitive decline since then.  There has been no improvement despite control of seizures in the next addition of Aricept.  He finds he has issues with short-term memory but otherwise can still navigate tasks such as driving.  Last Mini-Mental status examination was 20/30 within the last year.  He has a remote smoking history, there are no occupational lung exposures.  There is no history of underlying lung disease  He has always been of slender build and his appetite has worsened over the last 2 years with significant decline over the last 2 months.  Was being optimized for CABG tentatively scheduled for 12/17 by HF service on dobutamine infusion.   Developed sudden respiratory distress, hypotension and unresponsiveness after also complaining of chest pain.   Intubated and transferred to ICU.  Similar episode had prompted ICU transfer earlier this admission without need for intubation.  Past Medical History   Past Medical History:  Diagnosis Date  . Heart murmur   . HYPERLIPIDEMIA 02/22/2007  . HYPERTENSION 02/22/2007  . MCI (mild cognitive impairment) with memory loss 05/14/2014  . Seizures ([place])    treated for  siezures around 2010     Past Surgical History:  Procedure Laterality Date  . CARPAL TUNNEL RELEASE Bilateral   . CHOLECYSTECTOMY N/A 06/15/2018   Procedure: LAPAROSCOPIC CHOLECYSTECTOMY;  Surgeon: Kinsinger, Arta Bruce, MD;  Location: [facility];  Service: General;  Laterality: N/A;  . CORONARY ANGIOGRAPHY N/A 06/23/2020   Procedure: CORONARY ANGIOGRAPHY;  Surgeon: Troy Sine, MD;  Location: [facility];  Service: Cardiovascular;  Laterality: N/A;  . KNEE ARTHROSCOPY Right     Interim history/subjective:  Intubated.   Objective   Blood pressure (!) 84/69, pulse (!) 104, temperature 98.3 F (36.8 C), temperature source Oral, resp. rate (!) 22, height 5\' 9"  (1.753 m), weight 74 kg, SpO2 100 %. CVP:  [6 mmHg-8 mmHg] 8 mmHg      Intake/Output Summary (Last 24 hours) at 06/24/2020 0358 Last data filed at 06/24/2020 0100 Gross per 24 hour  Intake 588.41 ml  Output --  Net 588.41 ml   Filed Weights   06/29/2020 0958 06/22/20 0400 06/23/20 0520  Weight: 73.3 kg 71.5 kg 74 kg    Examination:  Physical Exam Constitutional:      Appearance: Normal appearance. He is underweight.     Comments: Mild muscle wasting  Eyes:     Conjunctiva/sclera: Conjunctivae normal.  Neck:     Thyroid: No thyromegaly.     Vascular: No carotid bruit or JVD.  Cardiovascular:     Rate and Rhythm: Normal rate and regular rhythm.     Pulses: Normal pulses.     Heart sounds: S1 normal and S2 normal.  Pulmonary:     Effort: mechanically ventilated with episodes of coughing.    Breath sounds: Diffuse  rhonchi.  Abdominal:     Palpations: Abdomen is soft.  Musculoskeletal:     Right lower leg: No edema.     Left lower leg: No edema.  Neurological:     Mental Status: sedated Following commands with no focal deficits.     Ancillary tests (personally reviewed)  CBC: Recent Labs  Lab 06/21/20 0104 06/22/20 0426 06/22/20 1005 06/23/20 0400 06/24/20 0044  WBC 6.2 8.3 8.1 7.3 6.8  HGB  11.8* 9.8* 10.3* 9.3* 9.5*  HCT 34.4* 29.2* 29.4* 28.4* 28.8*  MCV 90.8 92.4 92.2 93.4 92.6  PLT 187 156 152 149* 638    Basic Metabolic Panel: Recent Labs  Lab 06/20/20 0119 06/20/20 0612 06/21/20 0104 06/22/20 0426 06/23/20 0400 06/24/20 0044  NA 138  --  136 135 135 133*  K 4.5  --  5.2* 4.0 3.7 3.8  CL 104  --  102 103 104 104  CO2 20*  --  19* 22 24 21*  GLUCOSE 145*  --  124* 114* 103* 132*  BUN 16  --  40* 41* 36* 35*  CREATININE 1.84*  --  2.79* 1.97* 1.71* 1.79*  CALCIUM 9.1  --  8.9 8.1* 8.2* 8.1*  MG  --  2.0  --   --   --   --    GFR: Estimated Creatinine Clearance: 29.6 mL/min (A) (by C-G formula based on SCr of 1.79 mg/dL (H)). Recent Labs  Lab 06/20/20 0119 06/21/20 0104 06/22/20 0426 06/22/20 1005 06/22/20 1220 06/23/20 0400 06/23/20 0858 06/24/20 0044  WBC 10.1   < > 8.3 8.1  --  7.3  --  6.8  LATICACIDVEN 4.5*  --   --  2.5* 2.2*  --  1.3  --    < > = values in this interval not displayed.    Liver Function Tests: No results for input(s): AST, ALT, ALKPHOS, BILITOT, PROT, ALBUMIN in the last 168 hours. No results for input(s): LIPASE, AMYLASE in the last 168 hours. No results for input(s): AMMONIA in the last 168 hours.  ABG    Component Value Date/Time   TCO2 22 06/14/2018 1812   O2SAT 88.5 06/23/2020 0400     Coagulation Profile: No results for input(s): INR, PROTIME in the last 168 hours.  Cardiac Enzymes: No results for input(s): CKTOTAL, CKMB, CKMBINDEX, TROPONINI in the last 168 hours.  HbA1C: Hgb A1c MFr Bld  Date/Time Value Ref Range Status  02/23/2009 12:20 PM  4.6 - 6.1 % Final   5.6 (NOTE) The ADA recommends the following therapeutic goal for glycemic control related to Hgb A1c measurement: Goal of therapy: <6.5 Hgb A1c  Reference: American Diabetes Association: Clinical Practice Recommendations 2010, Diabetes Care, 2010, 33: (Suppl  1).    CBG: Recent Labs  Lab 06/24/20 0323  GLUCAP 89   Limited echo 12/13: EF  35% on dobutamine, mild AS only.   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   Assessment & Plan:   Critically ill due to acute hypoxic respiratory failure requiring mechanical ventilation due to ischemic pulmonary edema.  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 Mild cognitive impairment Seizure disorder, absence type, well controlled Moderate nutritional risk Generalized frailty AKI due to contrast nephropathy versus low flow state.  Plan:  - full ventilatory support - continue dobutamine to keep MAP>65, ScvO2>60 - diuresis, follow CVP - continue pre-operative evaluation.  - at high delirium risk.   Patient would be very  high risk for cardiac surgery and LV function would not be expected to completely improve given concurrent cardiac amyloidosis.  Risk of further cognitive deterioration is also a significant possibility as would be the risk of postoperative hemodialysis. High nutritional risk compounds surgical risk    Daily Goals Checklist  Pain/Anxiety/Delirium protocol (if indicated): Precedex and fentanyl, seroquel to RASS -1 VAP protocol (if indicated): bundle in place. Respiratory support goals: full support  Blood pressure target: titrate dobutamine for MAP>65 and ScvO2>60 DVT prophylaxis: on heparin infusion Nutrition Status: high nutritional risk.  GI prophylaxis: pantoprazole Fluid status goals: diuresis. Urinary catheter: Assessment of intravascular volume Central lines: left subclavian TLC Glucose control: SSI  Mobility/therapy needs: bedrest Antibiotic de-escalation: on no antibiotics.  Home medication reconciliation: reordered  Daily labs: CBC, BMP, [place] Code Status: full  Family Communication: will update family  Disposition: ICU   CRITICAL CARE Performed by: Kipp Brood   Total critical care time: 40 minutes  Critical care time was exclusive of  separately billable procedures and treating other patients.  Critical care was necessary to treat or prevent imminent or life-threatening deterioration.  Critical care was time spent personally by me on the following activities: development of treatment plan with patient and/or surrogate as well as nursing, discussions with consultants, evaluation of patient's response to treatment, examination of patient, obtaining history from patient or surrogate, ordering and performing treatments and interventions, ordering and review of laboratory studies, ordering and review of radiographic studies, pulse oximetry, re-evaluation of patient's condition and participation in multidisciplinary rounds.  Kipp Brood, MD [facility] ICU Physician [place]  Pager: [phone] Mobile: [phone] After hours: [phone].    06/24/2020, 3:58 AM

## 2020-06-24 NOTE — Progress Notes (Signed)
Cortrak Tube Team Note:  Consult received to place a Cortrak feeding tube.   Spoke with RN who reports that pt is now a DNR and plan is hold on cortrak placement for now. Service available on Friday if cortrak placement needed.   Lockie Pares., RD, LDN, CNSC See AMiON for contact information

## 2020-06-24 NOTE — Evaluation (Signed)
Occupational Therapy Evaluation Patient Details Name: Isaiah Davidson MRN: [id number] DOB: [birth year] Today's Date: 06/24/2020    History of Present Illness 86 y.o. male with a hx of HTN, HLD, Aortic Atherosclerosis, Carpal Tunnel Syndrome, Mild Cognitive Impairment on Donepezil, seizures and memory disturbances. Presented 12/10 to Cardiac Cath Lab for Left heart catheterization and coronary angiography.  After which he became hypotenside, diaphoretic and with c/o of 10/10 chest/ABD pain. Found to be in cardiogenic shock with new low LVEF, worsening kidney failure and elevated lactic acied and LDH. Non-tunneled central venous catheter placed in L subclavian vein 12/12. Pt re-intubated 12/15.   Clinical Impression   Pt PTA: Pt was independent with ADL and mobility. Pt currently, limited by re-intubation and decreased level of arousal. ROM, MMT and very little command follow performed today.  Pt totalA for bed mobility and ADL at this time until pt more aroused. Pt would benefit from continued OT skilled services. OT following acutely. O2 100% on 5 PEEP FiO2 40% vented; HR 101 BPM, 91/62 BP.   Pt would benefit from continued OT skilled services. OT following acutely. *OT to follow for palliative consult.     Follow Up Recommendations  SNF;Supervision/Assistance - 24 hour    Equipment Recommendations  3 in 1 bedside commode    Recommendations for Other Services       Precautions / Restrictions Precautions Precautions: Fall;Other (comment) Precaution Comments: intubation Restrictions Weight Bearing Restrictions: No      Mobility Bed Mobility Overal bed mobility: Needs Assistance             General bed mobility comments: totalA; pt unable to attempt at this time as pt highly sedated    Transfers                 General transfer comment: DNT    Balance                                           ADL either performed or assessed with  clinical judgement   ADL Overall ADL's : Needs assistance/impaired Eating/Feeding: NPO   Grooming: Total assistance   Upper Body Bathing: Total assistance   Lower Body Bathing: Total assistance   Upper Body Dressing : Total assistance   Lower Body Dressing: Total assistance   Toilet Transfer: Total assistance   Toileting- Clothing Manipulation and Hygiene: Total assistance       Functional mobility during ADLs: Total assistance;+2 for physical assistance;+2 for safety/equipment General ADL Comments: Pt limited by re-intubation and decreased level of arousal. ROM, MMT and very little command follow performed today.     Vision Baseline Vision/History: Wears glasses Patient Visual Report: No change from baseline Vision Assessment?: No apparent visual deficits     Perception     Praxis      Pertinent Vitals/Pain Pain Assessment: Faces Faces Pain Scale: Hurts a little bit Pain Location: generalized Pain Descriptors / Indicators: Discomfort Pain Intervention(s): Monitored during session;Repositioned     Hand Dominance Right   Extremity/Trunk Assessment Upper Extremity Assessment Upper Extremity Assessment: Generalized weakness;RUE deficits/detail;LUE deficits/detail RUE Deficits / Details: 3+/5 MM grade strength shoulder through digits LUE Deficits / Details: 3+/5 MM grade strength shoulder through digits   Lower Extremity Assessment Lower Extremity Assessment: Defer to PT evaluation   Cervical / Trunk Assessment Cervical / Trunk Assessment: Kyphotic   Communication Communication Communication: Other (comment) (  intubated)   Cognition Arousal/Alertness: Lethargic Behavior During Therapy: Flat affect Overall Cognitive Status: Difficult to assess                                 General Comments: Pt intubated and following ~50% of commands with cues each time after hearing his name.   General Comments  O2 100% on 5 PEEP FiO2 40% vented; HR 101  BPM, 91/62 BP.    Exercises     Shoulder Instructions      Home Living Family/patient expects to be discharged to:: Private residence Living Arrangements: Spouse/significant other Available Help at Discharge: Family;Available 24 hours/day Type of Home: House Home Access: Stairs to enter     Home Layout: One level     Bathroom Shower/Tub: Teacher, early years/pre: Handicapped height Bathroom Accessibility: Yes   Home Equipment: None;Cane - single point          Prior Functioning/Environment Level of Independence: Independent        Comments: able to walk limited community distances, sometimes with increased fatigue        OT Problem List: Decreased range of motion;Decreased strength;Decreased activity tolerance;Impaired balance (sitting and/or standing);Decreased cognition;Decreased safety awareness;Cardiopulmonary status limiting activity;Pain;Increased edema      OT Treatment/Interventions:      OT Goals(Current goals can be found in the care plan section) Acute Rehab OT Goals Patient Stated Goal: go home to be with wife OT Goal Formulation: Patient unable to participate in goal setting Time For Goal Achievement: 07/08/20 Potential to Achieve Goals: Poor ADL Goals Pt Will Perform Eating: with min guard assist;with adaptive utensils;bed level Pt Will Perform Grooming: with min guard assist;bed level Pt/caregiver will Perform Home Exercise Program: Increased strength;Both right and left upper extremity;With written HEP provided;With minimal assist Additional ADL Goal #1: Pt will increase to modA for rolling side to side to increase activity tolerance. Additional ADL Goal #2: Pt will participate in low level ADL task x2 mins to increase endurance for ADL/mobility.  OT Frequency: Min 2X/week   Barriers to D/C: Decreased caregiver support  decreased caregiver physical assist       Co-evaluation              AM-PAC OT "6 Clicks" Daily Activity      Outcome Measure Help from another person eating meals?: Total Help from another person taking care of personal grooming?: Total Help from another person toileting, which includes using toliet, bedpan, or urinal?: Total Help from another person bathing (including washing, rinsing, drying)?: Total Help from another person to put on and taking off regular upper body clothing?: Total Help from another person to put on and taking off regular lower body clothing?: Total 6 Click Score: 6   End of Session Equipment Utilized During Treatment: Oxygen Nurse Communication: Mobility status  Activity Tolerance: Patient limited by fatigue;Patient limited by lethargy;Treatment limited secondary to medical complications (Comment) Patient left: in bed;with call bell/phone within reach;with bed alarm set;with nursing/sitter in room;with restraints reapplied  OT Visit Diagnosis: Muscle weakness (generalized) (M62.81);Pain Pain - part of body:  (generalized)                Time: 1022-1040 OT Time Calculation (min): 18 min Charges:  OT General Charges $OT Visit: 1 Visit OT Evaluation $OT Eval Moderate Complexity: 1 Mod  Jefferey Pica, OTR/L Acute Rehabilitation Services Pager: [phone] Office: [phone]   Carlen Fils C 06/24/2020, 5:40  PM

## 2020-06-24 NOTE — Progress Notes (Signed)
CRITICAL VALUE ALERT  Critical Value:  Troponin 94585   Date & Time Notied:  06/24/20 0500  Provider Notified: Boone Master   Orders Received/Actions taken: No new orders received

## 2020-06-24 NOTE — Significant Event (Signed)
Rapid Response Event Note   Reason for Call :  Pt hypotensive, diaphoretic, and tachycardic.  Initial Focused Assessment:  Pt laying in bed with eyes closed, agonally breathing. HR-123s, BP-54/40, SpO2-96% on RA. Code Blue initiated for respiratory arrest-NS started at 999 and pt bagged until anesthesia arrived. Pt was then intubated and moved to 2M09. Chest compressions/meds not necessary-pt never lost pulse.     Interventions:  BVM on 100% oxygen Code Blue called Pt intubated by anesthesia Tx to [place] of Care:  Tx to 2M09   Event Summary:   MD Notified: Dr. Dow Adolph team MD) notified cards fellow and called PCCM Call [place] Time:0330  Dillard Essex, RN

## 2020-06-24 NOTE — Progress Notes (Signed)
Reported to CCM during board rounds that there has been no urine output from Lasix 60mg  IV. No new orders

## 2020-06-24 NOTE — Progress Notes (Signed)
Patient ID: Isaiah Davidson, male   DOB: 07-15-1931, 86 y.o.   MRN: [id number]     Advanced Heart Failure Rounding Note  PCP-Cardiologist: No primary care provider on file.   Subjective:    Last night developed respiratory distress, hypotension and was intubated. Atrial fibrillation documented in notes but no ECG with this and in NSR today.  HS-TnI [id number] => [id number].  Intregrilin started overnight in addition to heparin gtt.   This morning, he is on dobutamine 2.5 and off NE.  MAP around 70.  Lactate 1.8, co-ox 64%.  CVP 11-12. Creatinine up to 2.11. CXR with new LLL infiltrate.   Limited echo 12/14 on dobutamine: EF 35-40% with mild aortic stenosis (mean gradient 10 mmHg) and no LV thrombus visualized.   Objective:   Weight Range: 73.9 kg Body mass index is 24.06 kg/m.   Vital Signs:   Temp:  [97.6 F (36.4 C)-98.7 F (37.1 C)] 98.7 F (37.1 C) (12/15 0730) Pulse Rate:  [84-193] 102 (12/15 0815) Resp:  [18-40] 20 (12/15 0815) BP: (80-130)/(61-88) 80/67 (12/15 0815) SpO2:  [90 %-100 %] 91 % (12/15 0815) FiO2 (%):  [40 %-100 %] 40 % (12/15 0758) Weight:  [73.9 kg] 73.9 kg (12/15 0413) Last BM Date: 06/23/20  Weight change: Filed Weights   06/22/20 0400 06/23/20 0520 06/24/20 0413  Weight: 71.5 kg 74 kg 73.9 kg    Intake/Output:   Intake/Output Summary (Last 24 hours) at 06/24/2020 0905 Last data filed at 06/24/2020 0800 Gross per 24 hour  Intake 650.58 ml  Output 490 ml  Net 160.58 ml      Physical Exam    General: Intubated/sedated Neck: JVP 10 cm, no thyromegaly or thyroid nodule.  Lungs: Crackles at bases.  CV: Nondisplaced PMI.  Heart regular S1/S2, no S3/S4, 2/6 SEM RUSB with clear S2.  No peripheral edema.  Abdomen: Soft, nontender, no hepatosplenomegaly, no distention.  Skin: Intact without lesions or rashes.  Neurologic: Alert and oriented x 3.  Psych: Normal affect. Extremities: No clubbing or cyanosis.  HEENT: Normal.    Telemetry   NSR 70s  (personally reviewed)   EKG    No new EKG to review   Labs    CBC Recent Labs    06/23/20 0400 06/24/20 0044 06/24/20 0438  WBC 7.3 6.8  --   HGB 9.3* 9.5* 8.8*  HCT 28.4* 28.8* 26.0*  MCV 93.4 92.6  --   PLT 149* 161  --    Basic Metabolic Panel Recent Labs    06/24/20 0044 06/24/20 0315 06/24/20 0438  NA 133* 135 135  K 3.8 4.0 3.5  CL 104 102  --   CO2 21* 19*  --   GLUCOSE 132* 211*  --   BUN 35* 38*  --   CREATININE 1.79* 2.11*  --   CALCIUM 8.1* 7.9*  --    Liver Function Tests No results for input(s): AST, ALT, ALKPHOS, BILITOT, PROT, ALBUMIN in the last 72 hours. No results for input(s): LIPASE, AMYLASE in the last 72 hours. Cardiac Enzymes No results for input(s): CKTOTAL, CKMB, CKMBINDEX, TROPONINI in the last 72 hours.  BNP: BNP (last 3 results) No results for input(s): BNP in the last 8760 hours.  ProBNP (last 3 results) No results for input(s): PROBNP in the last 8760 hours.   D-Dimer No results for input(s): DDIMER in the last 72 hours. Hemoglobin A1C No results for input(s): HGBA1C in the last 72 hours. Fasting Lipid Panel No results for  input(s): CHOL, HDL, LDLCALC, TRIG, CHOLHDL, LDLDIRECT in the last 72 hours. Thyroid Function Tests No results for input(s): TSH, T4TOTAL, T3FREE, THYROIDAB in the last 72 hours.  Invalid input(s): FREET3  Other results:   Imaging    NM CARDIAC AMYLOID TUMOR LOC INFLAM SPECT 1 DAY  Result Date: 06/23/2020 CLINICAL DATA:  Heart failure, hypertension EXAM: NUCLEAR MEDICINE TUMOR LOCALIZATION. PYP CARDIAC AMYLOIDOSIS SCAN WITH SPECT TECHNIQUE: Following intravenous administration of radiopharmaceutical, anterior planar images of the chest were obtained. Regions of interest were placed on the heart and contralateral chest wall for quantitative assessment. Additional SPECT imaging of the chest was obtained. RADIOPHARMACEUTICALS:  21.8 mCi TECHNETIUM 99 PYROPHOSPHATE FINDINGS: Planar Visual assessment:  Anterior planar imaging demonstrates radiotracer uptake within the heart greater than uptake within the adjacent ribs (Grade 3). Quantitative assessment : Quantitative assessment of the cardiac uptake compared to the contralateral chest wall is equal to 2.17 (H/CL = 2.17). SPECT assessment: SPECT imaging of the chest demonstrates clear radiotracer accumulation within the LEFT ventricle. IMPRESSION: Visual and quantitative assessment (grade 3, H/CL = 2.17) are strongly suggestive of transthyretin amyloidosis. Electronically Signed   By: Lavonia Dana M.D.   On: 06/23/2020 13:49   DG CHEST PORT 1 VIEW  Result Date: 06/24/2020 CLINICAL DATA:  Shortness of breath EXAM: PORTABLE CHEST 1 VIEW COMPARISON:  06/21/2020 FINDINGS: Endotracheal tube 7 cm above the carina. Left central line is unchanged. Left lower lobe atelectasis or infiltrate. Possible small left effusion. No confluent opacity on the right. Heart is normal size. IMPRESSION: Endotracheal tube 7 cm above the carina. New left lower lobe atelectasis or infiltrate with small left effusion. Electronically Signed   By: Rolm Baptise M.D.   On: 06/24/2020 03:47   DG Abd Portable 1V  Result Date: 06/24/2020 CLINICAL DATA:  OG tube placement EXAM: PORTABLE ABDOMEN - 1 VIEW COMPARISON:  None. FINDINGS: OG tube tip is in the stomach. Nonobstructed bowel gas pattern. No free air. IMPRESSION: OG tube tip in the stomach. Electronically Signed   By: Rolm Baptise M.D.   On: 06/24/2020 03:58     Medications:     Scheduled Medications:  allopurinol  100 mg Per Tube Daily   aspirin  81 mg Per Tube Daily   chlorhexidine gluconate (MEDLINE KIT)  15 mL Mouth Rinse BID   Chlorhexidine Gluconate Cloth  6 each Topical Daily   docusate  100 mg Per Tube BID   donepezil  10 mg Per Tube QHS   feeding supplement (PROSource TF)  45 mL Per Tube BID   feeding supplement (PROSource TF)  45 mL Per Tube BID   feeding supplement (VITAL HIGH PROTEIN)  1,000 mL Per  Tube Q24H   feeding supplement (VITAL HIGH PROTEIN)  1,000 mL Per Tube Q24H   fentaNYL (SUBLIMAZE) injection  25 mcg Intravenous Once   influenza vaccine adjuvanted  0.5 mL Intramuscular Tomorrow-1000   insulin aspart  2-6 Units Subcutaneous Q4H   levETIRAcetam  500 mg Per Tube BID   mouth rinse  15 mL Mouth Rinse 10 times per day   pantoprazole sodium  40 mg Per Tube Daily   polyethylene glycol  17 g Per Tube Daily   QUEtiapine  25 mg Per Tube BID   rosuvastatin  40 mg Per Tube Daily   sodium chloride flush  3 mL Intravenous Q12H    Infusions:  sodium chloride Stopped (06/21/20 2154)   sodium chloride Stopped (06/23/20 0416)   dexmedetomidine (PRECEDEX) IV infusion 0.4 mcg/kg/hr (  06/24/20 0800)   DOBUTamine 2.5 mcg/kg/min (06/24/20 0800)   eptifibatide 1 mcg/kg/min (06/24/20 0800)   fentaNYL infusion INTRAVENOUS 25 mcg/hr (06/24/20 0800)   heparin 1,050 Units/hr (06/24/20 0800)    PRN Medications: sodium chloride, acetaminophen (TYLENOL) oral liquid 160 mg/5 mL, fentaNYL, fentaNYL (SUBLIMAZE) injection, fentaNYL (SUBLIMAZE) injection, nitroGLYCERIN, ondansetron (ZOFRAN) IV, sodium chloride flush    Assessment/Plan   1. CAD: LHC done with angina and abnormal coronary CTA.  This showed 85% left main stenosis, 70% mRCA, 70% PDA, serial 70-80% mid-distal LCx, 90% D1, 50% mLAD.  Anatomy is best suited for CABG. Discussed films with Dr. Claiborne Billings, he does not have good PCI targets, would require very extensive atherectomy approaching multiple vessels including left main.  12/14-15 overnight respiratory arrest with hypotension, suspect he developed ischemic event with fall in BP and ischemic pulmonary edema.  [place] [address].  - Maintain MAP, currently stable on dobutamine alone.  - Continue ASA 81 and Crestor.  - Continue heparin gtt.  - Started on Integrilin, would continue 24 hrs then stop (discussed with CCM).  - Patient and wife were already reluctant for CABG  before respiratory arrest, suspect he would be very high risk even if extubated and re-stabilized (renal dysfunction, abnormal PFTs, CHF, age/frailty).  Suspect our best course here may be get him extubated and have palliative care evaluation.  2. LV thrombus: Smoke in LV apex and concern for thrombus on initial echo.  On repeat echo 12/14, no thrombus visualized.  - Continue heparin gtt for now.  3. Aortic stenosis: 12/14 echo on dobutamine suggestive of mild aortic stenosis.  4. Acute systolic CHF: Echo 18/34 on dobutamine 2.5 with EF 35-40%, mild AS.  Now s/p respiratory arrest and hypotension with troponin rise.  Co-ox 64% this morning with normal lactate, he remains on dobutamine 2.5.  Co-ox 11-12.  - Continue dobutamine 2.5.  - No ARB/ARNI/spironolactone/digoxin with AKI.  - No beta blocker with low output HF.  - Lasix 60 mg IV x 1 and follow response.   5. AKI on CKD stage 3: SCr bumped to 2.79 this admit. Contrast may have played a role as well as low output HF.  Baseline creatinine around 1.4. Creatinine back up to 2.1 today after hypotensive episode.  - Maintain MAP and CO.  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   7. Acute hypoxemic respiratory failure: Intubated.  Suspect initial ischemic pulmonary edema.  CXR with LLL infiltrate suggests possible aspiration.  - Gentle diuresis.  - Per CCM.  8. ?Atrial fibrillation: I cannot find definite documentation.  He is currently in NSR on heparin gtt.   CRITICAL CARE Performed by: Loralie Champagne  Total critical care time: 35 minutes  Critical care time was exclusive of separately billable procedures and treating other patients.  Critical care was necessary to treat or prevent imminent or life-threatening deterioration.  Critical care was time spent personally by me on the following activities: development of treatment plan with patient and/or surrogate as well as nursing, discussions with  consultants, evaluation of patient's response to treatment, examination of patient, obtaining history from patient or surrogate, ordering and performing treatments and interventions, ordering and review of laboratory studies, ordering and review of radiographic studies, pulse oximetry and re-evaluation of patient's condition.  Loralie Champagne 06/24/2020 9:05 AM

## 2020-06-24 NOTE — Progress Notes (Signed)
NAME:  Isaiah Davidson, MRN:  [id number], DOB:  06/30/1932, LOS: 5 ADMISSION DATE:  06/26/2020, CONSULTATION DATE:  06/21/2020 REFERRING MD:  RRT CHIEF COMPLAINT: Acute respiratory failure  HPI/course in hospital  86 year old man who presented to his outpatient cardiologist with increasing anginal discomfort with associated dyspnea, CCS class III.  Found to have multivessel coronary disease amenable to surgical revascularization. Echocardiogram shows decreased ejection fraction at 25%, left ventricular hypertrophy and possible aortic stenosis with significant calcification of the aortic valve (low flow stenosis)  The patient lives independently.  Past medical history is significant for absence type seizures with his last seizure being 6 years ago and associated mild cognitive decline since then.  There has been no improvement despite control of seizures in the next addition of Aricept.  He finds he has issues with short-term memory but otherwise can still navigate tasks such as driving.  Last Mini-Mental status examination was 20/30 within the last year.  He has a remote smoking history, there are no occupational lung exposures.  There is no history of underlying lung disease  He has always been of slender build and his appetite has worsened over the last 2 years with significant decline over the last 2 months.  Was being optimized for CABG tentatively scheduled for 12/17 by HF service on dobutamine infusion.   Developed sudden respiratory distress, hypotension and unresponsiveness after also complaining of chest pain.   Intubated and transferred to ICU.  Similar episode had prompted ICU transfer earlier this admission without need for intubation.  Past Medical History   Past Medical History:  Diagnosis Date  . Heart murmur   . HYPERLIPIDEMIA 02/22/2007  . HYPERTENSION 02/22/2007  . MCI (mild cognitive impairment) with memory loss 05/14/2014  . Seizures ([place])    treated for  siezures around 2010     Past Surgical History:  Procedure Laterality Date  . CARPAL TUNNEL RELEASE Bilateral   . CHOLECYSTECTOMY N/A 06/15/2018   Procedure: LAPAROSCOPIC CHOLECYSTECTOMY;  Surgeon: Kinsinger, Arta Bruce, MD;  Location: [facility];  Service: General;  Laterality: N/A;  . CORONARY ANGIOGRAPHY N/A 07/02/2020   Procedure: CORONARY ANGIOGRAPHY;  Surgeon: Troy Sine, MD;  Location: [facility];  Service: Cardiovascular;  Laterality: N/A;  . KNEE ARTHROSCOPY Right     Interim history/subjective:   Remains on dobutamine, vent.   Objective   Blood pressure (!) 80/67, pulse (!) 102, temperature 98.7 F (37.1 C), temperature source Axillary, resp. rate 20, height 5\' 9"  (1.753 m), weight 73.9 kg, SpO2 91 %. CVP:  [6 mmHg-8 mmHg] 8 mmHg  Vent Mode: PRVC FiO2 (%):  [40 %-100 %] 40 % Set Rate:  [20 bmp] 20 bmp Vt Set:  [560 mL] 560 mL PEEP:  [5 cmH20] 5 cmH20 Plateau Pressure:  [20 cmH20] 20 cmH20   Intake/Output Summary (Last 24 hours) at 06/24/2020 0906 Last data filed at 06/24/2020 0800 Gross per 24 hour  Intake 650.58 ml  Output 490 ml  Net 160.58 ml   Filed Weights   06/22/20 0400 06/23/20 0520 06/24/20 0413  Weight: 71.5 kg 74 kg 73.9 kg    Examination: General:  Thin elderly male in NAD Neuro:  Sedated HEENT:  [place]/AT, mild JVD, PERRL Cardiovascular:  RRR, no MRG Lungs:  Clear bilaterally.  Abdomen:  Soft, non-distended, normoactive Musculoskeletal:  No acute deformity Skin:  Intact, MMM   Ancillary tests   CBC: Recent Labs  Lab 06/21/20 0104 06/22/20 0426 06/22/20 1005 06/23/20 0400 06/24/20 0044 06/24/20  0438  WBC 6.2 8.3 8.1 7.3 6.8  --   HGB 11.8* 9.8* 10.3* 9.3* 9.5* 8.8*  HCT 34.4* 29.2* 29.4* 28.4* 28.8* 26.0*  MCV 90.8 92.4 92.2 93.4 92.6  --   PLT 187 156 152 149* 161  --     Basic Metabolic Panel: Recent Labs  Lab 06/20/20 0612 06/21/20 0104 06/22/20 0426 06/23/20 0400 06/24/20 0044 06/24/20 0315 06/24/20 0438  NA  --   136 135 135 133* 135 135  K  --  5.2* 4.0 3.7 3.8 4.0 3.5  CL  --  102 103 104 104 102  --   CO2  --  19* 22 24 21* 19*  --   GLUCOSE  --  124* 114* 103* 132* 211*  --   BUN  --  40* 41* 36* 35* 38*  --   CREATININE  --  2.79* 1.97* 1.71* 1.79* 2.11*  --   CALCIUM  --  8.9 8.1* 8.2* 8.1* 7.9*  --   MG 2.0  --   --   --   --   --   --    GFR: Estimated Creatinine Clearance: 25.1 mL/min (A) (by C-G formula based on SCr of 2.11 mg/dL (H)). Recent Labs  Lab 06/22/20 0426 06/22/20 1005 06/22/20 1220 06/23/20 0400 06/23/20 0858 06/24/20 0044 06/24/20 0634  WBC 8.3 8.1  --  7.3  --  6.8  --   LATICACIDVEN  --  2.5* 2.2*  --  1.3  --  1.8    Liver Function Tests: No results for input(s): AST, ALT, ALKPHOS, BILITOT, PROT, ALBUMIN in the last 168 hours. No results for input(s): LIPASE, AMYLASE in the last 168 hours. No results for input(s): AMMONIA in the last 168 hours.  ABG    Component Value Date/Time   PHART 7.459 (H) 06/24/2020 0438   PCO2ART 31.7 (L) 06/24/2020 0438   PO2ART 468 (H) 06/24/2020 0438   HCO3 22.5 06/24/2020 0438   TCO2 23 06/24/2020 0438   ACIDBASEDEF 1.0 06/24/2020 0438   O2SAT 100.0 06/24/2020 0438     Coagulation Profile: No results for input(s): INR, PROTIME in the last 168 hours.  Cardiac Enzymes: No results for input(s): CKTOTAL, CKMB, CKMBINDEX, TROPONINI in the last 168 hours.  HbA1C: Hgb A1c MFr Bld  Date/Time Value Ref Range Status  02/23/2009 12:20 PM  4.6 - 6.1 % Final   5.6 (NOTE) The ADA recommends the following therapeutic goal for glycemic control related to Hgb A1c measurement: Goal of therapy: <6.5 Hgb A1c  Reference: American Diabetes Association: Clinical Practice Recommendations 2010, Diabetes Care, 2010, 33: (Suppl  1).    CBG: Recent Labs  Lab 06/24/20 0323 06/24/20 0747  GLUCAP 89 96   Limited echo 12/13: EF 35% on dobutamine, mild AS only.   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  PFT  12/13 severe obstruction   Assessment & Plan:   Acute hypoxic respiratory failure requiring mechanical ventilation due to ischemic pulmonary edema superimposed on COPD - Full vent support - lasix 60mg  now - Will try SBT after lasix.  - VAP bundle  Crescendo angina due to multivessel coronary disease Ischemic cardiomyopathy Cardiac amyloidosis by PYP scan ? LV thrombus, not seen on most recent echo.  - Cardiology/CVTS following - Poor candidate for PCI due to nature of CAD - He was being planned for CABG, but now considering this acute illness he will likely not be.  - Medical management for now - Integrilin x  24 hours, stop date added.  - Heparin infusion - Dobutamine infusion - May need additional pressor for BP support.   Mild cognitive impairment Seizure disorder, absence type, well controlled - at high delirium risk, avoid benzo  Moderate nutritional risk Generalized frailty - Tube feeds  AKI due to contrast nephropathy versus low flow state. - follow BMP - BP support  Hyperglycemia with no history of DM - CBG monitoring and SSI   Daily Goals Checklist  Pain/Anxiety/Delirium protocol (if indicated): Precedex and fentanyl, seroquel to RASS -1 VAP protocol (if indicated): bundle in place. Respiratory support goals: full support  Blood pressure target: titrate dobutamine for MAP>65 and ScvO2>60 DVT prophylaxis: on heparin infusion Nutrition Status: high nutritional risk., TF ongoing GI prophylaxis: pantoprazole Fluid status goals: diuresis. Urinary catheter: Assessment of intravascular volume Central lines: left subclavian TLC Glucose control: SSI  Mobility/therapy needs: bedrest Antibiotic de-escalation: on no antibiotics.  Home medication reconciliation: reordered  Daily labs: CBC, BMP, Coox Code Status: full  Family Communication: Wife updated, very clear about DNR. [place] with ongoing support and hopeful for extubation.  Disposition: ICU   Critical care  time: 46 minutes.    Georgann Housekeeper, AGACNP-BC [place]  See Amion for personal pager PCCM on call pager [phone]  06/24/2020 9:14 AM

## 2020-06-24 NOTE — Progress Notes (Signed)
Foley flushed w/10cc NS, flushed easily-no resistance. Urine remains dark amber to tea colored-scant amounts. CCM aware.

## 2020-06-24 NOTE — Code Documentation (Addendum)
  Patient Name: Isaiah Davidson   MRN: [id number]   Date of Birth/ Sex: 10-Aug-1931 , male      Admission Date: 06/14/2020  Attending Provider: Rudean Haskell A,*  Primary Diagnosis: <principal problem not specified>   Indication: Pt was in his usual state of health until this AM, when he was noted to be tachycardic and in respiratory distress. Code blue was subsequently called. At the time of arrival on scene patient was being bag masked , responsive, and had a pulse.     Technical Description:  - CPR performance duration:  Not performed  - Was defibrillation or cardioversion used? No   - Was external pacer placed? No  - Was patient intubated pre/post CPR? Yes   Medications Administered: Y = Yes; Blank = No Amiodarone    Atropine    Calcium    Epinephrine    Lidocaine    Magnesium    Norepinephrine    Phenylephrine    Sodium bicarbonate    Vasopressin     Post CPR evaluation:  - Final Status - Was patient successfully resuscitated ? Yes - What is current rhythm? Atrial fibrillation - What is current hemodynamic status? stable  Miscellaneous Information:  - Labs sent, including: Troponin, Mg, BMP, Lactic Acid  - Primary team notified?  Yes  - Family Notified? Yes  - Additional notes/ transfer status:  On my arrival patient was being bagged mast in he was intubated.  Quick review of chart shows this patient has multi vessel CAD and being considered for CABG, severe COPD, aortic stenosis, and possible LV thrombus. EKG reviewed and shows new ST depressions in inferior leads. Patient appears to be in atrial fibrillation with normal rate. Hypotensive and pressors started. Tele shows patients heart rhythm has not been stable this afternoon.  Notified cardiology fellow on call.  Patient being moved to 2 AM and signed patient out to critical care PA via telephone. E link also notified. Continued workup by critical care team and cardiology.      Madalyn Rob, MD  06/24/2020,  3:31 AM

## 2020-06-24 NOTE — Progress Notes (Signed)
Georgann Housekeeper paged to bedside, patient sats decreased to 70-80% temporarily despite suctioning. SBP continues to be low--dipped to 50 then back to 70's-80's while CCM at bedside. Levo ordered and started per physician orders. Spouse at bedside updated by Georgann Housekeeper on current situation and outlook going forward with care. Patient remains a DNR.

## 2020-06-24 NOTE — Progress Notes (Signed)
Reported to CCM-Paul Hoffman that urine output is scant and dark amber vs bloody. Orders rec'd to flush foley.

## 2020-06-24 NOTE — Progress Notes (Signed)
Nutrition Follow-up / Consult  DOCUMENTATION CODES:   Severe malnutrition in context of chronic illness  INTERVENTION:   Tube feeding via OG tube: Vital AF 1.2 at 60 ml/h (1440 ml per day) Prosource TF 45 ml once daily  Provides 1768 kcal, 119 gm protein, 1168 ml free water daily  NUTRITION DIAGNOSIS:   Severe Malnutrition related to chronic illness (CAD, CKD stage III, CHF) as evidenced by severe muscle depletion,severe fat depletion.  Ongoing  GOAL:   Patient will meet greater than or equal to 90% of their needs  Progressing   MONITOR:   PO intake,Supplement acceptance,Labs,Weight trends,I & O's  REASON FOR ASSESSMENT:   Ventilator,Consult Enteral/tube feeding initiation and management  ASSESSMENT:   86 year old male with PMH of multivessel CAD, CKD stage III, seizures, HTN, HLD. Pt being considered for potential CABG. Pt found to have acute biventricular heart failure.  Discussed patient in ICU rounds and with RN today. Patient had an ischemia event and required intubation overnight. Received MD Consult for TF initiation and management. OG tube in place. Cortrak has been ordered, to be placed today. Currently receiving Vital High Protein at 40 ml/h with Prosource TF 45 ml BID per adult TF protocol.   Patient is currently intubated on ventilator support MV: 11.7 L/min Temp (24hrs), Avg:98.3 F (36.8 C), Min:97.6 F (36.4 C), Max:98.7 F (37.1 C)   Labs reviewed. BUN 39, creatinine 2.31 CBG: 96-67  Medications reviewed and include colace, novolog, miralax, levophed.  I/O +5.9 L since admission  Diet Order:   Diet Order            Diet Heart Room service appropriate? Yes; Fluid consistency: Thin  Diet effective now                 EDUCATION NEEDS:   Education needs have been addressed  Skin:  Skin Assessment: Reviewed RN Assessment  Last BM:  12/14  Height:   Ht Readings from Last 1 Encounters:  07/03/2020 5\' 9"  (1.753 m)    Weight:    Wt Readings from Last 1 Encounters:  06/24/20 73.9 kg    Ideal Body Weight:  72.7 kg  BMI:  Body mass index is 24.06 kg/m.  Estimated Nutritional Needs:   Kcal:  1705  Protein:  110-125 gm  Fluid:  >/= 1.7 L    Lucas Mallow, RD, LDN, CNSC Please refer to Amion for contact information.

## 2020-06-25 DIAGNOSIS — R570 Cardiogenic shock: Secondary | ICD-10-CM

## 2020-06-25 DIAGNOSIS — J9601 Acute respiratory failure with hypoxia: Secondary | ICD-10-CM

## 2020-06-25 LAB — CBC
HCT: 28.4 % — ABNORMAL LOW (ref 39.0–52.0)
Hemoglobin: 9.2 g/dL — ABNORMAL LOW (ref 13.0–17.0)
MCH: 30.6 pg (ref 26.0–34.0)
MCHC: 32.4 g/dL (ref 30.0–36.0)
MCV: 94.4 fL (ref 80.0–100.0)
Platelets: 182 10*3/uL (ref 150–400)
RBC: 3.01 MIL/uL — ABNORMAL LOW (ref 4.22–5.81)
RDW: 13.6 % (ref 11.5–15.5)
WBC: 11.8 10*3/uL — ABNORMAL HIGH (ref 4.0–10.5)
nRBC: 0 % (ref 0.0–0.2)

## 2020-06-25 LAB — MAGNESIUM: Magnesium: 2.3 mg/dL (ref 1.7–2.4)

## 2020-06-25 LAB — BASIC METABOLIC PANEL
Anion gap: 12 (ref 5–15)
BUN: 51 mg/dL — ABNORMAL HIGH (ref 8–23)
CO2: 19 mmol/L — ABNORMAL LOW (ref 22–32)
Calcium: 7.7 mg/dL — ABNORMAL LOW (ref 8.9–10.3)
Chloride: 103 mmol/L (ref 98–111)
Creatinine, Ser: 3.21 mg/dL — ABNORMAL HIGH (ref 0.61–1.24)
GFR, Estimated: 18 mL/min — ABNORMAL LOW (ref >60)
Glucose, Bld: 144 mg/dL — ABNORMAL HIGH (ref 70–99)
Potassium: 3.8 mmol/L (ref 3.5–5.1)
Sodium: 134 mmol/L — ABNORMAL LOW (ref 135–145)

## 2020-06-25 LAB — GLUCOSE, CAPILLARY
Glucose-Capillary: 128 mg/dL — ABNORMAL HIGH (ref 70–99)
Glucose-Capillary: 113 mg/dL — ABNORMAL HIGH (ref 70–99)
Glucose-Capillary: 114 mg/dL — ABNORMAL HIGH (ref 70–99)

## 2020-06-25 LAB — COOXEMETRY PANEL
Carboxyhemoglobin: 0.8 % (ref 0.5–1.5)
Methemoglobin: 0.8 % (ref 0.0–1.5)
O2 Saturation: 48.1 %
Total hemoglobin: 9.6 g/dL — ABNORMAL LOW (ref 12.0–16.0)

## 2020-06-25 LAB — HEPARIN LEVEL (UNFRACTIONATED): Heparin Unfractionated: 0.59 IU/mL (ref 0.30–0.70)

## 2020-06-25 LAB — PHOSPHORUS: Phosphorus: 3.9 mg/dL (ref 2.5–4.6)

## 2020-06-25 MED ORDER — MORPHINE SULFATE (PF) 2 MG/ML IV SOLN: 2.0000 mg | INTRAVENOUS | Status: DC | PRN

## 2020-06-25 MED ORDER — ONDANSETRON HCL 4 MG/2ML IJ SOLN: 4.0000 mg | Freq: Four times a day (QID) | INTRAMUSCULAR | Status: DC | PRN

## 2020-06-25 MED ORDER — HALOPERIDOL LACTATE 5 MG/ML IJ SOLN: 2.5000 mg | INTRAMUSCULAR | Status: DC | PRN

## 2020-06-25 MED ORDER — GLYCOPYRROLATE 0.2 MG/ML IJ SOLN: 0.2000 mg | INTRAMUSCULAR | Status: DC | PRN

## 2020-06-25 MED ORDER — ONDANSETRON 4 MG PO TBDP: 4.0000 mg | ORAL_TABLET | Freq: Four times a day (QID) | ORAL | Status: DC | PRN

## 2020-06-25 MED ORDER — LORAZEPAM 2 MG/ML IJ SOLN: 2.0000 mg | INTRAMUSCULAR | Status: DC | PRN

## 2020-06-25 MED ORDER — DIPHENHYDRAMINE HCL 50 MG/ML IJ SOLN: 25.0000 mg | INTRAMUSCULAR | Status: DC | PRN

## 2020-06-25 MED ORDER — GLYCOPYRROLATE 1 MG PO TABS: 1.0000 mg | ORAL_TABLET | ORAL | Status: DC | PRN

## 2020-06-25 MED ORDER — POLYVINYL ALCOHOL 1.4 % OP SOLN
1.0000 [drp] | Freq: Four times a day (QID) | OPHTHALMIC | Status: DC | PRN
  Filled 2020-06-25: qty 15

## 2020-06-26 SURGERY — CORONARY ARTERY BYPASS GRAFTING (CABG)
Anesthesia: General | Site: Chest

## 2020-07-07 ENCOUNTER — Ambulatory Visit: Payer: Medicare HMO | Admitting: Internal Medicine

## 2020-07-11 NOTE — Death Summary Note (Signed)
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 year old male who was admitted with known severe CAD with ongoing ischemic symptoms. Baseline ischemic cardiomyopathy. LHC revealed diffuse disease not amenable to PCI. CABG was considered but admitted for preoperative optimization. Maintained on heparin drip and aspirin. Cr continued to be above baseline despite aggressive interventions and diuresis. Had Respiratory event that led to intubation and ICU admission. Labs at that time revealed a troponin of 16k that subsequently rose. Placed on dobutamine and levophed. Despite aggressive interventions his UOP dwindled and pressors increased. After speaking with wife, it was agreed to pursue comfort measures on Jul 21, 2023. He was terminally extubated. He subsequently passed away at 1145 am in the presence of his loving wife.    Pertinent Labs and Studies  Significant Diagnostic Studies NM CARDIAC AMYLOID TUMOR LOC INFLAM SPECT 1 DAY  Result Date: 06/23/2020 CLINICAL DATA:  Heart failure, hypertension EXAM: NUCLEAR MEDICINE TUMOR LOCALIZATION. PYP CARDIAC  AMYLOIDOSIS SCAN WITH SPECT TECHNIQUE: Following intravenous administration of radiopharmaceutical, anterior planar images of the chest were obtained. Regions of interest were placed on the heart and contralateral chest wall for quantitative assessment. Additional SPECT imaging of the chest was obtained. RADIOPHARMACEUTICALS:  21.8 mCi TECHNETIUM 99 PYROPHOSPHATE FINDINGS: Planar Visual assessment: Anterior planar imaging demonstrates radiotracer uptake within the heart greater than uptake within the adjacent ribs (Grade 3). Quantitative assessment : Quantitative assessment of the cardiac uptake compared to the contralateral chest wall is equal to 2.17 (H/CL = 2.17). SPECT assessment: SPECT imaging of the chest demonstrates clear radiotracer accumulation within the LEFT ventricle. IMPRESSION: Visual and quantitative assessment (grade 3, H/CL = 2.17) are strongly suggestive of transthyretin amyloidosis. Electronically Signed   By: Lavonia Dana M.D.   On: 06/23/2020 13:49   CARDIAC CATHETERIZATION  Result Date: 07/14/20  Mid LM to Dist LM lesion is 85% stenosed.  Ost LAD to Prox LAD lesion is 40% stenosed.  2nd Diag lesion is 90% stenosed.  Lat 2nd Diag lesion is 80% stenosed.  Ost Cx lesion is 30% stenosed.  Prox Cx lesion is 70% stenosed.  Mid Cx to Dist Cx lesion is 80% stenosed.  Prox RCA lesion is 70% stenosed.  RPDA-1 lesion is 70% stenosed.  RPDA-2 lesion is 95% stenosed.  2nd RPL lesion is 50% stenosed.  Mid LAD to Dist LAD lesion is 50% stenosed.  Dist RCA lesion is 50% stenosed.  Severe coronary calcification and multivessel CAD with focal 85% distal left main stenosis; diffuse 40% LAD stenosis with 50% mid stenosis and 90 and 80% stenoses in the proximal and mid diagonal vessel; 30% ostial circumflex stenosis with 70% mid stenosis and 80% stenosis in the second marginal branch;  diffusely irregular dominant RCA with 70% mid stenosis, 60% stenosis proximal to the PDA takeoff with 70 and 95%  proximal and distal PDA stenoses and 50% inferior LV stenosis. Unsuccessful attempt at crossing the aortic valve. RECOMMENDATION: Surgical consultation for CABG revascularization surgery.  We will schedule patient for 2D echo Doppler study.  Aggressive lipid-lowering therapy.  Patient has significant hypertension, increase medical therapy pending surgical revascularization.   CT CORONARY MORPH W/CTA COR W/SCORE W/CA W/CM &/OR WO/CM  Addendum Date: 06/15/2020   ADDENDUM REPORT: 06/15/2020 00:12 CLINICAL DATA:  9M with chest pain EXAM: Cardiac/Coronary CTA TECHNIQUE: The patient was scanned on a [employer]. FINDINGS: A 100 kV prospective scan was triggered in the descending thoracic aorta at 111 HU's. Axial non-contrast 3 mm slices were carried out through the heart. The data set was analyzed on a dedicated work station and scored using the [employer]. Gantry rotation speed was 250 msecs and collimation was .6 mm. 0.8 mg of sl NTG was given. The 3D data set was reconstructed in 5% intervals of the 35-75% of the R-R cycle. Phases were analyzed on a dedicated work station using MPR, MIP and VRT modes. The patient received 80 cc of contrast. Coronary Arteries:  Normal coronary origin.  Right dominance. RCA is a large dominant artery that gives rise to PDA and PLA. Mixed plaque in the proximal to RCA causes moderate (50-69%) stenosis. Diffuse calcified plaques throughout mid to distal RCA Left main is a large artery that gives rise to LAD and LCX arteries. LAD is a large vessel. Calcified plaque in the proximal to mid LAD causes severe (70-99%) stenosis LCX is a non-dominant artery that gives rise to one large OM1 branch. There is calcified plaque in the proximal to mid LCX causes severe (70-99%) stenosis Other findings: Left Ventricle: Normal size.  Asymmetric basal septal hypertrophy Left Atrium: Mild enlargement Pulmonary Veins: Normal configuration Right Ventricle: Normal size Right Atrium: Mild  enlargement Cardiac valves: Mild aortic valve calcifications Thoracic aorta: Normal size Pulmonary Arteries: Normal size Systemic Veins: Normal drainage Pericardium: Normal thickness IMPRESSION: 1. Coronary calcium score of 3661. This was 96th percentile for age and sex matched control. 2. Normal coronary origin with right dominance. 3. Severe diffuse coronary calcifications limits interpretability of study and can lead to overestimation of stenosis. 4. Calcified plaque in the proximal to mid LAD causes severe (70-99%) stenosis. CTFFR across lesion in proximal LAD is 0.61 and falls to <0.5 in mid LAD, suggesting functional significance 5. Calcified plaque in the proximal to mid LCX causes severe (70-99%) stenosis. CTFFR <0.5 in mid LCX, suggesting functional significance 6. Mixed plaque in the proximal to RCA causes moderate (50-69%) stenosis, CTFFR 0.82 suggesting lesion is not functionally significant. Diffuse calcified plaques throughout mid to distal RCA with CTFFR falling to 0.63 in distal RCA, suggesting functional significance CAD-RADS 4 Severe stenosis. (70-99%). Cardiac catheterization is recommended. Consider symptom-guided anti-ischemic pharmacotherapy as well as risk factor modification per guideline directed care. Electronically Signed   By: Oswaldo Milian MD   On: 06/15/2020 00:12   Result Date: 06/15/2020 EXAM: OVER-READ INTERPRETATION  CT CHEST The following report is an over-read performed by radiologist Dr. Vinnie Langton of [facility], [place] on 06/12/2020. This over-read does not include interpretation of cardiac or coronary anatomy or pathology. The coronary calcium score/coronary CTA interpretation by the cardiologist is attached. COMPARISON:  None. FINDINGS: Aortic atherosclerosis. 4 mm right middle lobe nodule (axial image 19 of series 12). Within the visualized portions  of the thorax there are no other larger more suspicious appearing pulmonary nodules or masses, there is no  acute consolidative airspace disease, no pleural effusions, no pneumothorax and no lymphadenopathy. Mild elevation of the left hemidiaphragm. Visualized portions of the upper abdomen demonstrates multiple low-attenuation lesions in the visualized portions of the liver and in the left kidney, previously characterized as simple cysts. There are no aggressive appearing lytic or blastic lesions noted in the visualized portions of the skeleton. IMPRESSION: 1. 4 mm right middle lobe pulmonary nodule, nonspecific, but statistically likely benign. No follow-up needed if patient is low-risk. Non-contrast chest CT can be considered in 12 months if patient is high-risk. This recommendation follows the consensus statement: Guidelines for Management of Incidental Pulmonary Nodules Detected on CT Images: From the Fleischner Society 2017; Radiology 2017; 284:228-243. 2. Multiple prominent borderline enlarged mediastinal and bilateral hilar lymph nodes. This is nonspecific, but clinical correlation for signs and symptoms of lymphoproliferative disorder is recommended. 3.  Aortic Atherosclerosis (ICD10-I70.0). 4. Additional incidental findings, as above. Electronically Signed: By: Vinnie Langton M.D. On: 06/13/2020 06:27   DG CHEST PORT 1 VIEW  Result Date: 06/24/2020 CLINICAL DATA:  Shortness of breath EXAM: PORTABLE CHEST 1 VIEW COMPARISON:  06/21/2020 FINDINGS: Endotracheal tube 7 cm above the carina. Left central line is unchanged. Left lower lobe atelectasis or infiltrate. Possible small left effusion. No confluent opacity on the right. Heart is normal size. IMPRESSION: Endotracheal tube 7 cm above the carina. New left lower lobe atelectasis or infiltrate with small left effusion. Electronically Signed   By: Rolm Baptise M.D.   On: 06/24/2020 03:47   DG CHEST PORT 1 VIEW  Result Date: 06/21/2020 CLINICAL DATA:  Central line placement. EXAM: PORTABLE CHEST 1 VIEW COMPARISON:  June 20, 2020. FINDINGS: The heart  size and mediastinal contours are within normal limits. Interval placement of left subclavian catheter with distal tip in expected position of cavoatrial junction. No pneumothorax or pleural effusion is noted. Both lungs are clear. The visualized skeletal structures are unremarkable. IMPRESSION: Interval placement of left subclavian catheter with distal tip in expected position of cavoatrial junction. No pneumothorax seen. Electronically Signed   By: Marijo Conception M.D.   On: 06/21/2020 16:10   DG CHEST PORT 1 VIEW  Result Date: 06/20/2020 CLINICAL DATA:  Congestive heart failure. EXAM: PORTABLE CHEST 1 VIEW COMPARISON:  May 22, 2020. FINDINGS: The heart size and mediastinal contours are within normal limits. Both lungs are clear. The visualized skeletal structures are unremarkable. IMPRESSION: No active disease. Aortic Atherosclerosis (ICD10-I70.0). Electronically Signed   By: Marijo Conception M.D.   On: 06/20/2020 11:33   DG Abd Portable 1V  Result Date: 06/24/2020 CLINICAL DATA:  OG tube placement EXAM: PORTABLE ABDOMEN - 1 VIEW COMPARISON:  None. FINDINGS: OG tube tip is in the stomach. Nonobstructed bowel gas pattern. No free air. IMPRESSION: OG tube tip in the stomach. Electronically Signed   By: Rolm Baptise M.D.   On: 06/24/2020 03:58   ECHOCARDIOGRAM COMPLETE  Result Date: 06/20/2020    ECHOCARDIOGRAM REPORT   Patient Name:   RALPHEAL ZAPPONE Date of Exam: 06/20/2020 Medical Rec #:  [id number]            Height:       69.0 in Accession #:    [id number]           Weight:       161.6 lb Date of Birth:  January 02, 1932  BSA:          1.887 m Patient Age:    21 years             BP:           96/77 mmHg Patient Gender: M                    HR:           78 bpm. Exam Location:  Inpatient Procedure: 2D Echo, Cardiac Doppler, Color Doppler and Intracardiac            Opacification Agent Indications:    CAD  History:        Patient has prior history of Echocardiogram examinations, most                  recent 06/22/2017. Angina; Risk Factors:Dyslipidemia,                 Hypertension and Former Smoker. DOE. LVH.  Sonographer:    Clayton Lefort RDCS (AE) Referring Phys: [id number] [facility] Z ATKINS  Sonographer Comments: Definity use attempted to better visualize LV apex. After initial 2 ml dose, no Definity appeared on screen despite IV flushing fine, even after several minutes. Nurse notified. IMPRESSIONS  1. Left ventricular ejection fraction, by estimation, is 25 to 30%. The left ventricle has severely decreased function.There is global hypokinesis with apical wall motion abnormalities as specified. There is severe left ventricular hypertrophy. Left ventricular diastolic parameters are consistent with Grade II diastolic dysfunction (pseudonormalization). A left ventricular thrombus is noted.  2. The aortic valve is tricuspid. There is severe calcifcation of the aortic valve. Aortic valve regurgitation is not visualized. Aortic valve area, by VTI measures 0.98 cm. Because left ventricular function and stroke volume are significantly reduced,  cannot exclude low flow low gradient aortic stenosis; significant valvular calcification.  3. Right ventricular systolic function is moderately reduced. The right ventricular size is normal. Mildly increased right ventricular wall thickness. There is mildly elevated pulmonary artery systolic pressure.  4. The mitral valve is grossly normal. Moderate mitral valve regurgitation.  5. Tricuspid valve regurgitation is mild to moderate.  6. The inferior vena cava is dilated in size with <50% respiratory variability, suggesting right atrial pressure of 15 mmHg. Comparison(s): A prior study was performed on 1213/2018. Significant changes have occured in the left ventricle, right ventricle, and aortic valve. Conclusion(s)/Recommendation(s): Discussed with primary cardiologist and heparin has been ordered. FINDINGS  Left Ventricle: LV Thombus Noted. Left ventricular ejection  fraction, by estimation, is 25 to 30%. The left ventricle has severely decreased function. The left ventricle demonstrates regional wall motion abnormalities. Definity contrast agent was given IV to delineate the left ventricular endocardial borders. The left ventricular internal cavity size was small. There is severe left ventricular hypertrophy. Left ventricular diastolic parameters are consistent with Grade II diastolic dysfunction (pseudonormalization).  LV Wall Scoring: The entire apex is akinetic. The mid anterolateral segment, mid inferoseptal segment, mid anterior segment, and mid inferior segment are hypokinetic. Right Ventricle: The right ventricular size is normal. Mildly increased right ventricular wall thickness. Right ventricular systolic function is moderately reduced. There is mildly elevated pulmonary artery systolic pressure. The tricuspid regurgitant velocity is 2.57 m/s, and with an assumed right atrial pressure of 15 mmHg, the estimated right ventricular systolic pressure is 39.7 mmHg. Left Atrium: Left atrial size was normal in size. Right Atrium: Right atrial size was normal in size. Pericardium: There is no evidence of  pericardial effusion. Mitral Valve: The mitral valve is grossly normal. Moderate mitral valve regurgitation. Tricuspid Valve: The tricuspid valve is normal in structure. Tricuspid valve regurgitation is mild to moderate. Aortic Valve: Because LVEF and stroke volume are significantly reduced, cannot exclude low flow low gradient aortic stenosis; significant valvular calcification. The aortic valve is tricuspid. There is severe calcifcation of the aortic valve. Aortic valve regurgitation is not visualized. Aortic valve mean gradient measures 4.0 mmHg. Aortic valve peak gradient measures 7.6 mmHg. Aortic valve area, by VTI measures 0.98 cm. Pulmonic Valve: The pulmonic valve was not well visualized. Pulmonic valve regurgitation is not visualized. Aorta: The aortic root and  ascending aorta are structurally normal, with no evidence of dilitation. Venous: The inferior vena cava is dilated in size with less than 50% respiratory variability, suggesting right atrial pressure of 15 mmHg. IAS/Shunts: The atrial septum is grossly normal.  LEFT VENTRICLE PLAX 2D LVIDd:         3.70 cm     Diastology LVIDs:         3.20 cm     LV e' medial:    3.48 cm/s LV PW:         1.30 cm     LV E/e' medial:  15.8 LV IVS:        1.60 cm     LV e' lateral:   6.09 cm/s LVOT diam:     2.00 cm     LV E/e' lateral: 9.0 LV SV:         22 LV SV Index:   12 LVOT Area:     3.14 cm  LV Volumes (MOD) LV vol d, MOD A2C: 92.8 ml LV vol d, MOD A4C: 92.1 ml LV vol s, MOD A2C: 64.6 ml LV vol s, MOD A4C: 53.0 ml LV SV MOD A2C:     28.2 ml LV SV MOD A4C:     92.1 ml LV SV MOD BP:      33.5 ml RIGHT VENTRICLE            IVC RV Basal diam:  3.90 cm    IVC diam: 2.30 cm RV Mid diam:    3.10 cm RV S prime:     4.12 cm/s TAPSE (M-mode): 0.8 cm LEFT ATRIUM           Index       RIGHT ATRIUM           Index LA diam:      3.30 cm 1.75 cm/m  RA Area:     17.40 cm LA Vol (A2C): 52.7 ml 27.93 ml/m RA Volume:   53.30 ml  28.25 ml/m LA Vol (A4C): 46.0 ml 24.38 ml/m  AORTIC VALVE AV Area (Vmax):    0.88 cm AV Area (Vmean):   0.98 cm AV Area (VTI):     0.98 cm AV Vmax:           138.00 cm/s AV Vmean:          90.200 cm/s AV VTI:            0.226 m AV Peak Grad:      7.6 mmHg AV Mean Grad:      4.0 mmHg LVOT Vmax:         38.70 cm/s LVOT Vmean:        28.200 cm/s LVOT VTI:          0.070 m LVOT/AV VTI ratio: 0.31  AORTA Ao Root diam:  3.00 cm Ao Asc diam:  3.40 cm MITRAL VALVE                 TRICUSPID VALVE MV Area (PHT): 4.41 cm      TR Peak grad:   26.4 mmHg MV Decel Time: 172 msec      TR Vmax:        257.00 cm/s MR Peak grad:    77.1 mmHg MR Mean grad:    42.0 mmHg   SHUNTS MR Vmax:         439.00 cm/s Systemic VTI:  0.07 m MR Vmean:        293.0 cm/s  Systemic Diam: 2.00 cm MR PISA:         1.57 cm MR PISA Eff ROA: 14 mm MR  PISA Radius:  0.50 cm MV E velocity: 55.10 cm/s MV A velocity: 52.40 cm/s MV E/A ratio:  1.05 Rudean Haskell MD Electronically signed by Rudean Haskell MD Signature Date/Time: 06/20/2020/12:50:56 PM    Final    VAS US DOPPLER PRE CABG  Result Date: 06/21/2020 PREOPERATIVE VASCULAR EVALUATION  Risk Factors:     Hypertension, hyperlipidemia, coronary artery disease. Comparison Study: No prior study Performing Technologist: Darlin Coco  Examination Guidelines: A complete evaluation includes B-mode imaging, spectral Doppler, color Doppler, and power Doppler as needed of all accessible portions of each vessel. Bilateral testing is considered an integral part of a complete examination. Limited examinations for reoccurring indications may be performed as noted.  Right Carotid Findings: +----------+--------+--------+--------+------------+------------------+           PSV cm/sEDV cm/sStenosisDescribe    Comments           +----------+--------+--------+--------+------------+------------------+ CCA Prox  63      17                          intimal thickening +----------+--------+--------+--------+------------+------------------+ CCA Distal41      12                          intimal thickening +----------+--------+--------+--------+------------+------------------+ ICA Prox  47      20              heterogenous                   +----------+--------+--------+--------+------------+------------------+ ICA Distal53      23                                             +----------+--------+--------+--------+------------+------------------+ ECA       49      10                                             +----------+--------+--------+--------+------------+------------------+ Portions of this table do not appear on this page. +----------+--------+-------+--------+------------+           PSV cm/sEDV cmsDescribeArm Pressure  +----------+--------+-------+--------+------------+ [id number]                                  +----------+--------+-------+--------+------------+ +---------+--------+--+--------+--+ VertebralPSV cm/s39EDV cm/s12 +---------+--------+--+--------+--+ Left Carotid Findings: +----------+--------+--------+--------+------------+------------------+  PSV cm/sEDV cm/sStenosisDescribe    Comments           +----------+--------+--------+--------+------------+------------------+ CCA Prox  60      16                          intimal thickening +----------+--------+--------+--------+------------+------------------+ CCA Distal47      18                          intimal thickening +----------+--------+--------+--------+------------+------------------+ ICA Prox  29      12              heterogenous                   +----------+--------+--------+--------+------------+------------------+ ICA Distal47      22                                             +----------+--------+--------+--------+------------+------------------+ ECA       36      6                                              +----------+--------+--------+--------+------------+------------------+ +---------+--------+--+--------+--+ VertebralPSV cm/s64EDV cm/s27 +---------+--------+--+--------+--+  ABI Findings: +--------+------------------+-----+-----------+--------+ Right   Rt Pressure (mmHg)IndexWaveform   Comment  +--------+------------------+-----+-----------+--------+ Brachial                       triphasic           +--------+------------------+-----+-----------+--------+ PTA                            multiphasic         +--------+------------------+-----+-----------+--------+ DP                             multiphasic         +--------+------------------+-----+-----------+--------+ +--------+------------------+-----+-----------+-------+ Left    Lt Pressure  (mmHg)IndexWaveform   Comment +--------+------------------+-----+-----------+-------+ Brachial                       triphasic          +--------+------------------+-----+-----------+-------+ PTA                            multiphasic        +--------+------------------+-----+-----------+-------+ DP                             multiphasic        +--------+------------------+-----+-----------+-------+  Right Doppler Findings: +--------+--------+-----+---------+--------+ Site    PressureIndexDoppler  Comments +--------+--------+-----+---------+--------+ Brachial             triphasic         +--------+--------+-----+---------+--------+ Radial               triphasic         +--------+--------+-----+---------+--------+ Ulnar                triphasic         +--------+--------+-----+---------+--------+  Left Doppler Findings: +--------+--------+-----+---------+--------+ Site    PressureIndexDoppler  Comments +--------+--------+-----+---------+--------+ Brachial             triphasic         +--------+--------+-----+---------+--------+ Radial               triphasic         +--------+--------+-----+---------+--------+ Ulnar                triphasic         +--------+--------+-----+---------+--------+  Summary: Right Carotid: The extracranial vessels were near-normal with only minimal wall                thickening or plaque. Left Carotid: The extracranial vessels were near-normal with only minimal wall               thickening or plaque. Vertebrals:  Bilateral vertebral arteries demonstrate antegrade flow. Subclavians: Normal flow hemodynamics were seen in bilateral subclavian              arteries. Right Upper Extremity: Doppler waveform obliterate with right radial compression. Doppler waveforms remain within normal limits with right ulnar compression. Left Upper Extremity: Doppler waveform obliterate with left radial compression. Doppler waveforms  remain within normal limits with left ulnar compression.  Electronically signed by Jamelle Haring on 06/21/2020 at 1:06:45 PM.    Final    ECHOCARDIOGRAM LIMITED  Result Date: 06/23/2020    ECHOCARDIOGRAM LIMITED REPORT   Patient Name:   LA DIBELLA Date of Exam: 06/22/2020 Medical Rec #:  [id number]            Height:       69.0 in Accession #:    [id number]           Weight:       157.6 lb Date of Birth:  06/07/1932            BSA:          1.867 m Patient Age:    34 years             BP:           106/70 mmHg Patient Gender: M                    HR:           90 bpm. Exam Location:  Inpatient Procedure: Limited Echo, Cardiac Doppler, Color Doppler and Intracardiac            Opacification Agent Indications:     aortic stenosis  History:         Patient has prior history of Echocardiogram examinations, most                  recent 08/22/2019. CAD.  Sonographer:     Johny Chess Referring Phys:  [place] Diagnosing Phys: Loralie Champagne MD IMPRESSIONS  1. Left ventricular ejection fraction, by estimation, is 35-40%. The left ventricle has mildly decreased function. The left ventricle demonstrates regional wall motion abnormalities with peri-apical hypokinesis. There is mild left ventricular hypertrophy. Left ventricular diastolic parameters are consistent with Grade II diastolic dysfunction (pseudonormalization). No LV thrombus was seen.  2. The mitral valve is normal in structure. Mild mitral valve regurgitation. No evidence of mitral stenosis. Moderate mitral annular calcification.  3. The aortic valve is tricuspid. Mild aortic valve stenosis. Aortic valve mean gradient measures 10.0 mmHg.  4. Left atrial size was mildly dilated.  5. Right ventricular systolic function is normal. The right ventricular size is mildly  enlarged.  6. Limited echo. FINDINGS  Left Ventricle: Left ventricular ejection fraction, by estimation, is 35 to 40%. The left ventricle has moderately decreased  function. The left ventricle demonstrates regional wall motion abnormalities. Definity contrast agent was given IV to delineate the left ventricular endocardial borders. The left ventricular internal cavity size was normal in size. There is mild left ventricular hypertrophy. Left ventricular diastolic parameters are consistent with Grade II diastolic dysfunction (pseudonormalization). Right Ventricle: The right ventricular size is mildly enlarged. No increase in right ventricular wall thickness. Right ventricular systolic function is normal. Left Atrium: Left atrial size was mildly dilated. Right Atrium: Right atrial size was normal in size. Mitral Valve: The mitral valve is normal in structure. There is mild calcification of the mitral valve leaflet(s). Moderate mitral annular calcification. Mild mitral valve regurgitation. No evidence of mitral valve stenosis. Aortic Valve: The aortic valve is tricuspid. Mild aortic stenosis is present. Aortic valve mean gradient measures 10.0 mmHg. Aortic valve peak gradient measures 14.9 mmHg. Aortic valve area, by VTI measures 1.16 cm. Aorta: The aortic root is normal in size and structure. Venous: The inferior vena cava was not well visualized. IAS/Shunts: No atrial level shunt detected by color flow Doppler. LEFT VENTRICLE PLAX 2D LVOT diam:     2.00 cm LV SV:         39 LV SV Index:   21 LVOT Area:     3.14 cm  AORTIC VALVE AV Area (Vmax):    1.15 cm AV Area (Vmean):   1.14 cm AV Area (VTI):     1.16 cm AV Vmax:           193.00 cm/s AV Vmean:          132.500 cm/s AV VTI:            0.332 m AV Peak Grad:      14.9 mmHg AV Mean Grad:      10.0 mmHg LVOT Vmax:         70.95 cm/s LVOT Vmean:        48.050 cm/s LVOT VTI:          0.123 m LVOT/AV VTI ratio: 0.37 MITRAL VALVE MV Area (PHT): 5.02 cm    SHUNTS MV Decel Time: 151 msec    Systemic VTI:  0.12 m MV E velocity: 89.20 cm/s  Systemic Diam: 2.00 cm MV A velocity: 63.50 cm/s MV E/A ratio:  1.40 Loralie Champagne MD  Electronically signed by Loralie Champagne MD Signature Date/Time: 06/23/2020/1:15:36 PM    Final (Updated)     Microbiology Recent Results (from the past 240 hour(s))  SARS CORONAVIRUS 2 (TAT 6-24 HRS) Nasopharyngeal Nasopharyngeal Swab     Status: None   Collection Time: 06/17/20 11:22 AM   Specimen: Nasopharyngeal Swab  Result Value Ref Range Status   SARS Coronavirus 2 NEGATIVE NEGATIVE Final    Comment: (NOTE) SARS-CoV-2 target nucleic acids are NOT DETECTED.  The SARS-CoV-2 RNA is generally detectable in upper and lower respiratory specimens during the acute phase of infection. Negative results do not preclude SARS-CoV-2 infection, do not rule out co-infections with other pathogens, and should not be used as the sole basis for treatment or other patient management decisions. Negative results must be combined with clinical observations, patient history, and epidemiological information. The expected result is Negative.  Fact Sheet for Patients: SugarRoll.be  Fact Sheet for Healthcare Providers: https://www.woods-mathews.com/  This test is not yet approved or cleared by the [place] FDA  and  has been authorized for detection and/or diagnosis of SARS-CoV-2 by FDA under an Emergency Use Authorization (EUA). This EUA will remain  in effect (meaning this test can be used) for the duration of the COVID-19 declaration under Se ction 564(b)(1) of the Act, 21 U.S.C. section 360bbb-3(b)(1), unless the authorization is terminated or revoked sooner.  Performed at [facility], [place] [address] [place], [place] [address]   MRSA PCR Screening     Status: None   Collection Time: 06/24/20  3:39 AM   Specimen: Nasopharyngeal  Result Value Ref Range Status   MRSA by PCR NEGATIVE NEGATIVE Final    Comment:        The GeneXpert MRSA Assay (FDA approved for NASAL specimens only), is one component of a comprehensive MRSA  colonization surveillance program. It is not intended to diagnose MRSA infection nor to guide or monitor treatment for MRSA infections. Performed at [facility], [place] [address], [place], [place] [address]     Lab Basic Metabolic Panel: Recent Labs  Lab 06/20/20 0612 06/21/20 0104 06/23/20 0400 06/24/20 0044 06/24/20 0315 06/24/20 0438 06/24/20 0855 06/24/20 1942 2020-07-23 0421  NA  --    < > 135 133* 135 135 135  --  134*  K  --    < > 3.7 3.8 4.0 3.5 4.2  --  3.8  CL  --    < > 104 104 102  --  104  --  103  CO2  --    < > 24 21* 19*  --  22  --  19*  GLUCOSE  --    < > 103* 132* 211*  --  129*  --  144*  BUN  --    < > 36* 35* 38*  --  39*  --  51*  CREATININE  --    < > 1.71* 1.79* 2.11*  --  2.31*  --  3.21*  CALCIUM  --    < > 8.2* 8.1* 7.9*  --  8.3*  --  7.7*  MG 2.0  --   --   --   --   --  2.3 2.4 2.3  PHOS  --   --   --   --   --   --  3.6 3.5 3.9   < > = values in this interval not displayed.   Liver Function Tests: No results for input(s): AST, ALT, ALKPHOS, BILITOT, PROT, ALBUMIN in the last 168 hours. No results for input(s): LIPASE, AMYLASE in the last 168 hours. No results for input(s): AMMONIA in the last 168 hours. CBC: Recent Labs  Lab 06/22/20 1005 06/23/20 0400 06/24/20 0044 06/24/20 0438 06/24/20 0855 07-23-20 0421  WBC 8.1 7.3 6.8  --  8.1 11.8*  HGB 10.3* 9.3* 9.5* 8.8* 9.7* 9.2*  HCT 29.4* 28.4* 28.8* 26.0* 30.2* 28.4*  MCV 92.2 93.4 92.6  --  94.7 94.4  PLT 152 149* 161  --  173 182   Cardiac Enzymes: No results for input(s): CKTOTAL, CKMB, CKMBINDEX, TROPONINI in the last 168 hours. Sepsis Labs: Recent Labs  Lab 06/22/20 1005 06/22/20 1220 06/23/20 0400 06/23/20 0858 06/24/20 0044 06/24/20 0634 06/24/20 0855 07/23/20 0421  WBC 8.1  --  7.3  --  6.8  --  8.1 11.8*  LATICACIDVEN 2.5* 2.2*  --  1.3  --  1.8  --   --     Procedures/Operations  As per EMR   Rodman Key  R Joi Leyva 07/12/2020, 7:11 PM

## 2020-07-11 NOTE — Progress Notes (Signed)
Pt expired. Pt was terminally extubated. RN at bedside.

## 2020-07-11 NOTE — Progress Notes (Addendum)
Patient ID: Isaiah Davidson, male   DOB: September 07, 1931, 87 y.o.   MRN: [id number]     Advanced Heart Failure Rounding Note  PCP-Cardiologist: No primary care provider on file.   Subjective:    12/14 Last night developed respiratory distress, hypotension and was intubated. Atrial fibrillation documented in notes but no ECG with this and in NSR today.  HS-TnI [id number] => [id number].  Intregrilin started overnight in addition to heparin gtt.   Limited echo 12/14 on dobutamine: EF 35-40% with mild aortic stenosis (mean gradient 10 mmHg) and no LV thrombus visualized.   On dobutamine 2.5 + norepi 13 mcg. CO-OX 48%. Developed hematuria overnight.   Awake on vent.  Objective:   Weight Range: 73.9 kg Body mass index is 24.06 kg/m.   Vital Signs:   Temp:  [98.9 F (37.2 C)-101 F (38.3 C)] 99.6 F (37.6 C) (12/16 0748) Pulse Rate:  [94-127] 121 (12/16 0645) Resp:  [20-24] 23 (12/16 0645) BP: (42-107)/(31-84) 92/74 (12/16 0645) SpO2:  [76 %-100 %] 100 % (12/16 0645) FiO2 (%):  [40 %] 40 % (12/16 0324) Last BM Date: 06/23/20  Weight change: Filed Weights   06/22/20 0400 06/23/20 0520 06/24/20 0413  Weight: 71.5 kg 74 kg 73.9 kg    Intake/Output:   Intake/Output Summary (Last 24 hours) at 2020/07/23 0805 Last data filed at 23-Jul-2020 0600 Gross per 24 hour  Intake 1987.44 ml  Output 130 ml  Net 1857.44 ml      Physical Exam  CVP 6  General: Intubated.  HEENT: ETT Neck: supple. JVP 6-7 Carotids 2+ bilat; no bruits. No lymphadenopathy or thryomegaly appreciated. Cor: PMI nondisplaced. Regular rate & rhythm. No rubs, gallops or murmurs. Lungs: clear Abdomen: soft, nontender, nondistended. No hepatosplenomegaly. No bruits or masses. Good bowel sounds. Extremities: no cyanosis, clubbing, rash, edema Neuro: Intubated. Following commands. MAE x4.  GU: Foley with bloody urine.   Telemetry   ST 120s with occasional PVCs  EKG    No new EKG to review   Labs    CBC Recent  Labs    06/24/20 0855 07/23/20 0421  WBC 8.1 11.8*  HGB 9.7* 9.2*  HCT 30.2* 28.4*  MCV 94.7 94.4  PLT 173 409   Basic Metabolic Panel Recent Labs    06/24/20 0855 06/24/20 1942 July 23, 2020 0421  NA 135  --  134*  K 4.2  --  3.8  CL 104  --  103  CO2 22  --  19*  GLUCOSE 129*  --  144*  BUN 39*  --  51*  CREATININE 2.31*  --  3.21*  CALCIUM 8.3*  --  7.7*  MG 2.3 2.4 2.3  PHOS 3.6 3.5 3.9   Liver Function Tests No results for input(s): AST, ALT, ALKPHOS, BILITOT, PROT, ALBUMIN in the last 72 hours. No results for input(s): LIPASE, AMYLASE in the last 72 hours. Cardiac Enzymes No results for input(s): CKTOTAL, CKMB, CKMBINDEX, TROPONINI in the last 72 hours.  BNP: BNP (last 3 results) Recent Labs    06/24/20 0855  BNP 4,283.0*    ProBNP (last 3 results) No results for input(s): PROBNP in the last 8760 hours.   D-Dimer No results for input(s): DDIMER in the last 72 hours. Hemoglobin A1C No results for input(s): HGBA1C in the last 72 hours. Fasting Lipid Panel No results for input(s): CHOL, HDL, LDLCALC, TRIG, CHOLHDL, LDLDIRECT in the last 72 hours. Thyroid Function Tests No results for input(s): TSH, T4TOTAL, T3FREE, THYROIDAB in the last  72 hours.  Invalid input(s): FREET3  Other results:   Imaging    No results found.   Medications:     Scheduled Medications: . allopurinol  100 mg Per Tube Daily  . aspirin  81 mg Per Tube Daily  . chlorhexidine gluconate (MEDLINE KIT)  15 mL Mouth Rinse BID  . Chlorhexidine Gluconate Cloth  6 each Topical Daily  . docusate  100 mg Per Tube BID  . donepezil  10 mg Per Tube QHS  . feeding supplement (PROSource TF)  45 mL Per Tube Daily  . fentaNYL (SUBLIMAZE) injection  25 mcg Intravenous Once  . influenza vaccine adjuvanted  0.5 mL Intramuscular Tomorrow-1000  . insulin aspart  2-6 Units Subcutaneous Q4H  . ipratropium-albuterol  3 mL Nebulization Q6H  . levETIRAcetam  500 mg Per Tube BID  . mouth rinse   15 mL Mouth Rinse 10 times per day  . pantoprazole sodium  40 mg Per Tube Daily  . polyethylene glycol  17 g Per Tube Daily  . QUEtiapine  25 mg Per Tube BID  . rosuvastatin  40 mg Per Tube Daily  . sodium chloride flush  3 mL Intravenous Q12H    Infusions: . sodium chloride Stopped (06/23/20 0416)  . dexmedetomidine (PRECEDEX) IV infusion 0.2 mcg/kg/hr (07-14-2020 0600)  . DOBUTamine 5 mcg/kg/min (07-14-20 0600)  . feeding supplement (VITAL AF 1.2 CAL) 40 mL/hr at Jul 14, 2020 0803  . fentaNYL infusion INTRAVENOUS 25 mcg/hr (2020-07-14 0600)  . heparin 1,050 Units/hr (Jul 14, 2020 0600)  . norepinephrine (LEVOPHED) Adult infusion 13 mcg/min (07-14-20 0600)    PRN Medications: sodium chloride, acetaminophen (TYLENOL) oral liquid 160 mg/5 mL, albuterol, fentaNYL, nitroGLYCERIN, ondansetron (ZOFRAN) IV, sodium chloride flush    Assessment/Plan   1. CAD: LHC done with angina and abnormal coronary CTA.  This showed 85% left main stenosis, 70% mRCA, 70% PDA, serial 70-80% mid-distal LCx, 90% D1, 50% mLAD.  Anatomy is best suited for CABG. Discussed films with Dr. Claiborne Billings, he does not have good PCI targets, would require very extensive atherectomy approaching multiple vessels including left main.  12/14-15 overnight respiratory arrest with hypotension, suspect he developed ischemic event with fall in BP and ischemic pulmonary edema.  [place] [id number].  - Continue ASA 81 and Crestor.  - Continue heparin gtt.  - Patient and wife were already reluctant for CABG before respiratory arrest, suspect he would be very high risk even if extubated and re-stabilized (renal dysfunction, abnormal PFTs, CHF, age/frailty).  Suspect our best course here may be get him extubated and have palliative care evaluation.  2. LV thrombus: Smoke in LV apex and concern for thrombus on initial echo.  On repeat echo 12/14, no thrombus visualized.  - Continue heparin gtt for now.  3. Aortic stenosis: 12/14 echo on dobutamine  suggestive of mild aortic stenosis.  4. Acute systolic CHF: Echo 20/94 on dobutamine 2.5 with EF 35-40%, mild AS.  Now s/p respiratory arrest and hypotension with troponin rise.  Co-ox 48%. this morning with normal lactate, he remains on dobutamine 2.5 + norepi 13 mcg.  - Volume status ok. Hold diuretics.  - No ARB/ARNI/spironolactone/digoxin with AKI.  - No beta blocker with low output HF.  5. AKI on CKD stage 3: SCr bumped to 2.79 this admit. Contrast may have played a role as well as low output HF.  Baseline creatinine around 1.4. Creatinine trending up 2.3>3.2 .  - Maintain MAP and CO.  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   7. Acute hypoxemic respiratory failure: Intubated.  Suspect initial ischemic pulmonary edema.  CXR with LLL infiltrate suggests possible aspiration.  - Per CCM.  8. ?Atrial fibrillation: No A fib.  9. Hematuria Off integrillin. Hgb drifting down.   Amy Clegg NP-C  06/27/20 8:05 AM  Agree with the above note.   Patient's wife in discussion with CCM decided on comfort care which is very reasonable given worsening shock and renal failure.  He was extubated and subsequently expired.   Loralie Champagne 06-27-20

## 2020-07-11 NOTE — Progress Notes (Signed)
Patient noted to be asystole on the monitor. On examination patient is without pulse, Respirations, heart or breath sounds. Findings confirmed by a second Therapist, sports.  Patient has expired, Time of death 58. Patient's wife was at bedside.  Roselee Nova, RN

## 2020-07-11 NOTE — Significant Event (Signed)
PCCM INTERVAL PROGRESS NOTE  Long discussion with the patient's wife regarding goals of care. His shock and renal failure are worsening. He has had minimal UOP and what he has is quite bloody. He is not a candidate for PCI or CABG. She would like [place] to try to extubate him with plans of transitioning to comfort care once they have had time to spend together. If he is unable to extubate traditionally, we will pursue compassionate extubation.    Georgann Housekeeper, AGACNP-BC [place]  See Amion for personal pager PCCM on call pager [phone]  07-20-2020 10:16 AM

## 2020-07-11 NOTE — Progress Notes (Signed)
ANTICOAGULATION CONSULT NOTE  Pharmacy Consult for Heparin  Indication: LV thrombus  No Known Allergies  Patient Measurements: Height: 5\' 9"  (175.3 cm) Weight: 73.9 kg (162 lb 14.7 oz) IBW/kg (Calculated) : 70.7 Heparin Dosing Weight: 73 kg  Vital Signs: Temp: 99.6 F (37.6 C) (12/16 0748) Temp Source: Oral (12/16 0748) BP: 91/69 (12/16 0900) Pulse Rate: 123 (12/16 0900)  Labs: Recent Labs    06/24/20 0032 06/24/20 0044 06/24/20 0315 06/24/20 0438 06/24/20 0629 06/24/20 0855 07-03-2020 0421 July 03, 2020 0422  HGB  --  9.5*  --  8.8*  --  9.7* 9.2*  --   HCT  --  28.8*  --  26.0*  --  30.2* 28.4*  --   PLT  --  161  --   --   --  173 182  --   HEPARINUNFRC 0.30  --   --   --   --  0.55  --  0.59  CREATININE  --  1.79* 2.11*  --   --  2.31* 3.21*  --   TROPONINIHS  --   --  93,818*  --  17,343*  --   --   --     Estimated Creatinine Clearance: 16.5 mL/min (A) (by C-G formula based on SCr of 3.21 mg/dL (H)).   Assessment: 87 year old male presented to [facility] for diagnostic cath for ongoing angina and was found to have multivessel disease; awaiting surgical consultation.   Echo showed low EF and LV thrombus  Hep lvl this am within goal range  Cbc stable  s/p Integrilin x 24 hrs  Hematuria Potential plans for comfort care later  Goal of Therapy:  Heparin level 0.3-0.7 units/ml Monitor platelets by anticoagulation protocol: Yes   Plan:  Hold heparin after discussion w ccm  Barth Kirks, PharmD, BCPS, BCCCP Clinical Pharmacist [phone]  Please check AMION for all [place] numbers  07-03-20 10:16 AM

## 2020-07-11 NOTE — Progress Notes (Signed)
PT Cancellation Note  Patient Details Name: Antwoin Lackey MRN: [id number] DOB: [birth year]   Cancelled Treatment:    Reason Eval/Treat Not Completed: Patient not medically ready (pt on vent with poor cardiac tolerance, bloody urine and not currently appropriate for mobility)   Valerie Cones B Senaida Chilcote 06/30/20, 8:20 AM  Bayard Males, PT Acute Rehabilitation Services Pager: [phone] Office: [phone]

## 2020-07-11 DEATH — deceased

## 2020-07-27 ENCOUNTER — Ambulatory Visit: Payer: Medicare HMO

## 2020-09-01 ENCOUNTER — Ambulatory Visit: Payer: Medicare HMO | Admitting: Internal Medicine

## 2020-11-20 ENCOUNTER — Encounter: Payer: Medicare HMO | Admitting: Family Medicine

## 2020-12-31 IMAGING — NM NM SCAN TUMOR LOCALIZE WITH SPECT
2 series · 12 of 12 positions shown · non-contrast
Comparison: none

CLINICAL DATA: Heart failure, hypertension

EXAM:
NUCLEAR MEDICINE TUMOR LOCALIZATION. PYP CARDIAC AMYLOIDOSIS SCAN
WITH SPECT
TECHNIQUE: Following intravenous administration of radiopharmaceutical,
anterior planar images of the chest were obtained. Regions of
interest were placed on the heart and contralateral chest wall for
quantitative assessment. Additional SPECT imaging of the chest was
obtained.
RADIOPHARMACEUTICALS:  21.8 mCi TECHNETIUM 99 PYROPHOSPHATE

[Series 1: spect - (id)_(id)_cor · 4.1mm · 4.14mm/px · 6 of 128 frames shown]
[frame 11/128]
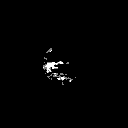
[frame 32/128]
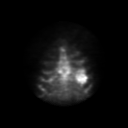
[frame 54/128]
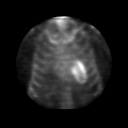
[frame 75/128]
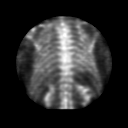
[frame 96/128]
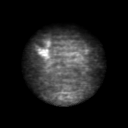
[frame 118/128]
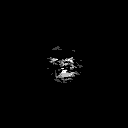

[Series 1: spect - (id)_(id)_tra · 4.1mm · 4.14mm/px · 6 of 128 frames shown]
[frame 11/128]
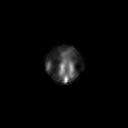
[frame 32/128]
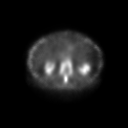
[frame 54/128]
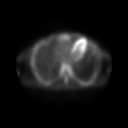
[frame 75/128]
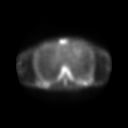
[frame 96/128]
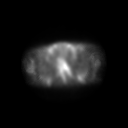
[frame 118/128]
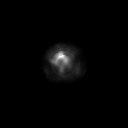

[12 of 12 positions shown; findings below may reference images not displayed]

FINDINGS: Planar Visual assessment:

Anterior planar imaging demonstrates radiotracer uptake within the
heart greater than uptake within the adjacent ribs (Grade 3).

Quantitative assessment :

Quantitative assessment of the cardiac uptake compared to the
contralateral chest wall is equal to 2.17 (H/CL = 2.17).

SPECT assessment: SPECT imaging of the chest demonstrates clear
radiotracer accumulation within the LEFT ventricle.
IMPRESSION: Visual and quantitative assessment (grade 3, H/CL = 2.17) are
strongly suggestive of transthyretin amyloidosis.

## 2021-01-01 IMAGING — DX DG ABD PORTABLE 1V
1 series · 1 of 1 positions shown · non-contrast
Comparison: None.

CLINICAL DATA: OG tube placement

EXAM:
PORTABLE ABDOMEN - 1 VIEW

[abdomen kub]
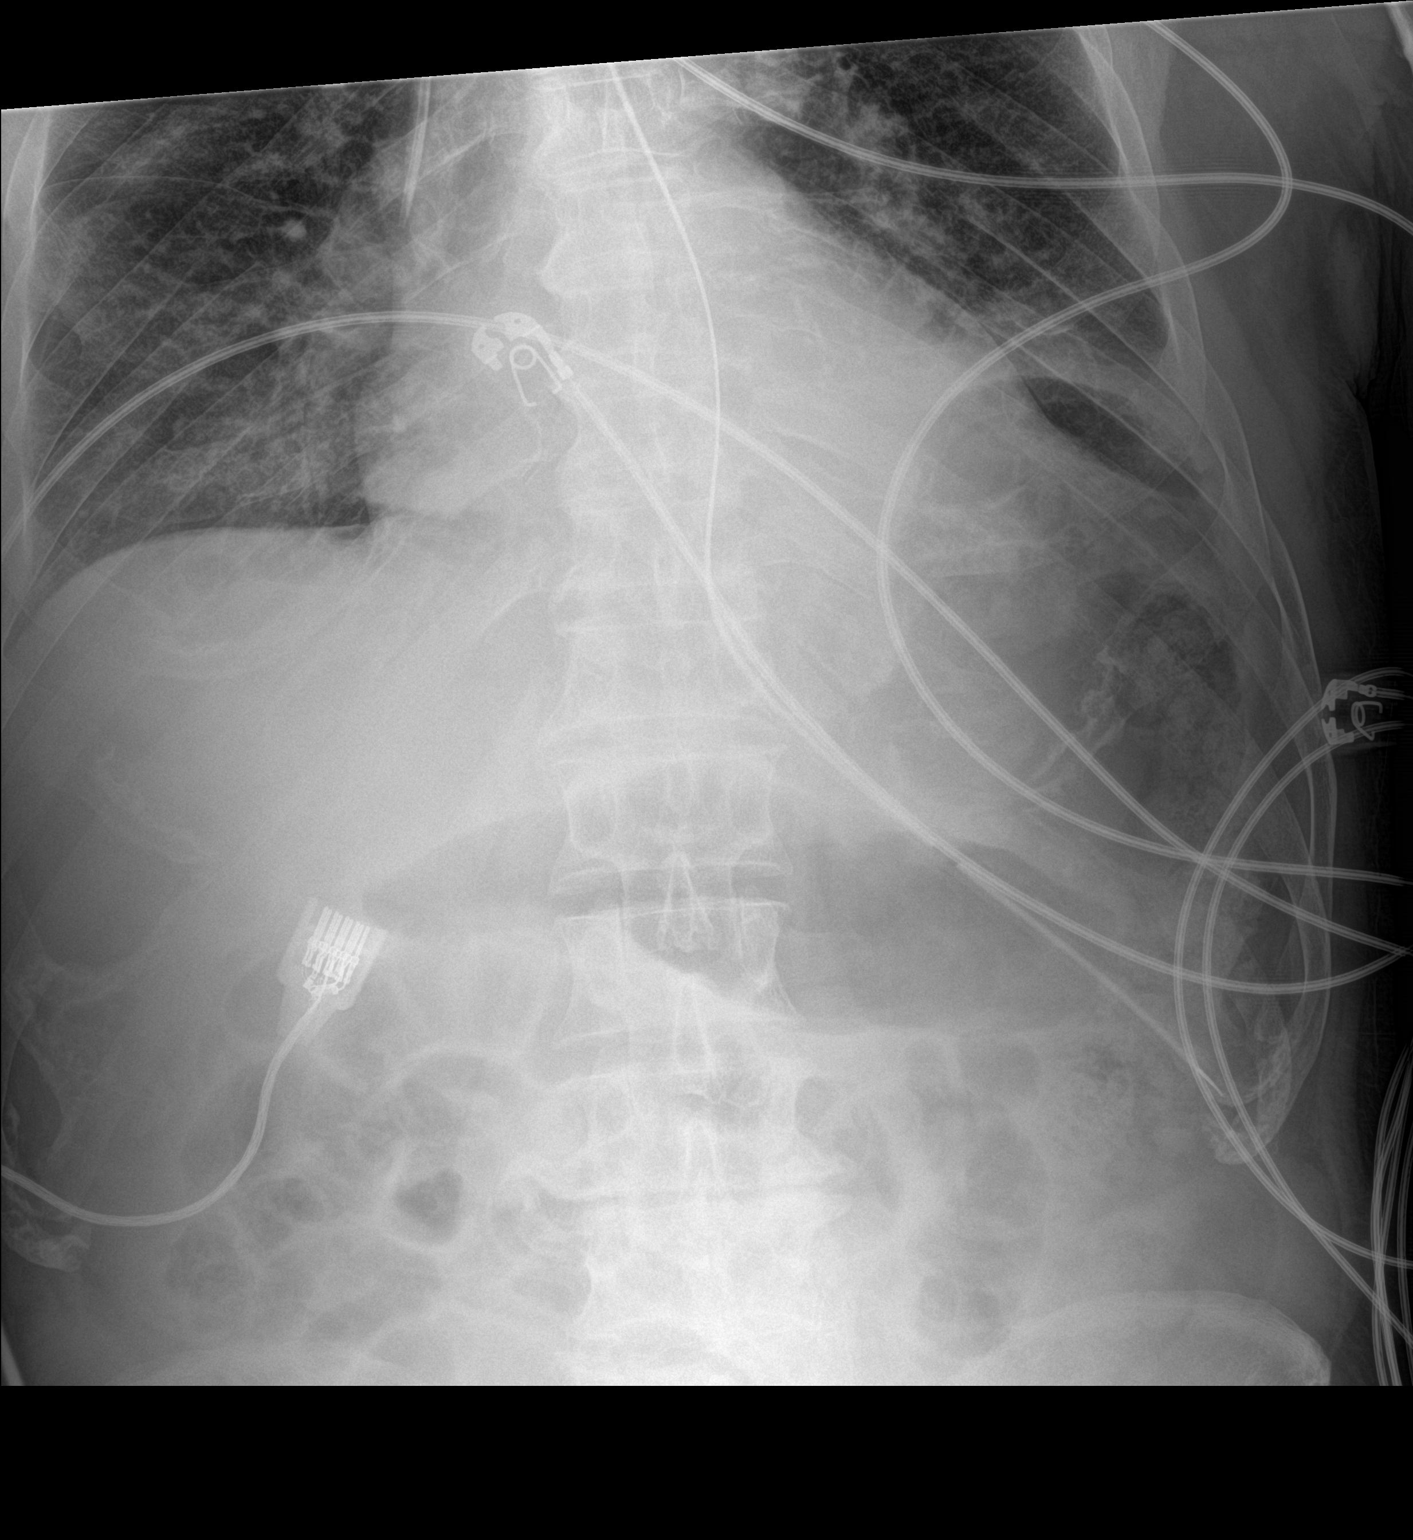

[1 of 1 positions shown; findings below may reference images not displayed]

FINDINGS: OG tube tip is in the stomach. Nonobstructed bowel gas pattern. No
free air.
IMPRESSION: OG tube tip in the stomach.

## 2021-06-08 ENCOUNTER — Ambulatory Visit: Payer: Medicare HMO | Admitting: Adult Health
